# Patient Record
Sex: Male | Born: 1955 | Race: White | Hispanic: No | Smoking: Current every day smoker
Health system: Southern US, Community
[De-identification: ages and names within clinical notes are randomized; demographics above are authoritative.]

## PROBLEM LIST (undated history)

## (undated) DIAGNOSIS — E785 Hyperlipidemia, unspecified: Secondary | ICD-10-CM

## (undated) DIAGNOSIS — Z8719 Personal history of other diseases of the digestive system: Secondary | ICD-10-CM

## (undated) DIAGNOSIS — I10 Essential (primary) hypertension: Secondary | ICD-10-CM

## (undated) DIAGNOSIS — Z720 Tobacco use: Secondary | ICD-10-CM

## (undated) DIAGNOSIS — Z87891 Personal history of nicotine dependence: Secondary | ICD-10-CM

## (undated) DIAGNOSIS — Z951 Presence of aortocoronary bypass graft: Secondary | ICD-10-CM

## (undated) DIAGNOSIS — I2510 Atherosclerotic heart disease of native coronary artery without angina pectoris: Secondary | ICD-10-CM

## (undated) DIAGNOSIS — T82855A Stenosis of coronary artery stent, initial encounter: Secondary | ICD-10-CM

## (undated) DIAGNOSIS — N529 Male erectile dysfunction, unspecified: Secondary | ICD-10-CM

## (undated) DIAGNOSIS — Z87898 Personal history of other specified conditions: Secondary | ICD-10-CM

## (undated) DIAGNOSIS — I2102 ST elevation (STEMI) myocardial infarction involving left anterior descending coronary artery: Secondary | ICD-10-CM

## (undated) HISTORY — DX: Stenosis of coronary artery stent, initial encounter: T82.855A

## (undated) HISTORY — DX: Personal history of other diseases of the digestive system: Z87.19

## (undated) HISTORY — PX: NM MYOVIEW LTD: HXRAD82

## (undated) HISTORY — DX: Personal history of nicotine dependence: Z87.891

## (undated) HISTORY — DX: Presence of aortocoronary bypass graft: Z95.1

## (undated) HISTORY — DX: Atherosclerotic heart disease of native coronary artery without angina pectoris: I25.10

## (undated) HISTORY — DX: Personal history of other specified conditions: Z87.898

## (undated) HISTORY — DX: ST elevation (STEMI) myocardial infarction involving left anterior descending coronary artery: I21.02

## (undated) HISTORY — DX: Male erectile dysfunction, unspecified: N52.9

## (undated) HISTORY — DX: Hyperlipidemia, unspecified: E78.5

## (undated) HISTORY — DX: Tobacco use: Z72.0

---

## 2003-01-03 HISTORY — PX: COLONOSCOPY: SHX174

## 2003-03-24 ENCOUNTER — Encounter: Admission: RE | Admit: 2003-03-24 | Discharge: 2003-03-24 | Payer: Self-pay | Admitting: Family Medicine

## 2003-10-03 DIAGNOSIS — I2510 Atherosclerotic heart disease of native coronary artery without angina pectoris: Secondary | ICD-10-CM

## 2003-10-03 DIAGNOSIS — I2102 ST elevation (STEMI) myocardial infarction involving left anterior descending coronary artery: Secondary | ICD-10-CM

## 2003-10-03 HISTORY — DX: ST elevation (STEMI) myocardial infarction involving left anterior descending coronary artery: I21.02

## 2003-10-03 HISTORY — DX: Atherosclerotic heart disease of native coronary artery without angina pectoris: I25.10

## 2003-10-26 ENCOUNTER — Inpatient Hospital Stay (HOSPITAL_COMMUNITY): Admission: EM | Admit: 2003-10-26 | Discharge: 2003-10-29 | Payer: Self-pay | Admitting: Emergency Medicine

## 2003-10-26 HISTORY — PX: CORONARY ANGIOPLASTY WITH STENT PLACEMENT: SHX49

## 2003-11-16 ENCOUNTER — Encounter (HOSPITAL_COMMUNITY): Admission: RE | Admit: 2003-11-16 | Discharge: 2004-02-14 | Payer: Self-pay | Admitting: *Deleted

## 2004-02-15 ENCOUNTER — Encounter (HOSPITAL_COMMUNITY): Admission: RE | Admit: 2004-02-15 | Discharge: 2004-05-15 | Payer: Self-pay | Admitting: *Deleted

## 2004-04-18 ENCOUNTER — Ambulatory Visit: Payer: Self-pay | Admitting: *Deleted

## 2004-07-04 ENCOUNTER — Ambulatory Visit: Payer: Self-pay | Admitting: Cardiology

## 2004-10-13 ENCOUNTER — Ambulatory Visit: Payer: Self-pay | Admitting: Cardiology

## 2005-01-12 ENCOUNTER — Ambulatory Visit: Payer: Self-pay | Admitting: Cardiology

## 2007-03-13 ENCOUNTER — Emergency Department (HOSPITAL_COMMUNITY): Admission: EM | Admit: 2007-03-13 | Discharge: 2007-03-13 | Payer: Self-pay | Admitting: Emergency Medicine

## 2009-04-02 DIAGNOSIS — I2510 Atherosclerotic heart disease of native coronary artery without angina pectoris: Secondary | ICD-10-CM

## 2009-04-02 DIAGNOSIS — Z951 Presence of aortocoronary bypass graft: Secondary | ICD-10-CM | POA: Insufficient documentation

## 2009-04-02 HISTORY — PX: DOPPLER ECHOCARDIOGRAPHY: SHX263

## 2009-04-02 HISTORY — PX: CORONARY ARTERY BYPASS GRAFT: SHX141

## 2009-04-02 HISTORY — DX: Atherosclerotic heart disease of native coronary artery without angina pectoris: I25.10

## 2009-04-02 HISTORY — DX: Presence of aortocoronary bypass graft: Z95.1

## 2009-04-22 ENCOUNTER — Encounter: Admission: RE | Admit: 2009-04-22 | Discharge: 2009-04-22 | Payer: Self-pay | Admitting: Cardiology

## 2009-04-28 ENCOUNTER — Inpatient Hospital Stay (HOSPITAL_COMMUNITY): Admission: RE | Admit: 2009-04-28 | Discharge: 2009-04-29 | Payer: Self-pay | Admitting: Cardiology

## 2009-04-28 HISTORY — PX: CARDIAC CATHETERIZATION: SHX172

## 2009-04-29 ENCOUNTER — Ambulatory Visit: Payer: Self-pay | Admitting: Thoracic Surgery (Cardiothoracic Vascular Surgery)

## 2009-04-29 ENCOUNTER — Encounter: Payer: Self-pay | Admitting: Thoracic Surgery (Cardiothoracic Vascular Surgery)

## 2009-05-04 ENCOUNTER — Inpatient Hospital Stay (HOSPITAL_COMMUNITY)
Admission: RE | Admit: 2009-05-04 | Discharge: 2009-05-08 | Payer: Self-pay | Admitting: Thoracic Surgery (Cardiothoracic Vascular Surgery)

## 2009-05-26 ENCOUNTER — Encounter
Admission: RE | Admit: 2009-05-26 | Discharge: 2009-05-26 | Payer: Self-pay | Admitting: Thoracic Surgery (Cardiothoracic Vascular Surgery)

## 2009-05-26 ENCOUNTER — Ambulatory Visit: Payer: Self-pay | Admitting: Thoracic Surgery (Cardiothoracic Vascular Surgery)

## 2009-05-27 ENCOUNTER — Encounter (HOSPITAL_COMMUNITY): Admission: RE | Admit: 2009-05-27 | Discharge: 2009-08-25 | Payer: Self-pay | Admitting: Cardiology

## 2010-03-22 LAB — BASIC METABOLIC PANEL
BUN: 12 mg/dL (ref 6–23)
CO2: 26 mEq/L (ref 19–32)
CO2: 24 mEq/L (ref 19–32)
Chloride: 106 mEq/L (ref 96–112)
Chloride: 112 mEq/L (ref 96–112)
Chloride: 110 mEq/L (ref 96–112)
Creatinine, Ser: 1.06 mg/dL (ref 0.4–1.5)
GFR calc Af Amer: >60 mL/min (ref >60)
GFR calc Af Amer: >60 mL/min (ref >60)
GFR calc non Af Amer: >60 mL/min (ref >60)
Glucose, Bld: 103 mg/dL — ABNORMAL HIGH (ref 70–99)
Potassium: 4 mEq/L (ref 3.5–5.1)
Sodium: 141 mEq/L (ref 135–145)
Sodium: 139 mEq/L (ref 135–145)

## 2010-03-22 LAB — SURGICAL PCR SCREEN
MRSA, PCR: NEGATIVE
Staphylococcus aureus: NEGATIVE

## 2010-03-22 LAB — CBC
HCT: 36.2 % — ABNORMAL LOW (ref 39.0–52.0)
HCT: 37.4 % — ABNORMAL LOW (ref 39.0–52.0)
HCT: 37.5 % — ABNORMAL LOW (ref 39.0–52.0)
HCT: 44.8 % (ref 39.0–52.0)
Hemoglobin: 12.3 g/dL — ABNORMAL LOW (ref 13.0–17.0)
Hemoglobin: 13 g/dL (ref 13.0–17.0)
Hemoglobin: 13.3 g/dL (ref 13.0–17.0)
Hemoglobin: 15.2 g/dL (ref 13.0–17.0)
MCHC: 34.1 g/dL (ref 30.0–36.0)
MCHC: 34.8 g/dL (ref 30.0–36.0)
MCHC: 34 g/dL (ref 30.0–36.0)
MCV: 84.8 fL (ref 78.0–100.0)
MCV: 85.2 fL (ref 78.0–100.0)
MCV: 86.2 fL (ref 78.0–100.0)
MCV: 86.3 fL (ref 78.0–100.0)
MCV: 86.7 fL (ref 78.0–100.0)
MCV: 86.2 fL (ref 78.0–100.0)
Platelets: 141 10*3/uL — ABNORMAL LOW (ref 150–400)
Platelets: 146 10*3/uL — ABNORMAL LOW (ref 150–400)
Platelets: 171 10*3/uL (ref 150–400)
RBC: 4.17 MIL/uL — ABNORMAL LOW (ref 4.22–5.81)
RBC: 4.19 MIL/uL — ABNORMAL LOW (ref 4.22–5.81)
RBC: 4.19 MIL/uL — ABNORMAL LOW (ref 4.22–5.81)
RBC: 4.39 MIL/uL (ref 4.22–5.81)
RDW: 14 % (ref 11.5–15.5)
RDW: 14.1 % (ref 11.5–15.5)
WBC: 10 10*3/uL (ref 4.0–10.5)
WBC: 4.2 10*3/uL (ref 4.0–10.5)
WBC: 7.4 10*3/uL (ref 4.0–10.5)
WBC: 9.1 10*3/uL (ref 4.0–10.5)

## 2010-03-22 LAB — POCT I-STAT 3, ART BLOOD GAS (G3+)
Bicarbonate: 22.4 mEq/L (ref 20.0–24.0)
Bicarbonate: 23.9 mEq/L (ref 20.0–24.0)
O2 Saturation: 96 %
O2 Saturation: 97 %
O2 Saturation: 98 %
Patient temperature: 33.6
Patient temperature: 37.7
TCO2: 24 mmol/L (ref 0–100)
TCO2: 25 mmol/L (ref 0–100)
TCO2: 26 mmol/L (ref 0–100)
pCO2 arterial: 46.7 mmHg — ABNORMAL HIGH (ref 35.0–45.0)
pCO2 arterial: 46.8 mmHg — ABNORMAL HIGH (ref 35.0–45.0)
pH, Arterial: 7.336 — ABNORMAL LOW (ref 7.350–7.450)
pO2, Arterial: 114 mmHg — ABNORMAL HIGH (ref 80.0–100.0)

## 2010-03-22 LAB — CREATININE, SERUM
GFR calc Af Amer: >60 mL/min (ref >60)
GFR calc non Af Amer: >60 mL/min (ref >60)
GFR calc non Af Amer: >60 mL/min (ref >60)

## 2010-03-22 LAB — GLUCOSE, CAPILLARY
Glucose-Capillary: 109 mg/dL — ABNORMAL HIGH (ref 70–99)
Glucose-Capillary: 137 mg/dL — ABNORMAL HIGH (ref 70–99)

## 2010-03-22 LAB — HEPATIC FUNCTION PANEL
ALT: 33 U/L (ref 0–53)
AST: 23 U/L (ref 0–37)
Albumin: 3.5 g/dL (ref 3.5–5.2)
Bilirubin, Direct: 0.2 mg/dL (ref 0.0–0.3)
Total Protein: 6 g/dL (ref 6.0–8.3)

## 2010-03-22 LAB — POCT I-STAT, CHEM 8
BUN: 11 mg/dL (ref 6–23)
Chloride: 108 mEq/L (ref 96–112)
Hemoglobin: 13.3 g/dL (ref 13.0–17.0)
Potassium: 3.8 mEq/L (ref 3.5–5.1)
TCO2: 24 mmol/L (ref 0–100)

## 2010-03-22 LAB — POCT I-STAT 4, (NA,K, GLUC, HGB,HCT)
Glucose, Bld: 111 mg/dL — ABNORMAL HIGH (ref 70–99)
Glucose, Bld: 117 mg/dL — ABNORMAL HIGH (ref 70–99)
Glucose, Bld: 122 mg/dL — ABNORMAL HIGH (ref 70–99)
Glucose, Bld: 125 mg/dL — ABNORMAL HIGH (ref 70–99)
HCT: 40 % (ref 39.0–52.0)
HCT: 41 % (ref 39.0–52.0)
HCT: 44 % (ref 39.0–52.0)
Hemoglobin: 13.6 g/dL (ref 13.0–17.0)
Hemoglobin: 13.9 g/dL (ref 13.0–17.0)
Hemoglobin: 15 g/dL (ref 13.0–17.0)
Potassium: 4 mEq/L (ref 3.5–5.1)
Potassium: 4 mEq/L (ref 3.5–5.1)
Potassium: 4.2 mEq/L (ref 3.5–5.1)
Potassium: 4.4 mEq/L (ref 3.5–5.1)
Sodium: 139 mEq/L (ref 135–145)
Sodium: 139 mEq/L (ref 135–145)
Sodium: 139 mEq/L (ref 135–145)
Sodium: 140 mEq/L (ref 135–145)

## 2010-03-22 LAB — BLOOD GAS, ARTERIAL
Acid-base deficit: 2.5 mmol/L — ABNORMAL HIGH (ref 0.0–2.0)
Bicarbonate: 21.4 mEq/L (ref 20.0–24.0)
Drawn by: 206361
FIO2: 0.21 %
O2 Saturation: 98 %
Patient temperature: 98.6
TCO2: 22.4 mmol/L (ref 0–100)
pCO2 arterial: 34.2 mmHg — ABNORMAL LOW (ref 35.0–45.0)
pH, Arterial: 7.413 (ref 7.350–7.450)
pO2, Arterial: 98.3 mmHg (ref 80.0–100.0)

## 2010-03-22 LAB — MAGNESIUM
Magnesium: 2.5 mg/dL (ref 1.5–2.5)
Magnesium: 2.8 mg/dL — ABNORMAL HIGH (ref 1.5–2.5)

## 2010-03-22 LAB — HEMOGLOBIN A1C
Hgb A1c MFr Bld: 5.5 % (ref <5.7)
Mean Plasma Glucose: 111 mg/dL (ref <117)

## 2010-03-22 LAB — URINALYSIS, ROUTINE W REFLEX MICROSCOPIC
Bilirubin Urine: NEGATIVE
Glucose, UA: NEGATIVE mg/dL
Hgb urine dipstick: NEGATIVE
Ketones, ur: NEGATIVE mg/dL
Nitrite: NEGATIVE
Protein, ur: NEGATIVE mg/dL
Specific Gravity, Urine: 1.018 (ref 1.005–1.030)
Urobilinogen, UA: 0.2 mg/dL (ref 0.0–1.0)
pH: 7 (ref 5.0–8.0)

## 2010-03-22 LAB — TYPE AND SCREEN
ABO/RH(D): O POS
Antibody Screen: NEGATIVE

## 2010-03-22 LAB — APTT
aPTT: 26 seconds (ref 24–37)
aPTT: 33 seconds (ref 24–37)

## 2010-03-22 LAB — PROTIME-INR
INR: 1.05 (ref 0.00–1.49)
INR: 1.24 (ref 0.00–1.49)
Prothrombin Time: 13.6 seconds (ref 11.6–15.2)

## 2010-03-22 LAB — ABO/RH: ABO/RH(D): O POS

## 2010-03-22 LAB — MRSA PCR SCREENING: MRSA by PCR: NEGATIVE

## 2010-05-17 NOTE — Assessment & Plan Note (Signed)
OFFICE VISIT   ALDWIN, MICALIZZI  DOB:  14-Mar-1955                                        May 26, 2009  CHART #:  [id number]   HISTORY:  The patient is a 55 year old gentleman who underwent coronary  bypass grafting x2 on May 04, 2009.  He has a history of coronary disease  dating back to 2005.  He had stents placed at that time.  He presented  with a positive Cardiolite study.  He was found to have tight in-stent  restenosis in his LAD and moderate disease in his right coronary.  He  underwent coronary bypass grafting, off-pump on May 04, 2009.  Postoperatively, he did well without any complications.  He now returns  for a scheduled followup visit.  The patient says that he has some  discomfort primarily in the upper portion of his sternal incision and in  the right shoulder.  This is worse when he gets up in the morning and  tends to get better through the day.  He has not had any anginal-type  discomfort.  He is anxious to return to work.  He drives a dump truck.   CURRENT MEDICATIONS:  1. Aspirin 325 mg daily.  2. Metoprolol XL 25 mg daily.  3. Benazepril 20 mg daily.  4. Niaspan 1000 mg nightly.  5. Protonix 40 mg daily.  6. Simvastatin 40 mg nightly.  7. Oxycodone 5 mg tablets 1-2 p.r.n. for pain.   ALLERGIES:  He has no known drug allergies.   REVIEW OF SYSTEMS:  See HPI, otherwise negative.   PHYSICAL EXAMINATION:  General:  The patient is a 55 year old gentleman,  in no acute distress.  Vital Signs:  His blood pressure is 118/75, pulse  72, respirations are 18, and his oxygen saturation is 97% on room air.  Neurological:  He is alert and oriented x3 with no deficits.  Chest:  His sternal wound is clean, dry, and intact.  His sternum is stable.  Leg incisions are healing well with no peripheral edema.  Cardiac:  Regular rate and rhythm.  Normal S1 and S2.  No rubs or murmurs.  Lungs:  Clear with equal breath sounds bilaterally.  Extremities:   His right  shoulder has good range of motion, no deformity.   DIAGNOSTIC TESTS:  Chest x-ray shows good aeration of the lungs  bilaterally.  There are postoperative changes, but no effusions or  infiltrates.   IMPRESSION:  The patient is a 55 year old gentleman status post coronary  artery bypass grafting.  He is about 3 weeks out from surgery now.  I  cautioned him to avoid lifting any objects greater than 10 pounds for  another 3 weeks.  I think it is okay for him to start driving his  personal vehicle, but advised him not to try and drive his dump truck  for at least another 3 weeks.  His personal driving should be cautious,  as reaction time may not be 100% appropriate.  Precautions were  discussed.  I did give him a prescription for additional oxycodone 5 mg  tablets 1-2 three times daily as needed for pain.  I cautioned him not  to drive while using the oxycodone.  I have encouraged him to continue  with his exercise.  He is doing about 30 minutes a day presently.  He is  going to start cardiac rehab.  He is otherwise doing extremely well.  He  does need a followup appointment with Dr. Garen Lah.  We will help him to  set that up.  I will be happy to see the patient back anytime if I can  be of any further assistance with his care.   Salvatore Decent Dorris Fetch, M.D.  Electronically Signed   SCH/MEDQ  D:  05/26/2009  T:  05/27/2009  Job:  [id number]   cc:   Sheliah Mends, MD
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
## 2010-08-17 ENCOUNTER — Emergency Department (HOSPITAL_COMMUNITY): Payer: 59

## 2010-08-17 ENCOUNTER — Encounter (HOSPITAL_COMMUNITY): Payer: Self-pay

## 2010-08-17 ENCOUNTER — Emergency Department (HOSPITAL_COMMUNITY)
Admission: EM | Admit: 2010-08-17 | Discharge: 2010-08-17 | Disposition: A | Discharge status: Home / Self Care | Payer: 59 | Attending: Emergency Medicine | Admitting: Emergency Medicine

## 2010-08-17 DIAGNOSIS — E7800 Pure hypercholesterolemia, unspecified: Secondary | ICD-10-CM | POA: Insufficient documentation

## 2010-08-17 DIAGNOSIS — R61 Generalized hyperhidrosis: Secondary | ICD-10-CM | POA: Insufficient documentation

## 2010-08-17 DIAGNOSIS — Z79899 Other long term (current) drug therapy: Secondary | ICD-10-CM | POA: Insufficient documentation

## 2010-08-17 DIAGNOSIS — I252 Old myocardial infarction: Secondary | ICD-10-CM | POA: Insufficient documentation

## 2010-08-17 DIAGNOSIS — H81399 Other peripheral vertigo, unspecified ear: Secondary | ICD-10-CM | POA: Insufficient documentation

## 2010-08-17 DIAGNOSIS — R110 Nausea: Secondary | ICD-10-CM | POA: Insufficient documentation

## 2010-08-17 DIAGNOSIS — I10 Essential (primary) hypertension: Secondary | ICD-10-CM | POA: Insufficient documentation

## 2010-08-17 HISTORY — DX: Essential (primary) hypertension: I10

## 2010-08-17 LAB — CBC
MCHC: 35.2 g/dL (ref 30.0–36.0)
MCV: 83.1 fL (ref 78.0–100.0)
Platelets: 188 10*3/uL (ref 150–400)
RDW: 13.1 % (ref 11.5–15.5)
WBC: 5.3 10*3/uL (ref 4.0–10.5)

## 2010-08-17 LAB — COMPREHENSIVE METABOLIC PANEL
ALT: 40 U/L (ref 0–53)
CO2: 22 mEq/L (ref 19–32)
Calcium: 9.4 mg/dL (ref 8.4–10.5)
Creatinine, Ser: 0.74 mg/dL (ref 0.50–1.35)
GFR calc Af Amer: >60 mL/min (ref >60)
GFR calc non Af Amer: >60 mL/min (ref >60)
Glucose, Bld: 99 mg/dL (ref 70–99)
Sodium: 136 mEq/L (ref 135–145)
Total Protein: 7.1 g/dL (ref 6.0–8.3)

## 2010-08-17 LAB — POCT I-STAT TROPONIN I: Troponin i, poc: 0 ng/mL (ref 0.00–0.08)

## 2010-08-17 LAB — DIFFERENTIAL
Basophils Absolute: 0 10*3/uL (ref 0.0–0.1)
Eosinophils Absolute: 0.1 10*3/uL (ref 0.0–0.7)
Eosinophils Relative: 1 % (ref 0–5)
Monocytes Absolute: 0.8 10*3/uL (ref 0.1–1.0)

## 2010-08-17 LAB — URINALYSIS, ROUTINE W REFLEX MICROSCOPIC
Bilirubin Urine: NEGATIVE
Leukocytes, UA: NEGATIVE
Nitrite: NEGATIVE
Specific Gravity, Urine: 1.01 (ref 1.005–1.030)
Urobilinogen, UA: 0.2 mg/dL (ref 0.0–1.0)
pH: 6 (ref 5.0–8.0)

## 2010-08-26 IMAGING — CR DG CHEST 1V
1 series · 1 of 1 positions shown · non-contrast
Comparison: 05/06/2009.

CLINICAL DATA: Post left thoracentesis.

CHEST - 1 VIEW

[w chest pa]
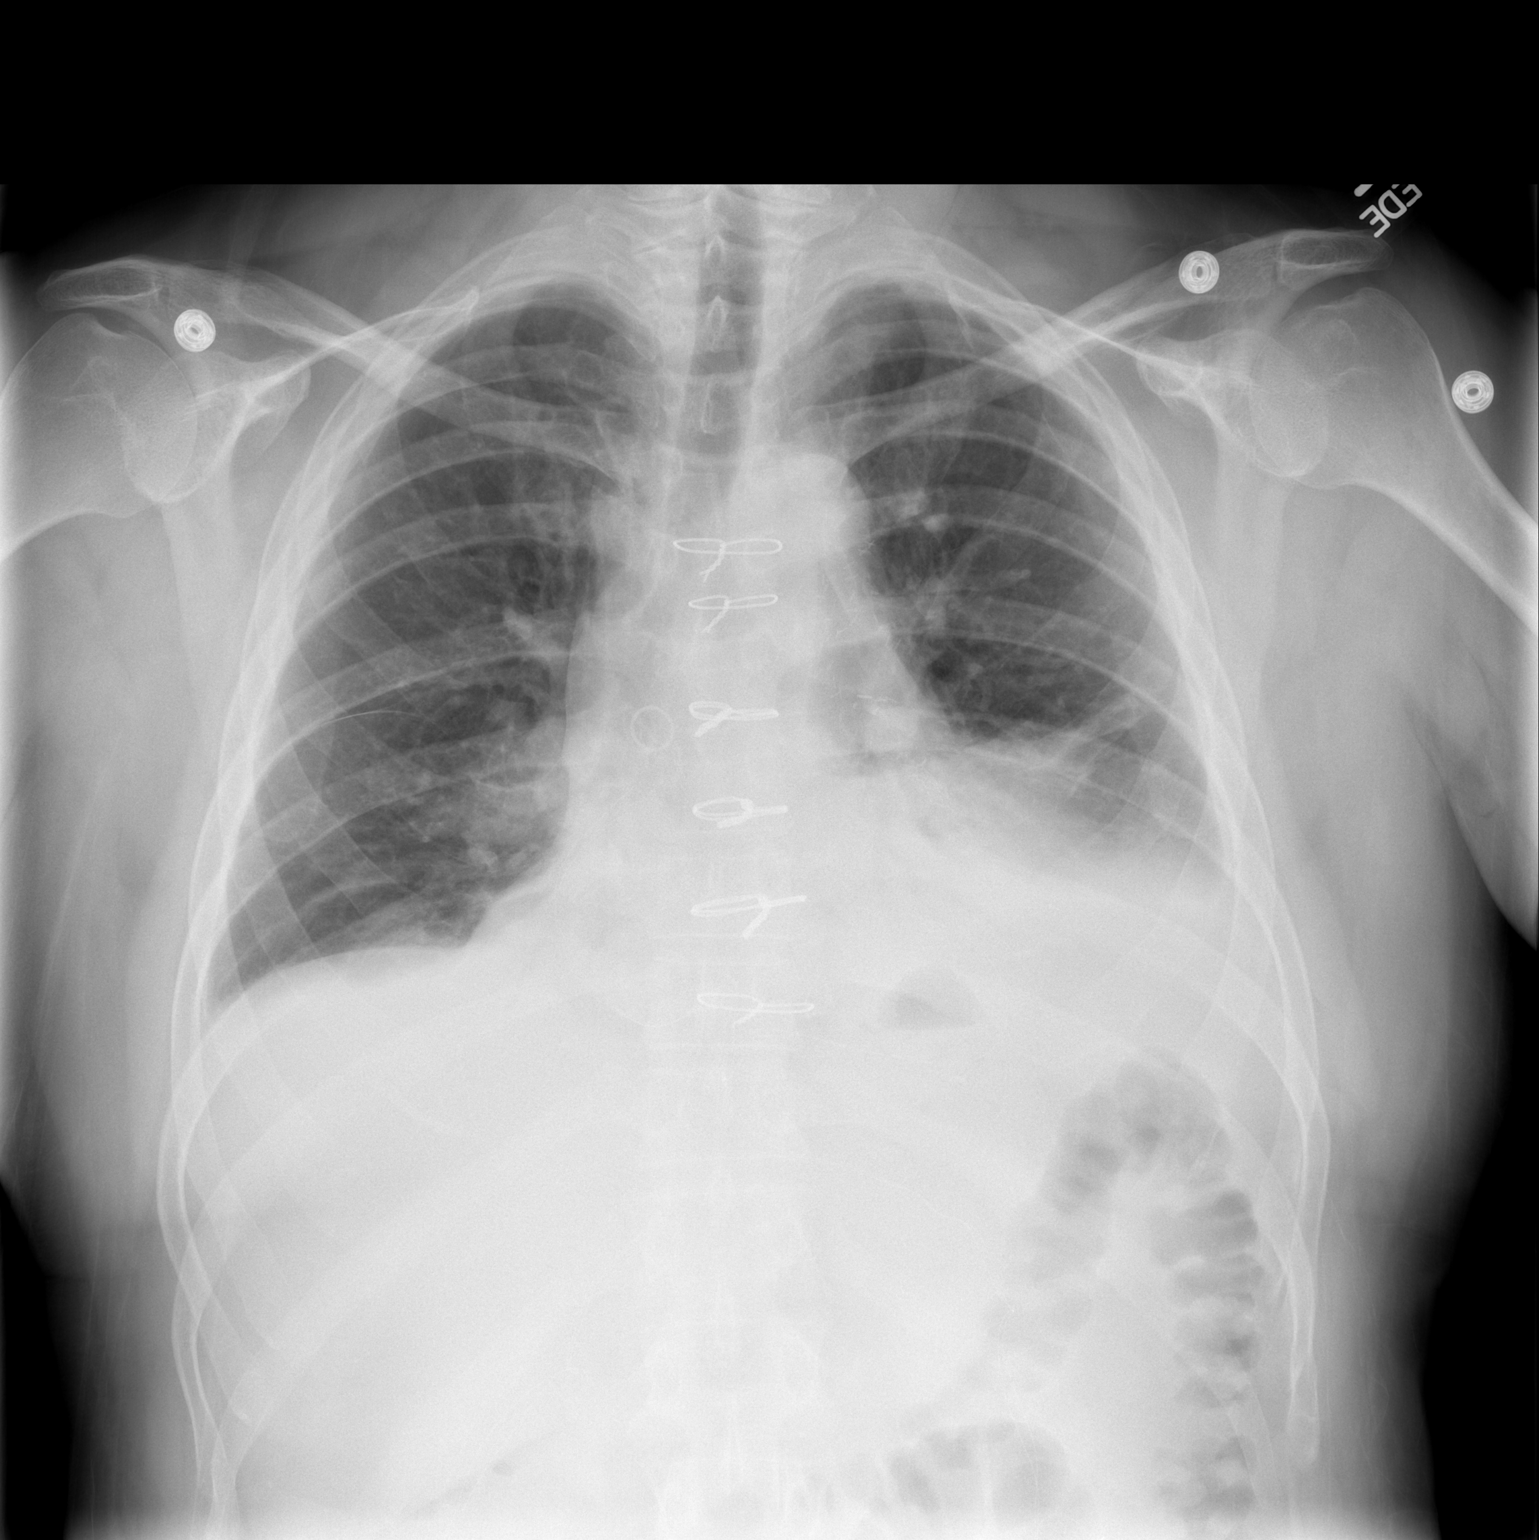

[1 of 1 positions shown; findings below may reference images not displayed]

FINDINGS: Decrease in left effusion.  There remains left pleural
effusion and left lower lobe atelectasis.  Negative for
pneumothorax.

Mild right lower lobe atelectasis is unchanged.  No significant
effusion on the right.
IMPRESSION: Decrease in left effusion post thoracentesis.  Negative for
pneumothorax.

## 2010-08-26 IMAGING — CR DG CHEST 2V
2 series · 2 of 2 positions shown · non-contrast
Comparison: Chest 05/05/2009.

CLINICAL DATA: Status post CABG.

CHEST - 2 VIEW

[w chest pa]
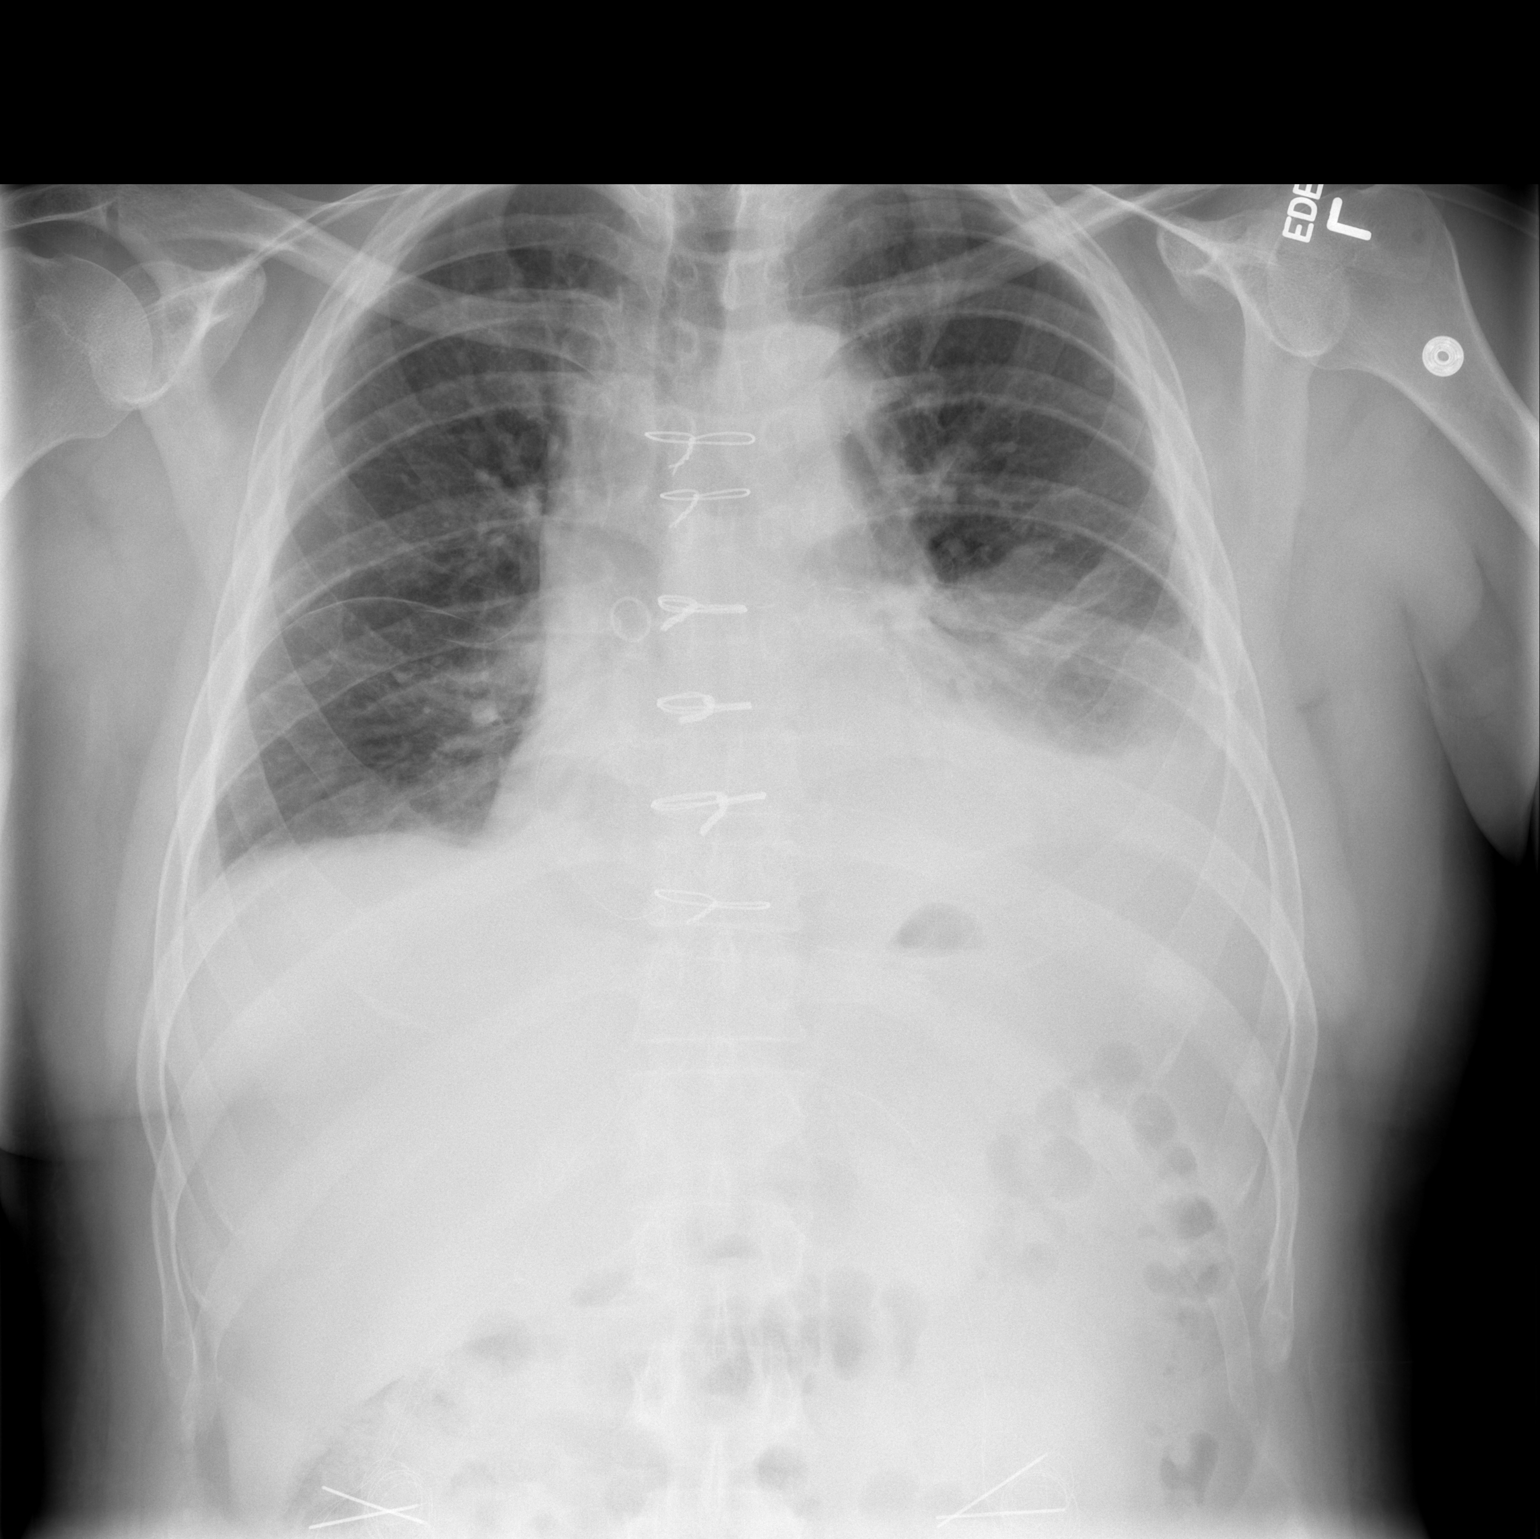

[w chest lat]
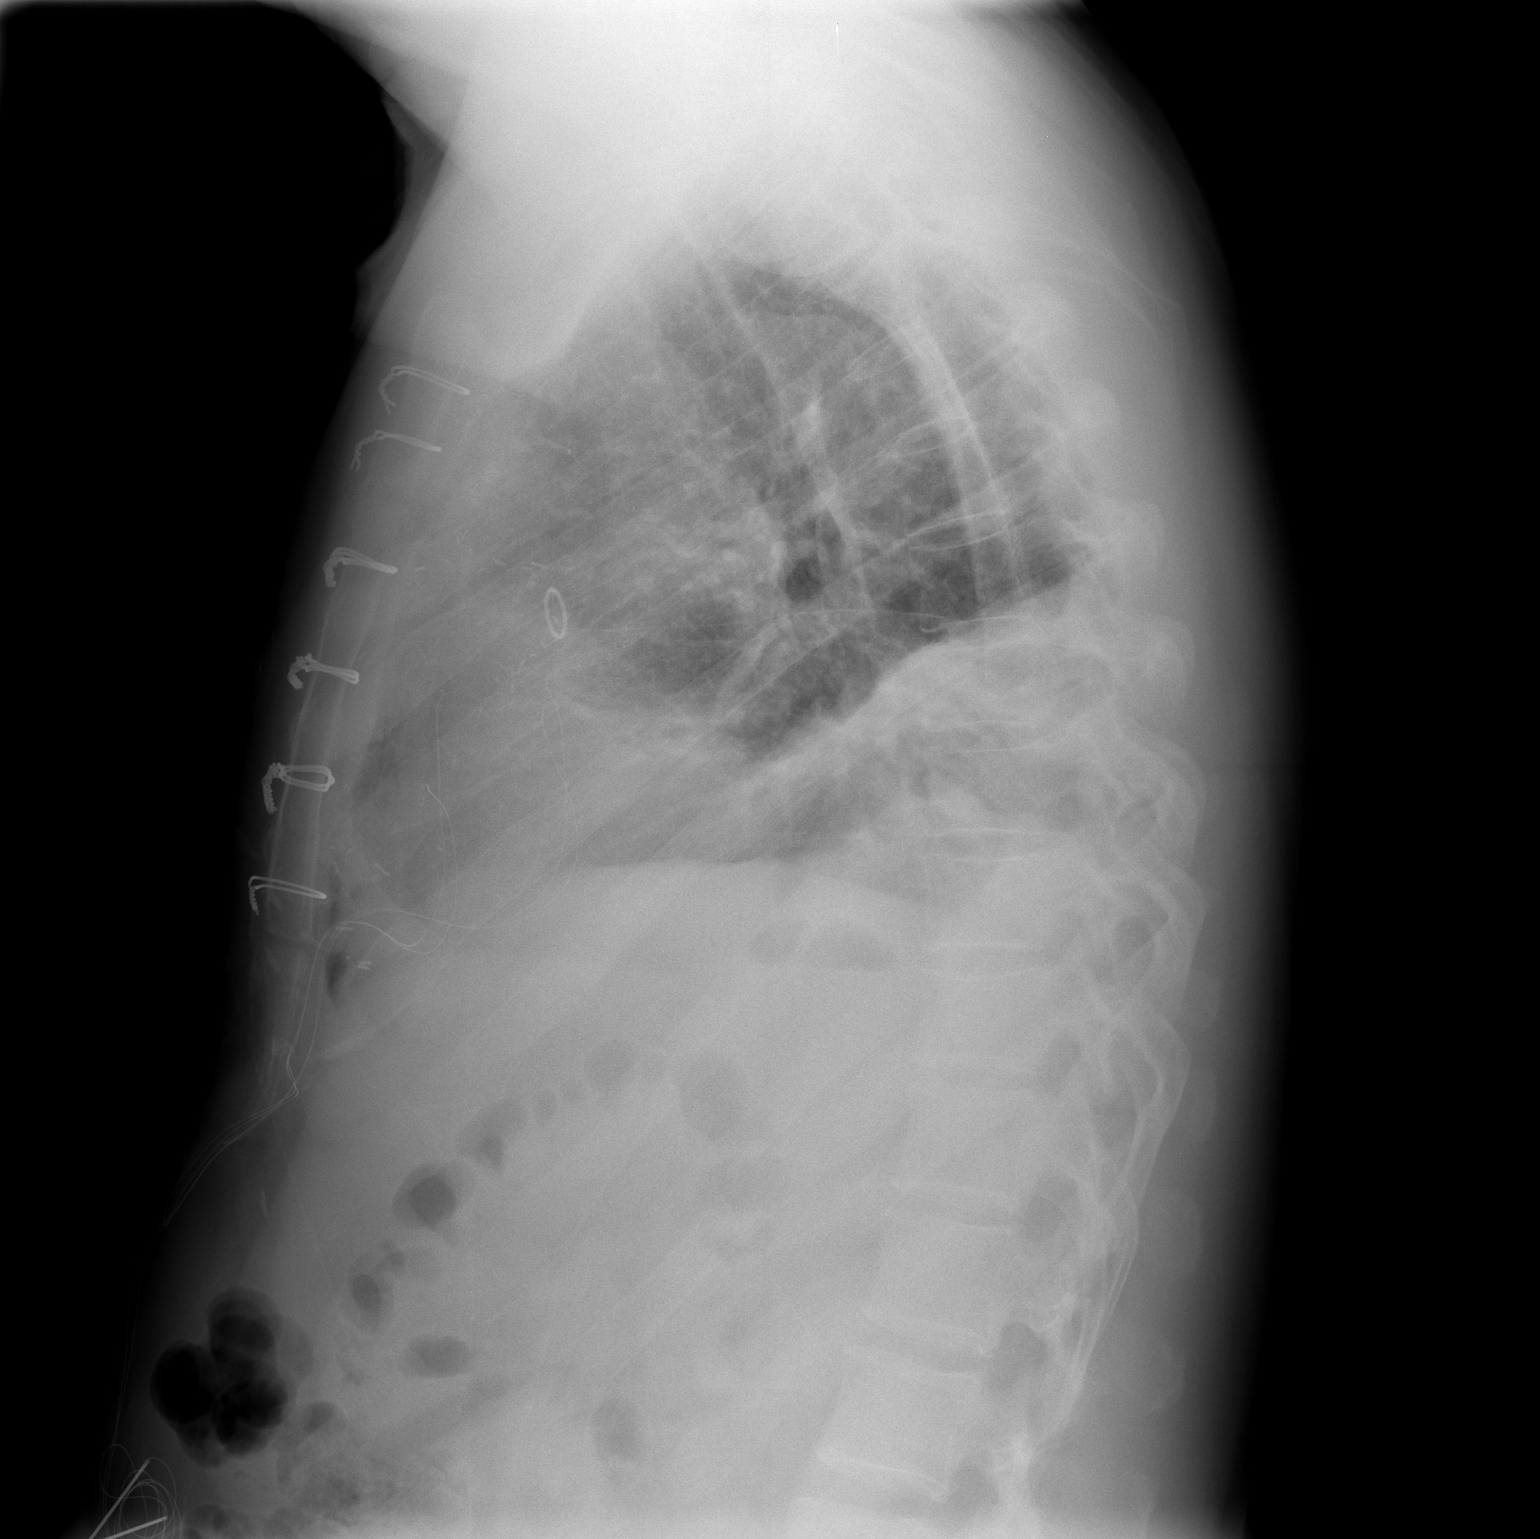

[2 of 2 positions shown; findings below may reference images not displayed]

FINDINGS: Swan-Ganz catheter, mediastinal drain and left chest tube
have all been removed.  There is no pneumothorax.  The patient has
small bilateral pleural effusions and basilar atelectasis, greater
on the left.  No pulmonary edema.  Cardiomegaly.
IMPRESSION: 1.  Support apparatus has been removed.  No pneumothorax.
2.  Small bilateral pleural effusions and basilar atelectasis,
greater on the left.

## 2010-09-15 IMAGING — CR DG CHEST 2V
2 series · 2 of 2 positions shown · non-contrast
Comparison: 05/06/2009

CLINICAL DATA: Thoracentesis.

CHEST - 2 VIEW

[w chest pa]
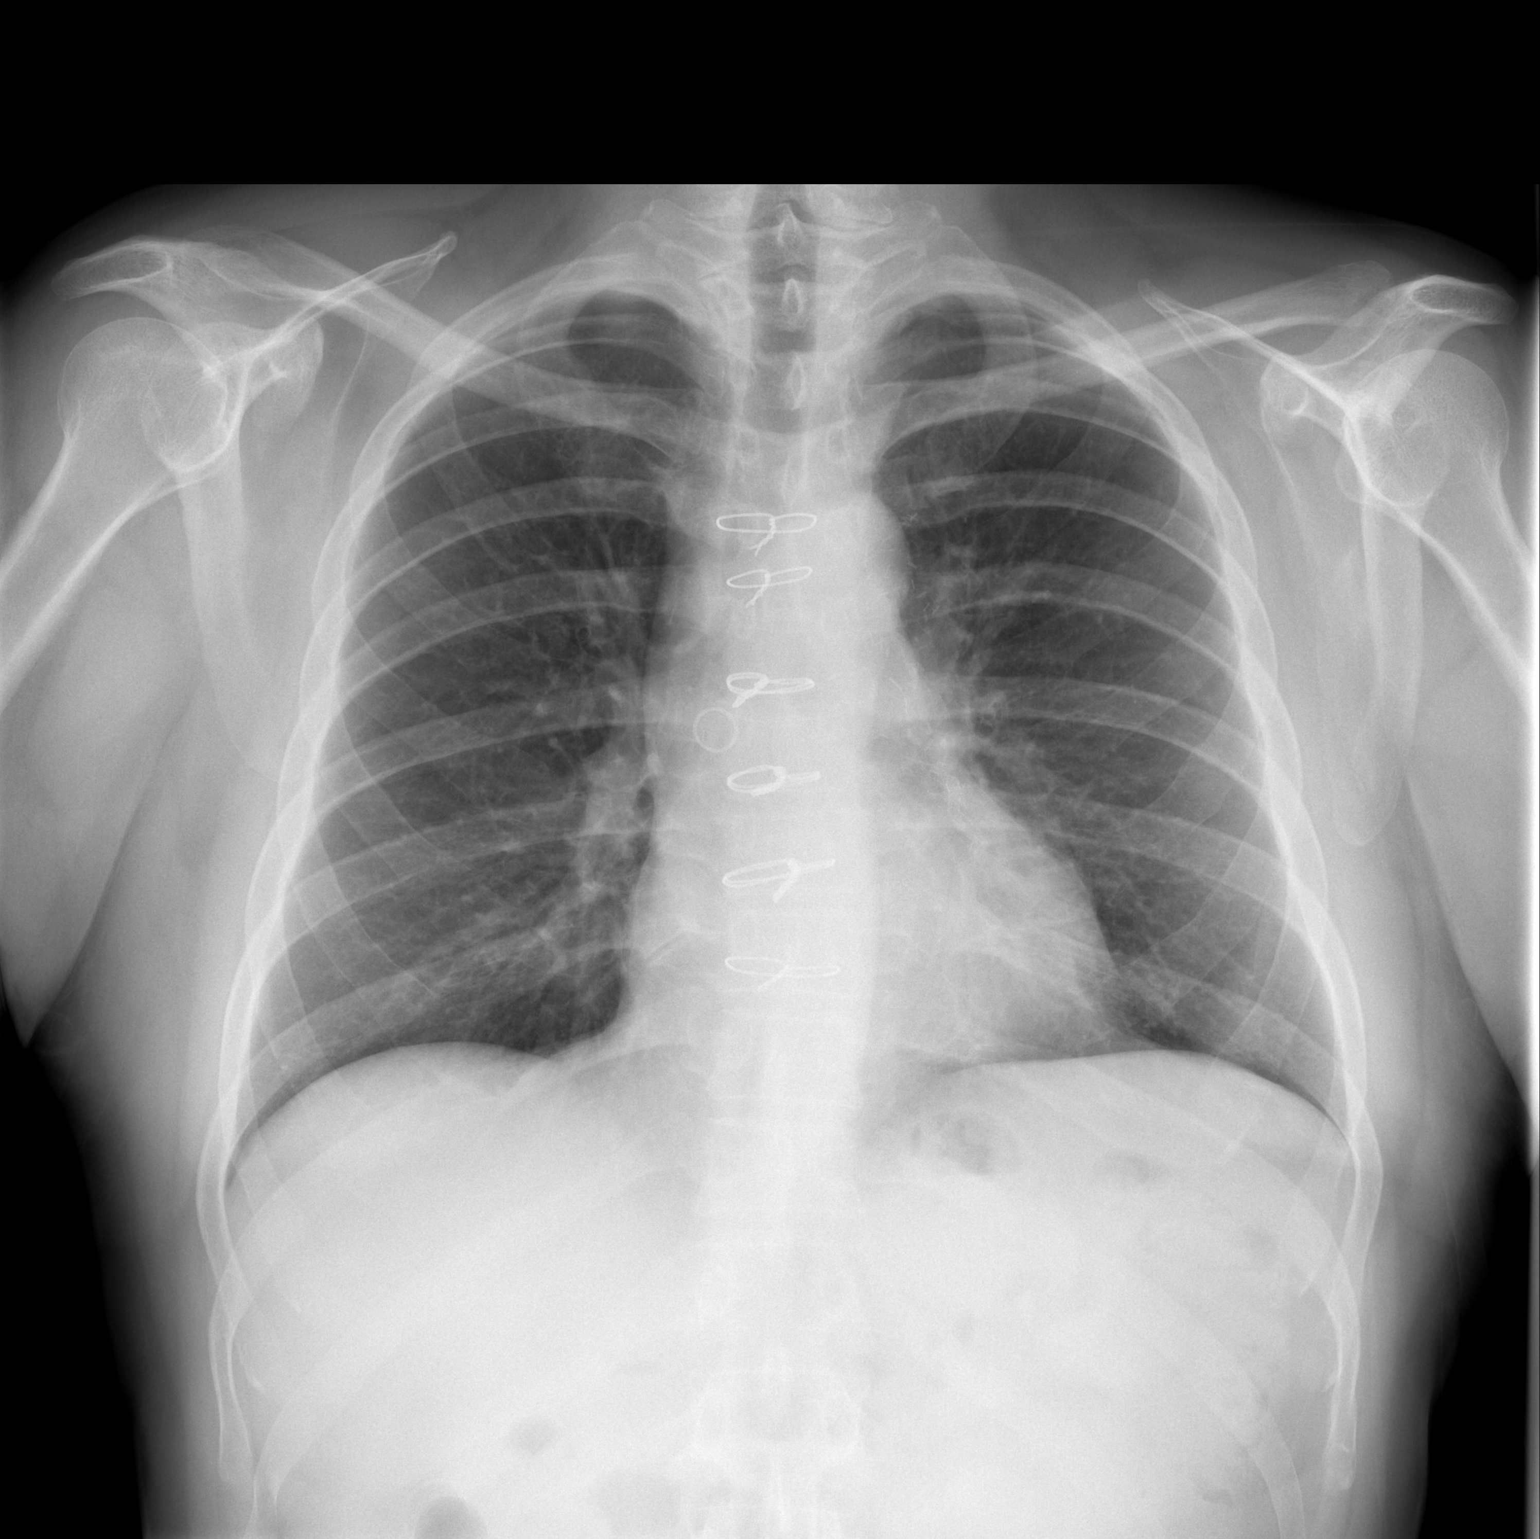

[w chest lat]
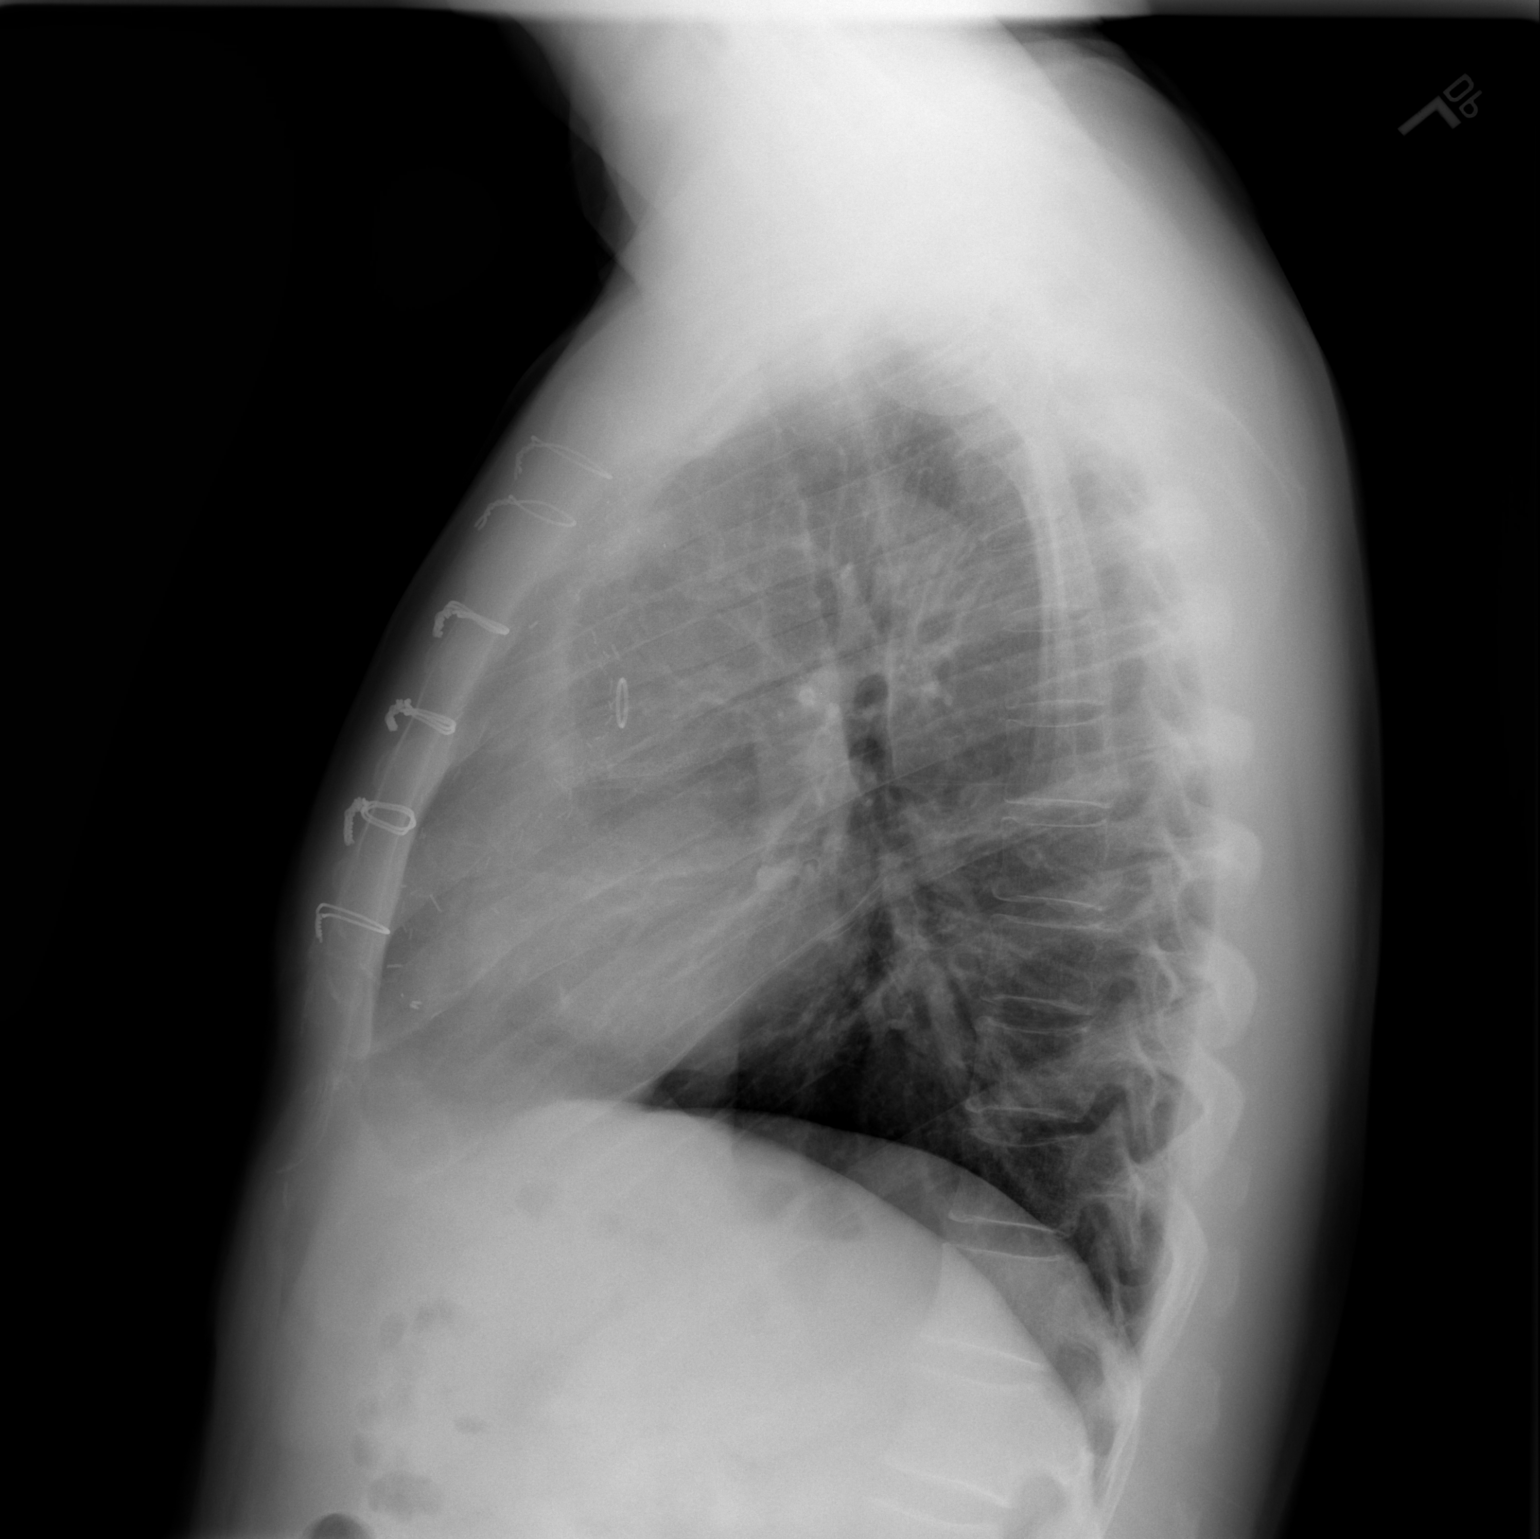

[2 of 2 positions shown; findings below may reference images not displayed]

FINDINGS: Trachea is midline.  Heart size normal.  Sternotomy wires
are stable position.  Lungs are clear.  No pleural fluid.
IMPRESSION: No acute findings.

## 2010-09-26 LAB — CBC
Platelets: 235
RDW: 13.6
WBC: 8.9

## 2010-09-26 LAB — PROTIME-INR: INR: 1

## 2010-09-26 LAB — COMPREHENSIVE METABOLIC PANEL
AST: 24
Albumin: 3.9
BUN: 14
Calcium: 9.3
Chloride: 108
Creatinine, Ser: 0.86
GFR calc Af Amer: >60
Total Bilirubin: 1.2
Total Protein: 6.7

## 2010-09-26 LAB — POCT CARDIAC MARKERS
CKMB, poc: 1.3
Myoglobin, poc: 87
Operator id: 234501
Operator id: 285841
Troponin i, poc: <0.05
Troponin i, poc: <0.05

## 2010-09-26 LAB — CK TOTAL AND CKMB (NOT AT ARMC)
CK, MB: 1.6
Relative Index: 0.8
Total CK: 193

## 2010-09-26 LAB — APTT: aPTT: 25

## 2010-09-26 LAB — DIFFERENTIAL
Basophils Absolute: 0
Lymphocytes Relative: 14
Lymphs Abs: 1.3
Monocytes Absolute: 0.6
Neutro Abs: 7

## 2010-09-26 LAB — TROPONIN I: Troponin I: <0.01

## 2010-09-26 LAB — MAGNESIUM: Magnesium: 2.1

## 2012-05-30 ENCOUNTER — Other Ambulatory Visit: Payer: Self-pay | Admitting: Cardiovascular Disease

## 2012-05-30 ENCOUNTER — Other Ambulatory Visit: Payer: Self-pay | Admitting: Cardiology

## 2012-05-30 MED ORDER — RABEPRAZOLE SODIUM 20 MG PO TBEC: 20.0000 mg | DELAYED_RELEASE_TABLET | Freq: Every day | ORAL | Status: DC

## 2012-05-30 MED ORDER — NIACIN ER (ANTIHYPERLIPIDEMIC) 1000 MG PO TBCR: 1000.0000 mg | EXTENDED_RELEASE_TABLET | Freq: Every day | ORAL | Status: DC

## 2012-08-02 ENCOUNTER — Ambulatory Visit (INDEPENDENT_AMBULATORY_CARE_PROVIDER_SITE_OTHER): Payer: BC Managed Care – PPO | Admitting: Cardiology

## 2012-08-02 ENCOUNTER — Encounter: Payer: Self-pay | Admitting: Cardiology

## 2012-08-02 VITALS — BP 116/84 | HR 72 | Ht 66.0 in | Wt 184.8 lb

## 2012-08-02 DIAGNOSIS — I2510 Atherosclerotic heart disease of native coronary artery without angina pectoris: Secondary | ICD-10-CM

## 2012-08-02 DIAGNOSIS — E782 Mixed hyperlipidemia: Secondary | ICD-10-CM

## 2012-08-02 DIAGNOSIS — I10 Essential (primary) hypertension: Secondary | ICD-10-CM

## 2012-08-02 DIAGNOSIS — N529 Male erectile dysfunction, unspecified: Secondary | ICD-10-CM

## 2012-08-02 DIAGNOSIS — Z79899 Other long term (current) drug therapy: Secondary | ICD-10-CM

## 2012-08-02 DIAGNOSIS — E785 Hyperlipidemia, unspecified: Secondary | ICD-10-CM

## 2012-08-02 DIAGNOSIS — Z951 Presence of aortocoronary bypass graft: Secondary | ICD-10-CM

## 2012-08-02 MED ORDER — METOPROLOL SUCCINATE ER 50 MG PO TB24: 50.0000 mg | ORAL_TABLET | Freq: Every day | ORAL | Status: DC

## 2012-08-02 MED ORDER — RABEPRAZOLE SODIUM 20 MG PO TBEC: DELAYED_RELEASE_TABLET | ORAL | Status: DC

## 2012-08-02 MED ORDER — BENAZEPRIL HCL 20 MG PO TABS: 20.0000 mg | ORAL_TABLET | Freq: Every day | ORAL | Status: DC

## 2012-08-02 MED ORDER — SIMVASTATIN 40 MG PO TABS: 40.0000 mg | ORAL_TABLET | Freq: Every evening | ORAL | Status: DC

## 2012-08-02 MED ORDER — SILDENAFIL CITRATE 100 MG PO TABS: 100.0000 mg | ORAL_TABLET | Freq: Every day | ORAL | Status: DC | PRN

## 2012-08-02 MED ORDER — NIACIN ER (ANTIHYPERLIPIDEMIC) 1000 MG PO TBCR: EXTENDED_RELEASE_TABLET | ORAL | Status: DC

## 2012-08-02 NOTE — Patient Instructions (Addendum)
Continue with current medications    Labs cmp,lipid  Your physician wants you to follow-up in May 2015 Dr Herbie Baltimore.  You will receive a reminder letter in the mail two months in advance. If you don't receive a letter, please call our office to schedule the follow-up appointment.

## 2012-08-09 LAB — COMPREHENSIVE METABOLIC PANEL
ALT: 31 U/L (ref 0–53)
AST: 26 U/L (ref 0–37)
CO2: 23 mEq/L (ref 19–32)
Calcium: 9.5 mg/dL (ref 8.4–10.5)
Chloride: 106 mEq/L (ref 96–112)
Creat: 1.08 mg/dL (ref 0.50–1.35)
Sodium: 138 mEq/L (ref 135–145)
Total Protein: 7.1 g/dL (ref 6.0–8.3)

## 2012-08-09 LAB — LIPID PANEL
Cholesterol: 119 mg/dL (ref 0–200)
Total CHOL/HDL Ratio: 3.1 Ratio
VLDL: 22 mg/dL (ref 0–40)

## 2012-08-16 ENCOUNTER — Encounter: Payer: Self-pay | Admitting: *Deleted

## 2012-08-22 ENCOUNTER — Encounter: Payer: Self-pay | Admitting: Cardiology

## 2012-08-22 ENCOUNTER — Telehealth: Payer: Self-pay | Admitting: Cardiology

## 2012-08-22 DIAGNOSIS — I2510 Atherosclerotic heart disease of native coronary artery without angina pectoris: Secondary | ICD-10-CM | POA: Insufficient documentation

## 2012-08-22 DIAGNOSIS — I10 Essential (primary) hypertension: Secondary | ICD-10-CM | POA: Insufficient documentation

## 2012-08-22 DIAGNOSIS — E785 Hyperlipidemia, unspecified: Secondary | ICD-10-CM | POA: Insufficient documentation

## 2012-08-22 DIAGNOSIS — N529 Male erectile dysfunction, unspecified: Secondary | ICD-10-CM | POA: Insufficient documentation

## 2012-08-22 NOTE — Telephone Encounter (Signed)
Returned call.  Pt informed Dr. Herbie Baltimore was out of the office last week r/t the delay and that results were normal.  Also informed he should be getting a letter w/ results any day.  Pt verbalized understanding and agreed w/ plan.

## 2012-08-22 NOTE — Telephone Encounter (Signed)
Had lab work about 2wks ago in [place] not gotten the results.

## 2012-08-22 NOTE — Progress Notes (Signed)
Patient ID: [name] Benitez, male   DOB: 20-Apr-1955, 57 y.o.   MRN: [id number] PCP: No primary provider on file.  Clinic Note: Chief Complaint  Patient presents with  . 15 month visit    no chest pain , no sob ,no edema   HPI: [name] Benitez is a 57 y.o. male with a PMH below who presents today for annual cardiac followup. I last saw him in May of 2013, prior to his DOT physical. He had a treadmill stress test done following this visit that was normal will with no evidence of ischemia. The initial plan was for to be a Myoview, however that was denied and request. The intention was to do a dual preop evaluation as well as DOT evaluation.  Interval History: Today presents for no major complaints. Doing well since his last visit, denying any chest tightness or chest pressure with rest or exertion. As was the case before, he still is lacking a little motivation to get any exercise. 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 He denies any PND, orthopnea or edema to suggest heart failure. No lightheadedness, dizziness, weakness or syncope/near syncope. He denies any notable palpitations or arrhythmias. No melena, hematochezia or hematuria. No TIA or amaurosis fugax symptoms. No claudication symptoms.  Past Medical History  Diagnosis Date  . CAD in native artery   . ST elevation (STEMI) myocardial infarction involving left anterior descending coronary artery October 2005    LAD PCI with 2 overlapping Cypher DES 3.0 mm 23 mm  . Coronary stent restenosis due to progression of disease April 2011    Subtotal occlusion of the LAD with ISR, unable to cross with wire; aneurysmal dilation of the LAD prior to stent  . 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 Dyslipidemia, goal LDL below 70   . Hypertension   . History of GI bleed     No recurrence  . History of vertigo   . History of  tobacco abuse     Quit prior to CABG  . Erectile dysfunction    Prior Cardiac Evaluation and Past Surgical History: Past Surgical History  Procedure Laterality Date  . Coronary angioplasty with stent placement  October 24th 2005    Anterior ischemia with significant reversibility (mid anterior and anteroseptal, apical anterior and apical anteroseptal  . Nm myoview ltd  April 2011    For DOT physical -- Anterior ischemia with significant reversibility (mid anterior and anteroseptal, apical anterior and apical anteroseptal  . Cardiac catheterization  April 2011    Subtotal occlusion of mid LAD with ISR/thrombosis, unable to pass --> urgent CABG  . Coronary artery bypass graft   April 2011    LIMA-LAD, SVG-RPDA  . Nm myoview ltd  June 2011    Post CABG: No ischemia, attenuation artifact and septal wall. No ischemia  . Doppler echocardiography  April 2011    EF 50-50%, borderline septal hypokinesis  . Exercise tolerance test  05/26/2011    Exercise 8 minutes, 10.1 METs, recent 99% max. Heart rate -- 163 bpm; negative Bruce treadmill stress test, no electrographic evidence of ischemia. Prolonged heart rate and blood pressure recovery and PVCs noted.   No Known Allergies  Current Outpatient Prescriptions  Medication Sig Dispense Refill  . aspirin 325 MG tablet Take 325 mg by mouth daily.      Marland Kitchen  benazepril (LOTENSIN) 20 MG tablet Take 1 tablet (20 mg total) by mouth daily.  30 tablet  11  . fish oil-omega-3 fatty acids 1000 MG capsule Take 2 g by mouth daily.      . metoprolol succinate (TOPROL-XL) 50 MG 24 hr tablet Take 1 tablet (50 mg total) by mouth daily. Take with or immediately following a meal.  30 tablet  11  . niacin (NIASPAN) 1000 MG CR tablet TAKE 1 TABLET BY MOUTH AT BEDTIME  30 tablet  11  . RABEprazole (ACIPHEX) 20 MG tablet TAKE 1 TABLET DAILY.  30 tablet  11  . simvastatin (ZOCOR) 40 MG tablet Take 1 tablet (40 mg total) by mouth every evening.  30 tablet  11  . sildenafil  (VIAGRA) 100 MG tablet Take 1 tablet (100 mg total) by mouth daily as needed for erectile dysfunction.  6 tablet  11   No current facility-administered medications for this visit.   History   Social History Narrative   Divorced father of one, grandmother 2. No longer smokes -- quit in 2005. Works for [employer] in [place]. Does not get routine activity/exercise -- Basically, he is not motivated to do any exercise. He, by himself, is admittedly lazy. Does not drink alcohol.   ROS: A comprehensive Review of Systems - Negative except Any pertinent positives above, other noncardiac below Genito-Urinary ROS: positive for - erectile dysfunction  PHYSICAL EXAM BP 116/84  Pulse 72  Ht 5\' 6"  (1.676 m)  Wt 184 lb 12.8 oz (83.825 kg)  BMI 29.84 kg/m2 General: he is a very pleasant, healthy-appearing Overweight gentleman, with 14 pound weight loss from his previous 198 pounds in May.A&O 3, pleasant mood affect. HEENT: [name] Benitez, PERRLA, EOMI, anicteric sclera Neck supple without lymphadenopathy, thyromegaly, JVD. No carotid bruit.  Heart: RRR. Normal S1, S2. No MRG. Pulses 2+ and equal throughout. Nondisplaced PMI.  Lungs CTAB, nonlabored,normal respiratory effort, good air movement.  Abdomen obese, but otherwise soft, NT/ND, NABS. No HSM  Extremities: no C/C/E.   RUE:[id number] today: Yes Rate: 72 , Rhythm: Normal sinus rhythm, normal ECG;    Recent Labs: None at the time of this visit  ASSESSMENT / PLAN:  CAD  - S/P urgent CABG x 2 for subtotal occlusion of the LAD in a stented segment that was unable to be recanalized percutaneously He sees doing quite well from a cardiac standpoint. He really had no symptoms at the time of his DOT physical the last time and a nuclear stress test then doubly post CABG. But he certainly has no anginal symptoms or heart failure symptoms.  he is on aspirin, ACE inhibitor and beta blocker as well as statin and niacin. -- These all seem to be working  well.  Plan: Monitor see him back in a year he will need another followup stress test. At that time you'll be 4 years post CABG, and would likely qualify for a followup nuclear stress test as opposed to a simple treadmill stress test. Otherwise no medical changes necessary.  Dyslipidemia, at goal LDL below 70 Previously well controlled on statin and Niaspan.  He is due for a followup check.  Plan: Lipid panel with chemistries.  Hypertension Excellent control on beta blocker and ACE inhibitor.  Plan: No change  Erectile dysfunction He reports to me that he is getting the same benefit from the 50 mg tablets of Viagra that he had been on the past. The last time he took 2 tablets, and did have  a notably improved effect  Plan: Increase Viagra prescription to 100 mg daily   Orders Placed This Encounter  Procedures  . Comp Met (CMET)    Order Specific Question:  Has the patient fasted?    Answer:  Yes  . Lipid panel    Order Specific Question:  Has the patient fasted?    Answer:  Yes  . EKG 12-Lead   Followup: One year  DAVID W. Herbie Baltimore, M.D., M.S. [facility] [address] [place], [place]  [address]  [phone] Pager # [phone]  Addendum: Labs from 08/09/2012  Sodium 138, potassium 4.7, chloride 106, bicarbonate 23, BUN 12, creatinine 1.08, calcium 9.5, glucose 103. LFTs notable for total bilirubin of 1.3 that is just above the upper limit of normal.  Cholesterol: TC 119, TG 111, HDL 39, LDL 58 -- excellent

## 2012-08-22 NOTE — Assessment & Plan Note (Signed)
Excellent control on beta blocker and ACE inhibitor.  Plan: No change 

## 2012-08-22 NOTE — Assessment & Plan Note (Signed)
He reports to me that he is getting the same benefit from the 50 mg tablets of Viagra that he had been on the past. The last time he took 2 tablets, and did have a notably improved effect  Plan: Increase Viagra prescription to 100 mg daily

## 2012-08-22 NOTE — Assessment & Plan Note (Signed)
He sees doing quite well from a cardiac standpoint. He really had no symptoms at the time of his DOT physical the last time and a nuclear stress test then doubly post CABG. But he certainly has no anginal symptoms or heart failure symptoms.  he is on aspirin, ACE inhibitor and beta blocker as well as statin and niacin. -- These all seem to be working well.  Plan: Monitor see him back in a year he will need another followup stress test. At that time you'll be 4 years post CABG, and would likely qualify for a followup nuclear stress test as opposed to a simple treadmill stress test. Otherwise no medical changes necessary.

## 2012-08-22 NOTE — Assessment & Plan Note (Addendum)
Previously well controlled on statin and Niaspan.  He is due for a followup check.  Plan: Lipid panel with chemistries.

## 2012-10-22 ENCOUNTER — Other Ambulatory Visit: Payer: Self-pay | Admitting: Cardiovascular Disease

## 2012-10-23 NOTE — Telephone Encounter (Signed)
Rx was sent to pharmacy electronically. 

## 2012-12-09 ENCOUNTER — Other Ambulatory Visit: Payer: Self-pay | Admitting: Cardiovascular Disease

## 2013-01-09 ENCOUNTER — Other Ambulatory Visit: Payer: Self-pay | Admitting: Cardiology

## 2013-01-09 ENCOUNTER — Other Ambulatory Visit: Payer: Self-pay | Admitting: *Deleted

## 2013-01-09 MED ORDER — NIACIN ER (ANTIHYPERLIPIDEMIC) 1000 MG PO TBCR: EXTENDED_RELEASE_TABLET | ORAL | Status: DC

## 2013-01-09 NOTE — Telephone Encounter (Signed)
Rx was sent to pharmacy electronically. 

## 2013-05-06 ENCOUNTER — Encounter: Payer: Self-pay | Admitting: *Deleted

## 2013-05-07 ENCOUNTER — Encounter: Payer: Self-pay | Admitting: Cardiology

## 2013-05-08 ENCOUNTER — Ambulatory Visit (INDEPENDENT_AMBULATORY_CARE_PROVIDER_SITE_OTHER): Payer: BC Managed Care – PPO | Admitting: Cardiology

## 2013-05-08 VITALS — BP 122/86 | HR 87 | Ht 66.0 in | Wt 196.2 lb

## 2013-05-08 DIAGNOSIS — E782 Mixed hyperlipidemia: Secondary | ICD-10-CM

## 2013-05-08 DIAGNOSIS — I10 Essential (primary) hypertension: Secondary | ICD-10-CM

## 2013-05-08 DIAGNOSIS — Z024 Encounter for examination for driving license: Secondary | ICD-10-CM

## 2013-05-08 DIAGNOSIS — I2510 Atherosclerotic heart disease of native coronary artery without angina pectoris: Secondary | ICD-10-CM

## 2013-05-08 DIAGNOSIS — Z0289 Encounter for other administrative examinations: Secondary | ICD-10-CM

## 2013-05-08 DIAGNOSIS — R351 Nocturia: Secondary | ICD-10-CM | POA: Insufficient documentation

## 2013-05-08 DIAGNOSIS — Z951 Presence of aortocoronary bypass graft: Secondary | ICD-10-CM

## 2013-05-08 DIAGNOSIS — Z79899 Other long term (current) drug therapy: Secondary | ICD-10-CM

## 2013-05-08 DIAGNOSIS — E785 Hyperlipidemia, unspecified: Secondary | ICD-10-CM

## 2013-05-08 NOTE — Assessment & Plan Note (Signed)
A: Pt doing well from a cardiac standpoint, completely asymptomatic  P:  - Cont ACE-I, beta blocker, ASA, statin and niacin - Set up for stress test for DOT physical

## 2013-05-08 NOTE — Progress Notes (Signed)
Patient ID: [name] Benitez, male   DOB: 1955-01-23, 58 y.o.   MRN: [id number] PCP: Stephens Shire, MD  Clinic Note: Chief Complaint  Patient presents with  . ROV 6 months    C/o occas twists ankle and has some swelling. Otherwise, no complaints.   HPI: [name] Benitez is a 58 y.o. male with a PMH of STEMI in 2005, s/p CABG in 2011 who presents today for annual cardiac followup. I last saw him in August of 2014, and no changes were made because he was stable at that time.   Interval History: Today his only complaint is urinary frequency at night. Doing well since his last visit, denying any chest tightness or chest pressure with rest or exertion. He is not exercising. 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 He denies any PND, orthopnea or edema to suggest heart failure. No lightheadedness, dizziness, weakness or syncope/near syncope. He denies any notable palpitations or arrhythmias. No melena, hematochezia or hematuria. No TIA or amaurosis fugax symptoms. No claudication symptoms.  Past Medical History  Diagnosis Date  . CAD in native artery   . ST elevation (STEMI) myocardial infarction involving left anterior descending coronary artery October 2005    LAD PCI with 2 overlapping Cypher DES 3.0 mm 23 mm  . Coronary stent restenosis due to progression of disease April 2011    Subtotal occlusion of the LAD with ISR, unable to cross with wire; aneurysmal dilation of the LAD prior to stent  . 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 Dyslipidemia, goal LDL below 70   . Hypertension   . History of GI bleed     No recurrence  . History of vertigo   . History of tobacco abuse     Quit prior to CABG  . Erectile dysfunction    Prior Cardiac Evaluation and Past Surgical History: Past Surgical History  Procedure Laterality Date  . Coronary angioplasty with stent  placement  10/26/2003    anterior ischemia with significant reversibility (mid anterior and anteroseptal, apical anterior and apical anteroseptal)  - Cypher 3x58mm stent to mid LAD and Cypher 3x72mm Cypher DES to prox LAD(Dr. Gerrie Nordmann)  . Nm myoview ltd  04/2009    For DOT physical -- Anterior ischemia with significant reversibility (mid anterior and anteroseptal, apical anterior and apical anteroseptal  . Cardiac catheterization  04/28/2009    Subtotal occlusion of mid LAD with ISR/thrombosis, unable to pass --> urgent CABG (Dr. Pecola Lawless)  . Coronary artery bypass graft  04/2009    LIMA-LAD, reverse SVG-distal RCA (Dr. Chauncey Cruel. Hendrickson)  . Nm myoview ltd  06/2009    Post CABG: No ischemia, attenuation artifact and septal wall. No ischemia  . Doppler echocardiography  04/2009    EF 50-50%, borderline septal hypokinesis  . Exercise tolerance test  05/26/2011    Exercise 8 minutes, 10.1 METs, recent 99% max. Heart rate -- 163 bpm; negative Bruce treadmill stress test, no electrographic evidence of ischemia. Prolonged heart rate and blood pressure recovery and PVCs noted.   No Known Allergies  Current Outpatient Prescriptions  Medication Sig Dispense Refill  . aspirin 325 MG tablet Take 325 mg by mouth daily.      . benazepril (LOTENSIN) 20 MG tablet Take 1 tablet (20 mg total) by mouth daily.  30 tablet  11  .  fish oil-omega-3 fatty acids 1000 MG capsule Take 2 g by mouth daily.      . metoprolol succinate (TOPROL-XL) 50 MG 24 hr tablet Take 1 tablet (50 mg total) by mouth daily. Take with or immediately following a meal.  30 tablet  11  . niacin (NIASPAN) 1000 MG CR tablet TAKE 1 TABLET BY MOUTH AT BEDTIME  30 tablet  3  . RABEprazole (ACIPHEX) 20 MG tablet TAKE 1 TABLET BY MOUTH DAILY  30 tablet  6  . sildenafil (VIAGRA) 100 MG tablet Take 1 tablet (100 mg total) by mouth daily as needed for erectile dysfunction.  6 tablet  11  . simvastatin (ZOCOR) 40 MG tablet Take 1 tablet (40 mg total) by  mouth every evening.  30 tablet  11   No current facility-administered medications for this visit.   History   Social History Narrative   Divorced father of one, grandmother 2. No longer smokes -- quit in 2005. Works for [employer] in [place]. Does not get routine activity/exercise -- Basically, he is not motivated to do any exercise. He, by himself, is admittedly lazy. Does not drink alcohol.   ROS: A comprehensive Review of Systems - Negative except Any pertinent positives above, other noncardiac below Genito-Urinary ROS: positive for - erectile dysfunction, positive for polyuria   PHYSICAL EXAM BP 122/86  Pulse 87  Ht 5\' 6"  (1.676 m)  Wt 196 lb 3.2 oz (88.996 kg)  BMI 31.68 kg/m2 General: he is a very pleasant, healthy-appearing Overweight gentleman, with 14 pound weight loss from his previous 198 pounds in May.A&O 3, pleasant mood affect. HEENT: [place]/AT, PERRLA, EOMI, anicteric sclera Neck supple without lymphadenopathy, thyromegaly, JVD. No carotid bruit.  Heart: RRR. Normal S1, S2. No MRG. Pulses 2+ and equal throughout. Nondisplaced PMI.  Lungs CTAB, nonlabored,normal respiratory effort, good air movement.  Abdomen obese, but otherwise soft, NT/ND, NABS. No HSM  Extremities: no C/C/E.   PJA:[id number] today: Yes Rate: 87 , Rhythm: Normal sinus rhythm, few PVC's, normal ECG;    Recent Labs: None at the time of this visit -- last check August 2014, excellent control per  ASSESSMENT / PLAN:  CAD  - S/P urgent CABG x 2 for subtotal occlusion of the LAD in a stented segment that was unable to be recanalized percutaneously A: Pt doing well from a cardiac standpoint, completely asymptomatic  P:  - Cont ACE-I, beta blocker, ASA, statin and niacin - Set up for stress test for DOT physical  Dyslipidemia, at goal LDL below 70 Recheck lipids today, continue statin and niacin at this time   Nocturia A: unclear etiology, but may be related to prostate, but could also  be related to blood sugar P: check A1C, have pt establish with PCP for prostate eval  CAD in native artery See above  Hypertension Excellent control on beta blocker and ACE inhibitor.  Plan: No change   Orders Placed This Encounter  Procedures  . Comprehensive metabolic panel  . Lipid panel  . Hemoglobin A1c  . EKG 12-Lead  . Exercise tolerance test    Dx DOT EXAM, cad S/P cabg    Standing Status: Future     Number of Occurrences:      Standing Expiration Date: 05/09/2014    Order Specific Question:  Where should this test be performed    Answer:  [facility]   Followup: One year   Marena Chancy. Maricela Bo, M.D. Family Medicine Resident  I seen and evaluated  the patient this afternoon along with Dr. Maricela Bo. Agree with his findings, examination, impression and recommendations. He continues to be stable cardiac standpoint. He is about 4 years out from his last Myoview, however he would prefer to avoid their financial implications of Myoview. He is due for his annual stress test regardless for his DOT physical. This will be checked along with lipid panel and chemistries. His partners risk factor assessment, we'll check a Hgb A1c as well.  Unless he has any untoward symptoms, would be fine for followup in one year.   DAVID W. Ellyn Hack, M.D., M.S. [facility] [address] [place], [place]  [address]  [phone] Pager # [phone]

## 2013-05-08 NOTE — Assessment & Plan Note (Signed)
Recheck lipids today, continue statin and niacin at this time

## 2013-05-08 NOTE — Assessment & Plan Note (Signed)
A: unclear etiology, but may be related to prostate, but could also be related to blood sugar P: check A1C, have pt establish with PCP for prostate eval

## 2013-05-08 NOTE — Patient Instructions (Addendum)
Your physician has requested that you have an exercise tolerance test. For further information please visit [email or web address]. Please also follow instruction sheet, as given.    Marland KitchenLABS CMP,LIPIDS - do not eat or drink the morning of lab work  Your physician wants you to follow-up in  6  Month Dr Ellyn Hack.  You will receive a reminder letter in the mail two months in advance. If you don't receive a letter, please call our office to schedule the follow-up appointment.

## 2013-05-09 ENCOUNTER — Encounter: Payer: Self-pay | Admitting: Cardiology

## 2013-05-09 NOTE — Assessment & Plan Note (Signed)
See above

## 2013-05-09 NOTE — Assessment & Plan Note (Signed)
Excellent control on beta blocker and ACE inhibitor.  Plan: No change

## 2013-05-09 NOTE — Progress Notes (Signed)
I personally saw the patient and examined him along with Dr. Maricela Bo.  Please see my addendum to his note.  Relatively stable cardiac standpoint. Followup in 1 year. Will have routine treadmill stress test for DOT physical. Checking lipid panel and chemistries along with hemoglobin A1c.    Leonie Man, MD

## 2013-05-14 LAB — COMPREHENSIVE METABOLIC PANEL
ALT: 31 U/L (ref 0–53)
AST: 25 U/L (ref 0–37)
Albumin: 4.4 g/dL (ref 3.5–5.2)
Alkaline Phosphatase: 60 U/L (ref 39–117)
BILIRUBIN TOTAL: 1.7 mg/dL — AB (ref 0.2–1.2)
BUN: 12 mg/dL (ref 6–23)
CO2: 22 meq/L (ref 19–32)
CREATININE: 1.03 mg/dL (ref 0.50–1.35)
Calcium: 9.8 mg/dL (ref 8.4–10.5)
Chloride: 105 mEq/L (ref 96–112)
GLUCOSE: 97 mg/dL (ref 70–99)
Potassium: 4.7 mEq/L (ref 3.5–5.3)
Sodium: 138 mEq/L (ref 135–145)
Total Protein: 7 g/dL (ref 6.0–8.3)

## 2013-05-14 LAB — LIPID PANEL
CHOL/HDL RATIO: 3.1 ratio
CHOLESTEROL: 116 mg/dL (ref 0–200)
HDL: 38 mg/dL — AB (ref >39)
LDL Cholesterol: 48 mg/dL (ref 0–99)
TRIGLYCERIDES: 152 mg/dL — AB (ref <150)
VLDL: 30 mg/dL (ref 0–40)

## 2013-05-14 LAB — HEMOGLOBIN A1C
Hgb A1c MFr Bld: 5.9 % — ABNORMAL HIGH (ref <5.7)
Mean Plasma Glucose: 123 mg/dL — ABNORMAL HIGH (ref <117)

## 2013-05-15 NOTE — Progress Notes (Signed)
Quick Note:  Lipids are pretty stable with the exception of TG up a bit.  I am a bit concerned with his Bilirubin level -- it has been climbing. Other levels are stable. Since lipids are ok - hold statin x 1 month & recheck LFTs. Forward to PCP  Leonie Man, MD  ______

## 2013-05-16 ENCOUNTER — Telehealth: Payer: Self-pay | Admitting: *Deleted

## 2013-05-16 ENCOUNTER — Encounter: Payer: Self-pay | Admitting: *Deleted

## 2013-05-16 DIAGNOSIS — R17 Unspecified jaundice: Secondary | ICD-10-CM

## 2013-05-16 DIAGNOSIS — Z79899 Other long term (current) drug therapy: Secondary | ICD-10-CM

## 2013-05-16 NOTE — Telephone Encounter (Signed)
Message copied by Raiford Simmonds on Fri May 16, 2013 11:12 AM ------      Message from: Leonie Man      Created: Thu May 15, 2013  1:45 AM       Lipids are pretty stable with the exception of TG up a bit.            I am a bit concerned with his Bilirubin level -- it has been climbing.  Other levels are stable.      Since lipids are ok - hold statin x 1 month & recheck LFTs.  Forward to PCP            Leonie Man, MD       ------

## 2013-05-16 NOTE — Telephone Encounter (Signed)
RN spoke patient.  Labs Result given . Verbalized understanding Patient is aware to hold Simvastatin for one month (until June 15,2015). Recheck LFTS per Dr Ellyn Hack.  Patient states he does not have a regular PCP at [facility].  RN mailed lab slip to patient

## 2013-05-20 ENCOUNTER — Telehealth (HOSPITAL_COMMUNITY): Payer: Self-pay | Admitting: *Deleted

## 2013-05-20 NOTE — Telephone Encounter (Signed)
RN spoke to patient.  He wanted RN to know he did not hold simvastatin until 05/18/13. RN informed patient to do lab work after 06/18/13. Patient verbalized understanding. RN informed patient letter and lab slip mailed last week.

## 2013-05-20 NOTE — Telephone Encounter (Signed)
Pt would like a call back regarding his cholesterol medication.

## 2013-05-30 ENCOUNTER — Encounter (HOSPITAL_COMMUNITY): Payer: BC Managed Care – PPO

## 2013-05-30 ENCOUNTER — Encounter (HOSPITAL_COMMUNITY): Payer: Self-pay

## 2013-06-02 ENCOUNTER — Telehealth (HOSPITAL_COMMUNITY): Payer: Self-pay

## 2013-06-04 ENCOUNTER — Ambulatory Visit (HOSPITAL_COMMUNITY)
Admission: RE | Admit: 2013-06-04 | Discharge: 2013-06-04 | Disposition: A | Discharge status: Home / Self Care | Payer: BC Managed Care – PPO | Source: Ambulatory Visit | Attending: Cardiovascular Disease | Admitting: Cardiovascular Disease

## 2013-06-04 DIAGNOSIS — I2510 Atherosclerotic heart disease of native coronary artery without angina pectoris: Secondary | ICD-10-CM

## 2013-06-04 DIAGNOSIS — Z79899 Other long term (current) drug therapy: Secondary | ICD-10-CM

## 2013-06-04 DIAGNOSIS — Z024 Encounter for examination for driving license: Secondary | ICD-10-CM

## 2013-06-04 DIAGNOSIS — Z951 Presence of aortocoronary bypass graft: Secondary | ICD-10-CM

## 2013-06-05 NOTE — Progress Notes (Signed)
Quick Note:  Normal TreadMill Exercise Tolerance Test - good exercise tolerance with no sign of ischemia.  Leonie Man, MD  Pls forward to PCP  ______

## 2013-06-06 ENCOUNTER — Telehealth: Payer: Self-pay | Admitting: *Deleted

## 2013-06-06 NOTE — Telephone Encounter (Signed)
Spoke to patient. Result given . Verbalized understanding Patient aware copy sent to Dr Marlyn Corporal

## 2013-06-06 NOTE — Telephone Encounter (Signed)
PATIENT CALLED REQUEST COPY OF GXT  RESULTS BE SENT TO EMPLOYER HE STATED HE DID NOT NEED A COPY BUT SEND TO ATTN. [place] [phone]  PT FAXED STUDY.

## 2013-06-06 NOTE — Telephone Encounter (Signed)
Message copied by Raiford Simmonds on Fri Jun 06, 2013  9:07 AM ------      Message from: Leonie Man      Created: Thu Jun 05, 2013  6:49 PM       Normal TreadMill Exercise Tolerance Test - good exercise tolerance with no sign of ischemia.            Leonie Man, MD            Pls forward to PCP       ------

## 2013-06-21 HISTORY — PX: OTHER SURGICAL HISTORY: SHX169

## 2013-06-21 LAB — HEPATIC FUNCTION PANEL
ALBUMIN: 4.4 g/dL (ref 3.5–5.2)
ALK PHOS: 69 U/L (ref 39–117)
ALT: 28 U/L (ref 0–53)
AST: 25 U/L (ref 0–37)
Bilirubin, Direct: 0.2 mg/dL (ref 0.0–0.3)
Indirect Bilirubin: 0.9 mg/dL (ref 0.2–1.2)
TOTAL PROTEIN: 7.3 g/dL (ref 6.0–8.3)
Total Bilirubin: 1.1 mg/dL (ref 0.2–1.2)

## 2013-06-24 ENCOUNTER — Telehealth: Payer: Self-pay | Admitting: *Deleted

## 2013-06-24 NOTE — Telephone Encounter (Signed)
Left mesage to patient. Result given on voice mail -looks good- normal limits, more detail call office.

## 2013-06-24 NOTE — Telephone Encounter (Signed)
Message copied by Raiford Simmonds on Tue Jun 24, 2013  3:43 PM ------      Message from: Leonie Man      Created: Mon Jun 23, 2013  9:14 PM       Looks good.  Bilirubin is better. ------

## 2013-06-25 ENCOUNTER — Telehealth: Payer: Self-pay | Admitting: Cardiology

## 2013-06-25 DIAGNOSIS — E785 Hyperlipidemia, unspecified: Secondary | ICD-10-CM

## 2013-06-25 DIAGNOSIS — Z79899 Other long term (current) drug therapy: Secondary | ICD-10-CM

## 2013-06-25 NOTE — Telephone Encounter (Signed)
Patient is returning a call from Trixie Dredge, RN from yesterday.  Has some questions.

## 2013-06-25 NOTE — Telephone Encounter (Signed)
Yes restart -- will need LFTs rechecked in ~3 months.

## 2013-06-25 NOTE — Telephone Encounter (Signed)
Patient notified to resume simvastatin and recheck labs in 3 months. He requested copy of labs be mailed to him as well. RN verified address.

## 2013-06-25 NOTE — Telephone Encounter (Signed)
RN spoke with patient and provided lab results. He has been off simvastatin and is wondering if he should resume. Will defer to Dr. Sharlyn Bologna, RN to advise.

## 2013-07-03 NOTE — Telephone Encounter (Signed)
Encounter complete. 

## 2013-07-30 ENCOUNTER — Other Ambulatory Visit: Payer: Self-pay | Admitting: *Deleted

## 2013-07-30 MED ORDER — METOPROLOL SUCCINATE ER 50 MG PO TB24: 50.0000 mg | ORAL_TABLET | Freq: Every day | ORAL | Status: DC

## 2013-08-04 ENCOUNTER — Other Ambulatory Visit: Payer: Self-pay | Admitting: *Deleted

## 2013-08-04 MED ORDER — SIMVASTATIN 40 MG PO TABS: 40.0000 mg | ORAL_TABLET | Freq: Every evening | ORAL | Status: DC

## 2013-08-27 ENCOUNTER — Other Ambulatory Visit: Payer: Self-pay

## 2013-08-27 MED ORDER — BENAZEPRIL HCL 20 MG PO TABS: 20.0000 mg | ORAL_TABLET | Freq: Every day | ORAL | Status: DC

## 2013-09-04 ENCOUNTER — Other Ambulatory Visit: Payer: Self-pay | Admitting: *Deleted

## 2013-09-04 MED ORDER — SILDENAFIL CITRATE 100 MG PO TABS: 100.0000 mg | ORAL_TABLET | Freq: Every day | ORAL | Status: DC | PRN

## 2013-10-30 ENCOUNTER — Other Ambulatory Visit: Payer: Self-pay | Admitting: Cardiology

## 2013-10-30 NOTE — Telephone Encounter (Signed)
Rx was sent to pharmacy electronically. 

## 2013-11-28 ENCOUNTER — Other Ambulatory Visit: Payer: Self-pay | Admitting: Cardiology

## 2014-01-12 ENCOUNTER — Other Ambulatory Visit: Payer: Self-pay | Admitting: Cardiology

## 2014-02-11 ENCOUNTER — Encounter: Payer: Self-pay | Admitting: Cardiology

## 2014-02-11 ENCOUNTER — Ambulatory Visit (INDEPENDENT_AMBULATORY_CARE_PROVIDER_SITE_OTHER): Payer: 59 | Admitting: Cardiology

## 2014-02-11 VITALS — BP 112/70 | HR 79 | Ht 66.0 in | Wt 201.8 lb

## 2014-02-11 DIAGNOSIS — N528 Other male erectile dysfunction: Secondary | ICD-10-CM

## 2014-02-11 DIAGNOSIS — Z951 Presence of aortocoronary bypass graft: Secondary | ICD-10-CM

## 2014-02-11 DIAGNOSIS — E785 Hyperlipidemia, unspecified: Secondary | ICD-10-CM

## 2014-02-11 DIAGNOSIS — I2510 Atherosclerotic heart disease of native coronary artery without angina pectoris: Secondary | ICD-10-CM

## 2014-02-11 DIAGNOSIS — I10 Essential (primary) hypertension: Secondary | ICD-10-CM

## 2014-02-11 MED ORDER — SILDENAFIL CITRATE 50 MG PO TABS: 50.0000 mg | ORAL_TABLET | Freq: Every day | ORAL | Status: DC | PRN

## 2014-02-11 NOTE — Patient Instructions (Signed)
CHANGE IN MEDICATION  REDUCE ASPIRIN TO 25 MG DAILY  Your physician wants you to follow-up in NOV/DEC 2016.  You will receive a reminder letter in the mail two months in advance. If you don't receive a letter, please call our office to schedule the follow-up appointment.

## 2014-02-12 ENCOUNTER — Encounter: Payer: Self-pay | Admitting: Cardiology

## 2014-02-12 NOTE — Progress Notes (Signed)
PCP: Stephens Shire, MD  Clinic Note: Chief Complaint  Patient presents with  . 9 MONTH VISIT    GXT IN 6 /2015-, LAB RESULTS---NO CHEST PAIN.,NO SOB, NO EDEMA  . Coronary Artery Disease    - s/p CABG x 2  04/2009   HPI: [name] Benitez is a 59 y.o. male with a PMH below who presents today for nine-month followup of his CAD. 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 He has been having every 2 year treadmill stress test for his DOT physical, because his insurance coverage would not pay for Myoview Tests.  He  Has done well on his GXTs in the past, and has not had significant problems.  Past Medical History  Diagnosis Date  . CAD in native artery   . ST elevation (STEMI) myocardial infarction involving left anterior descending coronary artery October 2005    LAD PCI with 2 overlapping Cypher DES 3.0 mm 23 mm  . Coronary stent restenosis due to progression of disease April 2011    Subtotal occlusion of the LAD with ISR, unable to cross with wire; aneurysmal dilation of the LAD prior to stent  . 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 Dyslipidemia, goal LDL below 70   . Hypertension   . History of GI bleed     No recurrence  . History of vertigo   . History of tobacco abuse     Quit prior to CABG  . Erectile dysfunction     Prior Cardiac Evaluation and Past Surgical History: Past Surgical History  Procedure Laterality Date  . Coronary angioplasty with stent placement  10/26/2003    anterior ischemia with significant reversibility (mid anterior and anteroseptal, apical anterior and apical anteroseptal)  - Cypher 3x88mm stent to mid LAD and Cypher 3x69mm Cypher DES to prox LAD(Dr. Gerrie Nordmann)  . Nm myoview ltd  04/2009    For DOT physical -- Anterior ischemia with significant reversibility (mid anterior and anteroseptal, apical anterior and  apical anteroseptal  . Cardiac catheterization  04/28/2009    Subtotal occlusion of mid LAD with ISR/thrombosis, unable to pass --> urgent CABG (Dr. Pecola Lawless)  . Coronary artery bypass graft  04/2009    LIMA-LAD, reverse SVG-distal RCA (Dr. Chauncey Cruel. Hendrickson)  . Nm myoview ltd  06/2009    Post CABG: No ischemia, attenuation artifact and septal wall. No ischemia  . Doppler echocardiography  04/2009    EF 50-50%, borderline septal hypokinesis  . Exercise tolerance test  June 20/15    Exercise 8:05 Min, 10.1 METs, reached 99% max. Peak HR -- 160 bpm; NEGATIVE Bruce TM GXT = no EKG evidence of ischemia. Prolonged heart rate and blood pressure recovery and PVCs noted that resolve with Exerction.    Interval History: He continues to do well from a cardiac standpoint.  He is not as active as he used to be, not in any routine exercise. He does try to walk some but not as much as he used to. He doesn't quite understand they do weight gain as noted on our charts mostly because he is wearing more clothes today. He says at home he weighed 195 pounds today as compared to 201 pounds on our scales. From a cardiac standpoint he denies chest pain or shortness of breath with rest or exertion.  No PND, orthopnea or edema. No palpitations, lightheadedness, dizziness, weakness or syncope/near syncope. No TIA/amaurosis fugax symptoms. No melena, hematochezia, hematuria, or epstaxis. No claudication. He does comment that he notes erectile dysfunction. He is concerned of any of the medications could be playing a role.  ROS: A comprehensive was performed. Review of Systems  Constitutional: Negative for weight loss (Wgt Gain!!) and malaise/fatigue.  HENT: Negative for nosebleeds.   Respiratory: Negative for cough.   Cardiovascular: Negative.  Negative for claudication.       Per HPI  Gastrointestinal: Negative for blood in stool and melena.  Genitourinary:       Erectile dysfunction - cannot maintain erection.    Musculoskeletal: Positive for joint pain. Negative for myalgias.  Endo/Heme/Allergies: Bruises/bleeds easily.  Psychiatric/Behavioral: Negative.   All other systems reviewed and are negative.   Current Outpatient Prescriptions on File Prior to Visit  Medication Sig Dispense Refill  . aspirin 325 MG tablet Take 325 mg by mouth daily.    . benazepril (LOTENSIN) 20 MG tablet Take 1 tablet (20 mg total) by mouth daily. 30 tablet 11  . fish oil-omega-3 fatty acids 1000 MG capsule Take 2 g by mouth daily.    . metoprolol succinate (TOPROL-XL) 50 MG 24 hr tablet TAKE 1 TABLET BY MOUTH EVERY DAY WITH OR IMMEDIATELY FOLLOWING A MEAL 30 tablet 5  . niacin (NIASPAN) 1000 MG CR tablet TAKE 1 TABLET BY MOUTH AT BEDTIME 30 tablet 3  . RABEprazole (ACIPHEX) 20 MG tablet TAKE 1 TABLET BY MOUTH DAILY 30 tablet 7  . sildenafil (VIAGRA) 100 MG tablet Take 1 tablet (100 mg total) by mouth daily as needed for erectile dysfunction. 6 tablet 7  . simvastatin (ZOCOR) 40 MG tablet Take 1 tablet (40 mg total) by mouth every evening. 30 tablet 6   No current facility-administered medications on file prior to visit.   No Known Allergies  SOCIAL AND FAMILY HISTORY REVIEWED IN EPIC -- No change  Wt Readings from Last 3 Encounters:  02/11/14 201 lb 12.8 oz (91.536 kg)  05/08/13 196 lb 3.2 oz (88.996 kg)  08/02/12 184 lb 12.8 oz (83.825 kg)  *  PHYSICAL EXAM BP 112/70 mmHg  Pulse 79  Ht 5\' 6"  (6.659 m)  Wt 201 lb 12.8 oz (91.536 kg)  BMI 32.59 kg/m2 General appearance: alert, cooperative, appears stated age, no distress and mildly obese Neck: no adenopathy, no carotid bruit and no JVD Lungs: clear to auscultation bilaterally, normal percussion bilaterally and non-labored Heart: regular rate and rhythm, S1&S2 normal, no murmur, click, rub or gallop; nondisplaced PMI Abdomen: soft, non-tender; bowel sounds normal; no masses,  no organomegaly; no HSM Extremities: extremities normal, atraumatic, no cyanosis,  or edema Pulses: 2+ and symmetric;  Skin: normal Neurologic: Mental status: Alert, oriented, thought content appropriate Cranial nerves: normal (II-XII grossly intact)   Adult ECG Report  Rate: 79 ;  Rhythm: normal sinus rhythm  Narrative Interpretation: Normal EKG  ASSESSMENT / PLAN: CAD  - S/P urgent CABG x 2 for subtotal occlusion of the LAD in a stented segment that was unable to be recanalized percutaneously A: continues to do well from a cardiac standpoint. No anginal symptoms. Negative GXT last summer.  P: continue ACE-I.,BB, ASA, statin, niacin  For his next stress test in 2017, he will be 6 years post CABG --> We'll order TM Myoview  Reduce ASA to 81 mg   Erectile dysfunction A: clearly has difficulty with erections. Has done well with Viagra in  the past.   Previously was on 100 mg Viagra.  P: Samples provided of Viagra along with discount card.  Samples were 50 mg tablets. If this is not sufficient, will increase to 100 mg.   Essential hypertension A: well-controlled on current medications -- BB & ACE-I P: no change   Dyslipidemia, at goal LDL below 70 Lab Results  Component Value Date   CHOL 116 05/14/2013   HDL 38* 05/14/2013   LDLCALC 48 05/14/2013   TRIG 152* 05/14/2013   CHOLHDL 3.1 05/14/2013   A :Has been well controlled on simvastatin 40 mg. P: Recheck lipid panel and CMP in May.     Orders Placed This Encounter  Procedures  . EKG 12-Lead   Meds ordered this encounter  Medications  . sildenafil (VIAGRA) 50 MG tablet    Sig: Take 1 tablet (50 mg total) by mouth daily as needed for erectile dysfunction.    Dispense:  4 tablet    Refill:  0   Followup: 9 Months -- (Nov -Dec 2016), Plan would be to order TM Myoview in 2017 (Would be 6 yrs post CABG)   Avanthika Dehnert, Leonie Green, M.D., M.S. Interventional Cardiologist   Pager # [phone]

## 2014-02-12 NOTE — Assessment & Plan Note (Addendum)
A: continues to do well from a cardiac standpoint. No anginal symptoms. Negative GXT last summer.  P: continue ACE-I.,BB, ASA, statin, niacin  For his next stress test in 2017, he will be 6 years post CABG --> We'll order TM Myoview  Reduce ASA to 81 mg

## 2014-02-12 NOTE — Assessment & Plan Note (Signed)
Lab Results  Component Value Date   CHOL 116 05/14/2013   HDL 38* 05/14/2013   LDLCALC 48 05/14/2013   TRIG 152* 05/14/2013   CHOLHDL 3.1 05/14/2013   A :Has been well controlled on simvastatin 40 mg. P: Recheck lipid panel and CMP in May.

## 2014-02-12 NOTE — Assessment & Plan Note (Signed)
A: clearly has difficulty with erections. Has done well with Viagra in the past.   Previously was on 100 mg Viagra.  P: Samples provided of Viagra along with discount card.  Samples were 50 mg tablets. If this is not sufficient, will increase to 100 mg.

## 2014-02-12 NOTE — Assessment & Plan Note (Signed)
A: well-controlled on current medications -- BB & ACE-I P: no change

## 2014-02-28 ENCOUNTER — Other Ambulatory Visit: Payer: Self-pay | Admitting: Cardiology

## 2014-03-02 NOTE — Telephone Encounter (Signed)
Rx(s) sent to pharmacy electronically.  

## 2014-03-21 ENCOUNTER — Other Ambulatory Visit: Payer: Self-pay | Admitting: Cardiology

## 2014-03-23 NOTE — Telephone Encounter (Signed)
Metoprolol & niacin refilled Simvastatin refilled 03/02/14 #30 with 11 refills

## 2014-06-13 ENCOUNTER — Other Ambulatory Visit: Payer: Self-pay | Admitting: Cardiology

## 2014-07-22 ENCOUNTER — Other Ambulatory Visit: Payer: Self-pay | Admitting: Cardiology

## 2014-07-22 NOTE — Telephone Encounter (Signed)
Rx(s) sent to pharmacy electronically.  

## 2014-09-24 ENCOUNTER — Telehealth: Payer: Self-pay | Admitting: Cardiology

## 2014-09-24 MED ORDER — METOPROLOL SUCCINATE ER 50 MG PO TB24: ORAL_TABLET | ORAL | Status: DC

## 2014-09-24 NOTE — Telephone Encounter (Signed)
°  1. Which medications need to be refilled?Metoprolol 50mg   2. Which pharmacy is medication to be sent to?[facility] in [place]   3. Do they need a 30 day or 90 day supply?30  4. Would they like a call back once the medication has been sent to the pharmacy? Yes

## 2014-09-24 NOTE — Telephone Encounter (Signed)
Refill submitted to patient's preferred pharmacy. Informed patient. Pt voiced understanding, no other stated concerns at this time.  

## 2014-11-23 ENCOUNTER — Encounter: Payer: Self-pay | Admitting: Cardiology

## 2014-11-23 ENCOUNTER — Ambulatory Visit (INDEPENDENT_AMBULATORY_CARE_PROVIDER_SITE_OTHER): Payer: 59 | Admitting: Cardiology

## 2014-11-23 VITALS — BP 114/74 | HR 84 | Ht 66.0 in | Wt 203.4 lb

## 2014-11-23 DIAGNOSIS — Z951 Presence of aortocoronary bypass graft: Secondary | ICD-10-CM

## 2014-11-23 DIAGNOSIS — I10 Essential (primary) hypertension: Secondary | ICD-10-CM

## 2014-11-23 DIAGNOSIS — N5201 Erectile dysfunction due to arterial insufficiency: Secondary | ICD-10-CM

## 2014-11-23 DIAGNOSIS — I2510 Atherosclerotic heart disease of native coronary artery without angina pectoris: Secondary | ICD-10-CM

## 2014-11-23 DIAGNOSIS — Z79899 Other long term (current) drug therapy: Secondary | ICD-10-CM

## 2014-11-23 DIAGNOSIS — E785 Hyperlipidemia, unspecified: Secondary | ICD-10-CM

## 2014-11-23 MED ORDER — BENAZEPRIL HCL 20 MG PO TABS: 20.0000 mg | ORAL_TABLET | Freq: Every day | ORAL | Status: DC

## 2014-11-23 MED ORDER — SIMVASTATIN 40 MG PO TABS: 40.0000 mg | ORAL_TABLET | Freq: Every day | ORAL | Status: DC

## 2014-11-23 MED ORDER — RABEPRAZOLE SODIUM 20 MG PO TBEC: 20.0000 mg | DELAYED_RELEASE_TABLET | Freq: Every day | ORAL | Status: DC

## 2014-11-23 MED ORDER — SILDENAFIL CITRATE 100 MG PO TABS: 100.0000 mg | ORAL_TABLET | Freq: Every day | ORAL | Status: DC | PRN

## 2014-11-23 MED ORDER — NIACIN ER (ANTIHYPERLIPIDEMIC) 1000 MG PO TBCR: 1000.0000 mg | EXTENDED_RELEASE_TABLET | Freq: Every day | ORAL | Status: DC

## 2014-11-23 MED ORDER — METOPROLOL SUCCINATE ER 50 MG PO TB24: ORAL_TABLET | ORAL | Status: DC

## 2014-11-23 NOTE — Patient Instructions (Signed)
Your physician wants you to follow-up in [place].  You will receive a reminder letter in the mail two months in advance. If you don't receive a letter, please call our office to schedule the follow-up appointment.  DUE IN MAY 2016---Your physician has requested that you have en exercise stress myoview. For further information please visit [email or web address]. Please follow instruction sheet, as given.   LABS - LIPID, CMP    If you need a refill on your cardiac medications before your next appointment, please call your pharmacy.

## 2014-11-23 NOTE — Progress Notes (Signed)
PCP: No PCP Per Patient  Clinic Note: Chief Complaint  Patient presents with  . Follow-up    pt states no chest pain no SOB no light headedness or dizziness no edema  . Coronary Artery Disease    HPI: [name] Benitez is a 59 y.o. male with a PMH below who presents today for ~ 9 month f/u for CAD-CABG.  [name] Benitez was last seen in Feb 2016. Stable -no changes.  Recent Hospitalizations: None  Studies Reviewed: none  Interval History: Doing pretty well except for recent fall - 2 months ago & injured L hand/fingers.  Lost his footing & fell backwards.  Still sore.  Hurt his back shoveling rocks.  Taking Ibuprofen x 1 week.  Now better.    No chest pain or shortness of breath with rest or exertion. No PND, orthopnea or edema. No palpitations, lightheadedness, dizziness, weakness or syncope/near syncope. No TIA/amaurosis fugax symptoms. No melena, hematochezia, hematuria, or epstaxis. No claudication.  ROS: A comprehensive was performed. Review of Systems  Constitutional: Negative for weight loss (Still gaining weight) and malaise/fatigue.  Cardiovascular: Negative for claudication.  Gastrointestinal: Negative for blood in stool and melena.  Genitourinary: Negative for hematuria.       Erectile dysfunction  Musculoskeletal: Positive for joint pain.       Hand pain from his fall  Neurological: Negative for dizziness and headaches.  Endo/Heme/Allergies: Does not bruise/bleed easily.  Psychiatric/Behavioral: Negative.   All other systems reviewed and are negative.   Past Medical History  Diagnosis Date  . CAD in native artery   . ST elevation (STEMI) myocardial infarction involving left anterior descending coronary artery [facility]) October 2005    LAD PCI with 2 overlapping Cypher DES 3.0 mm 23 mm  . Coronary stent restenosis due to progression of disease April 2011    Subtotal occlusion of the LAD with ISR, unable to cross with wire; aneurysmal dilation of the LAD  prior to stent  . 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 Dyslipidemia, goal LDL below 70   . Hypertension   . History of GI bleed     No recurrence  . History of vertigo   . History of tobacco abuse     Quit prior to CABG  . Erectile dysfunction     Past Surgical History  Procedure Laterality Date  . Coronary angioplasty with stent placement  10/26/2003    anterior ischemia with significant reversibility (mid anterior and anteroseptal, apical anterior and apical anteroseptal)  - Cypher 3x19mm stent to mid LAD and Cypher 3x60mm Cypher DES to prox LAD(Dr. Gerrie Nordmann)  . Nm myoview ltd  04/2009    For DOT physical -- Anterior ischemia with significant reversibility (mid anterior and anteroseptal, apical anterior and apical anteroseptal  . Cardiac catheterization  04/28/2009    Subtotal occlusion of mid LAD with ISR/thrombosis, unable to pass --> urgent CABG (Dr. Pecola Lawless)  . Coronary artery bypass graft  04/2009    LIMA-LAD, reverse SVG-distal RCA (Dr. Chauncey Cruel. Hendrickson)  . Nm myoview ltd  06/2009    Post CABG: No ischemia, attenuation artifact and septal wall. No ischemia  . Doppler echocardiography  04/2009    EF 50-50%, borderline septal hypokinesis  . Exercise tolerance test  June 20/15    Exercise 8:05 Min, 10.1 METs, reached 99% max. Peak HR -- 160 bpm; NEGATIVE Bruce  TM GXT = no EKG evidence of ischemia. Prolonged heart rate and blood pressure recovery and PVCs noted that resolve with Exerction.    Prior to Admission medications   Medication Sig Start Date End Date Taking? Authorizing Provider  aspirin 325 MG tablet Take 325 mg by mouth daily.    Historical Provider, MD  benazepril (LOTENSIN) 20 MG tablet TAKE 1 TABLET BY MOUTH EVERY DAY 07/22/14   Leonie Man, MD  fish oil-omega-3 fatty acids 1000 MG capsule Take 2 g by mouth daily.    Historical Provider, MD  metoprolol succinate (TOPROL-XL) 50  MG 24 hr tablet TAKE 1 TABLET BY MOUTH EVERY DAY WITH OR IMMEDIATELY FOLLOWING A MEAL 09/24/14   Leonie Man, MD  niacin (NIASPAN) 1000 MG CR tablet TAKE 1 TABLET BY MOUTH AT BEDTIME 03/23/14   Leonie Man, MD  RABEprazole (ACIPHEX) 20 MG tablet TAKE 1 TABLET BY MOUTH DAILY 06/15/14   Leonie Man, MD  sildenafil (VIAGRA) 100 MG tablet Take 1 tablet (100 mg total) by mouth daily as needed for erectile dysfunction. 09/04/13   Leonie Man, MD  sildenafil (VIAGRA) 50 MG tablet Take 1 tablet (50 mg total) by mouth daily as needed for erectile dysfunction. 02/11/14   Leonie Man, MD  simvastatin (ZOCOR) 40 MG tablet Take 1 tablet (40 mg total) by mouth daily at 6 PM. 03/02/14   Leonie Man, MD   No Known Allergies   Social History   Social History  . Marital Status: Divorced    Spouse Name: N/A  . Number of Children: 1  . Years of Education: 11   Occupational History  .     Social History Main Topics  . Smoking status: Former Smoker    Quit date: 10/26/2003  . Smokeless tobacco: None  . Alcohol Use: No  . Drug Use: No  . Sexual Activity: Not Asked   Other Topics Concern  . None   Social History Narrative   Divorced father of one, grandfather 2. No longer smokes -- quit in 2005. Works for [employer] in [place]. Does not get routine activity/exercise -- Basically, he is not motivated to do any exercise. He, by himself, is admittedly lazy. Does not drink alcohol.    Family History  Problem Relation Age of Onset  . Hyperlipidemia Father   . Cancer Maternal Grandmother   . Cancer Paternal Grandfather     Wt Readings from Last 3 Encounters:  11/23/14 203 lb 6.4 oz (92.262 kg)  02/11/14 201 lb 12.8 oz (91.536 kg)  05/08/13 196 lb 3.2 oz (88.996 kg)    PHYSICAL EXAM BP 114/74 mmHg  Pulse 84  Ht 5\' 6"  (1.676 m)  Wt 203 lb 6.4 oz (92.262 kg)  BMI 32.85 kg/m2 General appearance: alert, cooperative, appears stated age, no distress and mildly  obese Neck: no adenopathy, no carotid bruit and no JVD Lungs: clear to auscultation bilaterally, normal percussion bilaterally and non-labored Heart: regular rate and rhythm, S1&S2 normal, no murmur, click, rub or gallop; nondisplaced PMI Abdomen: soft, non-tender; bowel sounds normal; no masses, no organomegaly; no HSM Extremities: extremities normal, atraumatic, no cyanosis, or edema Pulses: 2+ and symmetric;  Skin: normal Neurologic: Mental status: Alert, oriented, thought content appropriate Cranial nerves: normal (II-XII grossly intact)   Adult ECG Report  Rate: 84  Rhythm: normal sinus rhythm; normal axis, intervals and durations  Narrative Interpretation: Normal EKG   Other studies Reviewed: Additional studies/ records that were  reviewed today include:  Recent Labs:   Lab Results  Component Value Date   CHOL 116 05/14/2013   HDL 38* 05/14/2013   LDLCALC 48 05/14/2013   TRIG 152* 05/14/2013   CHOLHDL 3.1 05/14/2013    ASSESSMENT / PLAN: Problem List Items Addressed This Visit    Essential hypertension - Primary (Chronic)    Well-controlled on current medications: Beta blocker and ace tender. No change      Relevant Medications   sildenafil (VIAGRA) 100 MG tablet   niacin (NIASPAN) 1000 MG CR tablet   metoprolol succinate (TOPROL-XL) 50 MG 24 hr tablet   benazepril (LOTENSIN) 20 MG tablet   simvastatin (ZOCOR) 40 MG tablet   Other Relevant Orders   EKG 12-Lead   Myocardial Perfusion Imaging   Lipid panel   Comprehensive metabolic panel   Erectile dysfunction (Chronic)    Not as effective benefit from Viagra      Relevant Orders   EKG 12-Lead   Myocardial Perfusion Imaging   Lipid panel   Comprehensive metabolic panel   Dyslipidemia, at goal LDL below 70 (Chronic)    Most recent labs showed well-controlled lipids. He is on simvastatin as well as omega-3 fatty acids and niacin.  Due for lipid panel to be checked now. Was actually due over the summer.       Relevant Medications   sildenafil (VIAGRA) 100 MG tablet   niacin (NIASPAN) 1000 MG CR tablet   metoprolol succinate (TOPROL-XL) 50 MG 24 hr tablet   benazepril (LOTENSIN) 20 MG tablet   simvastatin (ZOCOR) 40 MG tablet   Other Relevant Orders   EKG 12-Lead   Myocardial Perfusion Imaging   Lipid panel   Comprehensive metabolic panel   CAD  - S/P urgent CABG x 2 for subtotal occlusion of the LAD in a stented segment that was unable to be recanalized percutaneously (Chronic)    My next summer, low before years since his last Myoview stress test. 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 He did finalize treadmill stress test last year. I think for his annual follow-up next year, he would be due for a Myoview stress test. We will get this ordered prior to his DOT physical deadline, in order to confirm results.      Relevant Orders   EKG 12-Lead   Myocardial Perfusion Imaging   Lipid panel   Comprehensive metabolic panel   Atherosclerosis of native coronary artery without angina pectoris (Chronic)    Relatively stable with no active anginal symptoms.  on stable regimen of aspirin, beta blocker and ACE inhibitor along with statin. He needs to continue to stay active and exercise so you can lose back the weight is gained.       Relevant Medications   sildenafil (VIAGRA) 100 MG tablet   niacin (NIASPAN) 1000 MG CR tablet   metoprolol succinate (TOPROL-XL) 50 MG 24 hr tablet   benazepril (LOTENSIN) 20 MG tablet   simvastatin (ZOCOR) 40 MG tablet   Other Relevant Orders   EKG 12-Lead   Myocardial Perfusion Imaging   Lipid panel   Comprehensive metabolic panel    Other Visit Diagnoses    Drug therapy        Relevant Orders    Lipid panel    Comprehensive metabolic panel       Current medicines are reviewed at length with the patient today. (+/- concerns) none The following changes have been made: None  Studies  Ordered:   Orders Placed This  Encounter  Procedures  . Lipid panel  . Comprehensive metabolic panel  . Myocardial Perfusion Imaging  . EKG 12-Lead    Myoview - May Lipids - May ROV 12 months.    Leonie Man, M.D., M.S. Interventional Cardiologist   Pager # [phone]

## 2014-11-25 ENCOUNTER — Encounter: Payer: Self-pay | Admitting: Cardiology

## 2014-11-25 NOTE — Assessment & Plan Note (Signed)
Most recent labs showed well-controlled lipids. He is on simvastatin as well as omega-3 fatty acids and niacin.  Due for lipid panel to be checked now. Was actually due over the summer.

## 2014-11-25 NOTE — Assessment & Plan Note (Signed)
Relatively stable with no active anginal symptoms.  on stable regimen of aspirin, beta blocker and ACE inhibitor along with statin. He needs to continue to stay active and exercise so you can lose back the weight is gained.

## 2014-11-25 NOTE — Assessment & Plan Note (Signed)
Well-controlled on current medications: Beta blocker and ace tender. No change

## 2014-11-25 NOTE — Assessment & Plan Note (Signed)
My next summer, low before years since his last Myoview stress test. 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 He did finalize treadmill stress test last year. I think for his annual follow-up next year, he would be due for a Myoview stress test. We will get this ordered prior to his DOT physical deadline, in order to confirm results.

## 2014-11-25 NOTE — Assessment & Plan Note (Signed)
Not as effective benefit from Viagra

## 2014-12-08 ENCOUNTER — Telehealth: Payer: Self-pay | Admitting: Cardiology

## 2014-12-08 ENCOUNTER — Other Ambulatory Visit: Payer: Self-pay | Admitting: Cardiology

## 2014-12-08 NOTE — Telephone Encounter (Signed)
Routed to Dr. Ellyn Hack for [facility]

## 2014-12-08 NOTE — Telephone Encounter (Signed)
Pt called in stating that he had some blood drawn today and he wanted to know if Dr. Ellyn Hack could send over an order for him to have his Hep C done. He says that the nurse drew an extra valve of blood just in case the doctor oked this. Please f/u with the pt  Thanks

## 2014-12-08 NOTE — Telephone Encounter (Signed)
That is fine - we can check  HARDING, [name] Green, MD

## 2014-12-09 ENCOUNTER — Other Ambulatory Visit: Payer: Self-pay | Admitting: Cardiology

## 2014-12-09 DIAGNOSIS — Z1159 Encounter for screening for other viral diseases: Secondary | ICD-10-CM

## 2014-12-09 LAB — COMPREHENSIVE METABOLIC PANEL
ALT: 30 U/L (ref 9–46)
AST: 23 U/L (ref 10–35)
Albumin: 4 g/dL (ref 3.6–5.1)
Alkaline Phosphatase: 61 U/L (ref 40–115)
BUN: 13 mg/dL (ref 7–25)
CO2: 23 mmol/L (ref 20–31)
Calcium: 9.1 mg/dL (ref 8.6–10.3)
Chloride: 107 mmol/L (ref 98–110)
Creat: 0.95 mg/dL (ref 0.70–1.33)
Glucose, Bld: 105 mg/dL — ABNORMAL HIGH (ref 65–99)
Potassium: 4.2 mmol/L (ref 3.5–5.3)
SODIUM: 140 mmol/L (ref 135–146)
Total Bilirubin: 1.2 mg/dL (ref 0.2–1.2)
Total Protein: 6.7 g/dL (ref 6.1–8.1)

## 2014-12-09 LAB — LIPID PANEL
CHOL/HDL RATIO: 3.1 ratio (ref <=5.0)
Cholesterol: 112 mg/dL — ABNORMAL LOW (ref 125–200)
HDL: 36 mg/dL — ABNORMAL LOW (ref >=40)
LDL Cholesterol: 55 mg/dL (ref <130)
Triglycerides: 106 mg/dL (ref <150)
VLDL: 21 mg/dL (ref <30)

## 2014-12-09 NOTE — Telephone Encounter (Signed)
Sent order for Hep C screening to [facility].  Called and had it added to the prior labs.

## 2014-12-10 LAB — HEPATITIS C ANTIBODY: HCV Ab: NEGATIVE

## 2014-12-25 ENCOUNTER — Telehealth: Payer: Self-pay | Admitting: *Deleted

## 2014-12-25 NOTE — Telephone Encounter (Signed)
LEFT MESSAGE TO CALL BACK- LABS

## 2014-12-25 NOTE — Telephone Encounter (Signed)
-----   Message from Leonie Man, MD sent at 12/24/2014  9:15 PM EST ----- Labs look great. Lipids are stable.  Would be nice for HDL to be higher, but close to goal. Chem panel, renal Fxn & liver fxn all look good.  As usual, glucose is boderline.  Leonie Man, MD

## 2014-12-25 NOTE — Telephone Encounter (Signed)
-----   Message from Leonie Man, MD sent at 12/24/2014  9:22 PM EST ----- Negative HepC Ab - good news.  Leonie Man, MD

## 2014-12-29 ENCOUNTER — Encounter: Payer: Self-pay | Admitting: *Deleted

## 2014-12-29 NOTE — Telephone Encounter (Signed)
Spoke to patient.  LAB Result given . Verbalized understanding PATIENT REQUEST A COPY . LETTER MAIED

## 2015-04-03 HISTORY — PX: NM MYOVIEW LTD: HXRAD82

## 2015-04-14 ENCOUNTER — Telehealth (HOSPITAL_COMMUNITY): Payer: Self-pay | Admitting: *Deleted

## 2015-04-14 NOTE — Telephone Encounter (Signed)
Left message on voicemail per DPR in reference to upcoming appointment scheduled on 04/19/15 with detailed instructions given per Myocardial Perfusion Study Information Sheet for the test. LM to arrive 15 minutes early, and that it is imperative to arrive on time for appointment to keep from having the test rescheduled. If you need to cancel or reschedule your appointment, please call the office within 24 hours of your appointment. Failure to do so may result in a cancellation of your appointment, and a $50 no show fee. Phone number given for call back for any questions. Hubbard Robinson, RN

## 2015-04-19 ENCOUNTER — Encounter (HOSPITAL_COMMUNITY): Payer: BLUE CROSS/BLUE SHIELD

## 2015-04-20 ENCOUNTER — Ambulatory Visit (HOSPITAL_COMMUNITY): Payer: BLUE CROSS/BLUE SHIELD | Attending: Internal Medicine

## 2015-04-20 ENCOUNTER — Encounter (HOSPITAL_COMMUNITY): Payer: Self-pay

## 2015-04-20 DIAGNOSIS — Z951 Presence of aortocoronary bypass graft: Secondary | ICD-10-CM | POA: Diagnosis not present

## 2015-04-20 DIAGNOSIS — R9439 Abnormal result of other cardiovascular function study: Secondary | ICD-10-CM | POA: Insufficient documentation

## 2015-04-20 DIAGNOSIS — I2510 Atherosclerotic heart disease of native coronary artery without angina pectoris: Secondary | ICD-10-CM | POA: Diagnosis not present

## 2015-04-20 DIAGNOSIS — N5201 Erectile dysfunction due to arterial insufficiency: Secondary | ICD-10-CM | POA: Insufficient documentation

## 2015-04-20 DIAGNOSIS — E785 Hyperlipidemia, unspecified: Secondary | ICD-10-CM | POA: Insufficient documentation

## 2015-04-20 DIAGNOSIS — I10 Essential (primary) hypertension: Secondary | ICD-10-CM | POA: Insufficient documentation

## 2015-04-20 LAB — MYOCARDIAL PERFUSION IMAGING
CHL CUP NUCLEAR SSS: 8
CHL CUP RESTING HR STRESS: 53 {beats}/min
CSEPED: 7 min
CSEPPHR: 157 {beats}/min
Estimated workload: 8.5 METS
Exercise duration (sec): 0 s
LV dias vol: 95 mL (ref 62–150)
LVSYSVOL: 42 mL
MPHR: 160 {beats}/min
NUC STRESS TID: 0.99
Percent HR: 98 %
RATE: 0.4
RPE: 18
SDS: 3
SRS: 7

## 2015-04-20 MED ORDER — TECHNETIUM TC 99M SESTAMIBI GENERIC - CARDIOLITE
10.4000 | Freq: Once | INTRAVENOUS | Status: AC | PRN
  Administered 2015-04-20: 10 via INTRAVENOUS

## 2015-04-20 MED ORDER — TECHNETIUM TC 99M SESTAMIBI GENERIC - CARDIOLITE
32.7000 | Freq: Once | INTRAVENOUS | Status: AC | PRN
  Administered 2015-04-20: 32.7 via INTRAVENOUS

## 2015-04-21 ENCOUNTER — Ambulatory Visit (HOSPITAL_COMMUNITY): Payer: BLUE CROSS/BLUE SHIELD

## 2015-11-17 ENCOUNTER — Other Ambulatory Visit: Payer: Self-pay | Admitting: Cardiology

## 2015-11-29 ENCOUNTER — Ambulatory Visit (INDEPENDENT_AMBULATORY_CARE_PROVIDER_SITE_OTHER): Payer: BLUE CROSS/BLUE SHIELD | Admitting: Cardiology

## 2015-11-29 ENCOUNTER — Encounter: Payer: Self-pay | Admitting: Cardiology

## 2015-11-29 VITALS — BP 102/58 | HR 75 | Ht 66.0 in | Wt 188.8 lb

## 2015-11-29 DIAGNOSIS — I10 Essential (primary) hypertension: Secondary | ICD-10-CM

## 2015-11-29 DIAGNOSIS — Z951 Presence of aortocoronary bypass graft: Secondary | ICD-10-CM

## 2015-11-29 DIAGNOSIS — I2510 Atherosclerotic heart disease of native coronary artery without angina pectoris: Secondary | ICD-10-CM | POA: Diagnosis not present

## 2015-11-29 DIAGNOSIS — E785 Hyperlipidemia, unspecified: Secondary | ICD-10-CM | POA: Diagnosis not present

## 2015-11-29 NOTE — Progress Notes (Signed)
PCP: No PCP Per Patient  Clinic Note: Chief Complaint  Patient presents with  . Follow-up    pt states no chest pain no SOB, also states that his father passed away on 12-03-2015   . Coronary Artery Disease    HPI: EVALD VALASEK is a 60 y.o. male with a PMH below who presents today for annual f/u for CAD.  AKIEL MARES was last seen on 11/23/2014. He was doing well at that time with no significant symptoms. 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 This was done in April.  Recent Hospitalizations: None  Studies Reviewed:   Myoview April 2017: EF 55%. Small sized mild severity defect in the apical anterior, septal and apical wall. Low risk. No ischemia. The defect suspicious is suspicious of occult thinning as there was no regional wall motion abnormality noted. LOW RISK  His father just passed away about a month ago. He is now spending his weak nights staying with his mother until she is more stable after his death. They're trying to find a more stable long-term option with having a live-in caregiver.  Interval History: Hussam returns today doing fairly well. He had an episode of dizziness the day when he was a little dehydrated and got quite dizzy. But otherwise he really has not had any other cardiac symptoms.   Cardiac review of symptoms: No chest pain or shortness of breath with rest or exertion.  No PND, orthopnea or edema. No palpitations, lightheadedness, dizziness, weakness or syncope/near syncope. No TIA/amaurosis fugax symptoms. No melena, hematochezia, hematuria, or epstaxis. No claudication.  ROS: A comprehensive was performed. -- His weight goes up and down. Review of Systems  Constitutional: Negative.  Negative for malaise/fatigue.  HENT: Negative for congestion.   Respiratory: Negative for cough, shortness of breath and wheezing.   Cardiovascular: Negative.   Gastrointestinal: Negative for blood in stool, constipation, heartburn  and melena.  Genitourinary: Negative for hematuria.  Musculoskeletal: Positive for joint pain (Routine arthritis pains back and hips).  Skin: Negative.   Neurological: Negative for dizziness (1 episode noted in history of present illness. Otherwise none).  Endo/Heme/Allergies: Negative for environmental allergies. Does not bruise/bleed easily.  Psychiatric/Behavioral: Negative for depression and memory loss. The patient is not nervous/anxious and does not have insomnia.     Past Medical History:  Diagnosis Date  . CAD in native artery   . Coronary stent restenosis due to progression of disease April 2011   Subtotal occlusion of the LAD with ISR, unable to cross with wire; aneurysmal dilation of the LAD prior to stent  . Dyslipidemia, goal LDL below 70   . Erectile dysfunction   . History of GI bleed    No recurrence  . History of tobacco abuse    Quit prior to CABG  . History of vertigo   . Hypertension   . 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 ST elevation (STEMI) myocardial infarction involving left anterior descending coronary artery [facility]) October 2005   LAD PCI with 2 overlapping Cypher DES 3.0 mm 23 mm    Past Surgical History:  Procedure Laterality Date  . CARDIAC CATHETERIZATION  04/28/2009   Subtotal occlusion of mid LAD with ISR/thrombosis, unable to pass --> urgent CABG (Dr. Pecola Lawless)  . CORONARY ANGIOPLASTY WITH STENT PLACEMENT  10/26/2003   anterior ischemia with  significant reversibility (mid anterior and anteroseptal, apical anterior and apical anteroseptal)  - Cypher 3x64mm stent to mid LAD and Cypher 3x27mm Cypher DES to prox LAD(Dr. Gerrie Nordmann)  . CORONARY ARTERY BYPASS GRAFT  04/2009   LIMA-LAD, reverse SVG-distal RCA (Dr. Chauncey Cruel. Hendrickson)  . DOPPLER ECHOCARDIOGRAPHY  04/2009   EF 50-50%, borderline septal hypokinesis  . Exercise Tolerance Test  June 20/15   Exercise 8:05 Min,  10.1 METs, reached 99% max. Peak HR -- 160 bpm; NEGATIVE Bruce TM GXT = no EKG evidence of ischemia. Prolonged heart rate and blood pressure recovery and PVCs noted that resolve with Exerction.  Marland Kitchen NM MYOVIEW LTD  04/2009   For DOT physical -- Anterior ischemia with significant reversibility (mid anterior and anteroseptal, apical anterior and apical anteroseptal  . NM MYOVIEW LTD  06/2009   Post CABG: No ischemia, attenuation artifact and septal wall. No ischemia    Current Meds  Medication Sig  . aspirin 325 MG tablet Take 325 mg by mouth daily.  . benazepril (LOTENSIN) 20 MG tablet Take 1 tablet (20 mg total) by mouth daily.  . fish oil-omega-3 fatty acids 1000 MG capsule Take 2 g by mouth daily.  . metoprolol succinate (TOPROL-XL) 50 MG 24 hr tablet TAKE 1 TABLET BY MOUTH EVERY DAY WITH OR IMMEDIATELY FOLLOWING A MEAL  . niacin (NIASPAN) 1000 MG CR tablet Take 1 tablet (1,000 mg total) by mouth at bedtime.  . RABEprazole (ACIPHEX) 20 MG tablet TAKE 1 TABLET BY MOUTH EVERY DAY  . sildenafil (VIAGRA) 100 MG tablet Take 1 tablet (100 mg total) by mouth daily as needed for erectile dysfunction.  . simvastatin (ZOCOR) 40 MG tablet Take 1 tablet (40 mg total) by mouth daily at 6 PM.   No Known Allergies   Social History   Social History  . Marital status: Divorced    Spouse name: N/A  . Number of children: 1  . Years of education: 61   Occupational History  .  [employer]   Social History Main Topics  . Smoking status: Former Smoker    Quit date: 10/26/2003  . Smokeless tobacco: Never Used  . Alcohol use No  . Drug use: No  . Sexual activity: Not Asked   Other Topics Concern  . None   Social History Narrative   Divorced father of one, grandfather 2. No longer smokes -- quit in 2005. Works for [employer] in [place]. Does not get routine activity/exercise -- Basically, he is not motivated to do any exercise. He, by himself, is admittedly lazy. Does not  drink alcohol.    Family History  Problem Relation Age of Onset  . Hyperlipidemia Father   . Cancer Maternal Grandmother   . Cancer Paternal Grandfather     Wt Readings from Last 3 Encounters:  11/29/15 85.6 kg (188 lb 12.8 oz)  04/20/15 80.7 kg (178 lb)  11/23/14 92.3 kg (203 lb 6.4 oz)    PHYSICAL EXAM BP (!) 102/58   Pulse 75   Ht 5\' 6"  (1.676 m)   Wt 85.6 kg (188 lb 12.8 oz)   BMI 30.47 kg/m  General appearance: alert, cooperative, appears stated age, no distress and mildly obese Neck: no adenopathy, no carotid bruit and no JVD Lungs: clear to auscultation bilaterally, normal percussion bilaterally and non-labored Heart: regular rate and rhythm, S1&S2 normal, no murmur, click, rub or gallop; nondisplaced PMI Abdomen: soft, non-tender; bowel sounds normal; no masses, no organomegaly; no HSM Extremities:  extremities normal, atraumatic, no cyanosis, or edema Pulses: 2+ and symmetric;  Skin: normal Neurologic: Mental status: Alert, oriented, thought content appropriate Cranial nerves: normal (II-XII grossly intact)   Adult ECG Report  Rate: 75 ;  Rhythm: normal sinus rhythm and IRBB to be otherwise normal axis, intervals and durations.;   Narrative Interpretation: Table EKG   Other studies Reviewed: Additional studies/ records that were reviewed today include:  Recent Labs:   Lab Results  Component Value Date   CHOL 112 (L) 12/08/2014   HDL 36 (L) 12/08/2014   LDLCALC 55 12/08/2014   TRIG 106 12/08/2014   CHOLHDL 3.1 12/08/2014    ASSESSMENT / PLAN: Problem List Items Addressed This Visit    Atherosclerosis of native coronary artery without angina pectoris (Chronic)    Stable without any active anginal symptoms. Myoview with no evidence ischemia or really infarction.  On stable regimen with aspirin, beta blocker, ACE inhibitor and statin.  Abigail Butts continues to stay active with continued weight loss. His exercise time has gone down simply because he is now  having to care for his mother after work.  We'll need annual DOT physical stress test in April 2019.      CAD  - S/P urgent CABG x 2 for subtotal occlusion of the LAD in a stented segment that was unable to be recanalized percutaneously (Chronic)    He just has Myoview done last year. Would not probably need another one for at least 4 years. --This DEPENDS upon the with or not he continues DOT physicals.  He will have a treadmill stress test in the spring of 2019.      Relevant Orders   EKG 12-Lead (Completed)   Comprehensive metabolic panel   Lipid panel   Essential hypertension (Chronic)    Well-controlled GERD medications. No change.      Relevant Orders   EKG 12-Lead (Completed)   Comprehensive metabolic panel   Lipid panel   Dyslipidemia, at goal LDL below 70 - Primary (Chronic)    On statin. Due for labs to be checked now. We will go and order them to be drawn in the next month. As been well controlled. He takes fish oil in addition to his statin.      Relevant Orders   EKG 12-Lead (Completed)   Comprehensive metabolic panel   Lipid panel      Current medicines are reviewed at length with the patient today. (+/- concerns) none The following changes have been made: none  Patient Instructions  LABS -- CMP ,LIPID   NO OTHER CHANGES   Your physician wants you to follow-up in: [place] will receive a reminder letter in the mail two months in advance. If you don't receive a letter, please call our office to schedule the follow-up appointment.  If you need a refill on your cardiac medications before your next appointment, please call your pharmacy.    Studies Ordered:   Orders Placed This Encounter  Procedures  . Comprehensive metabolic panel  . Lipid panel  . EKG 12-Lead      Glenetta Hew, M.D., M.S. Interventional Cardiologist   Pager # [phone] Phone # [phone] [address] [place], [place] [address]

## 2015-11-29 NOTE — Patient Instructions (Signed)
LABS -- CMP ,LIPID   NO OTHER CHANGES   Your physician wants you to follow-up in: [place] will receive a reminder letter in the mail two months in advance. If you don't receive a letter, please call our office to schedule the follow-up appointment.  If you need a refill on your cardiac medications before your next appointment, please call your pharmacy.

## 2015-12-01 ENCOUNTER — Encounter: Payer: Self-pay | Admitting: Cardiology

## 2015-12-01 NOTE — Assessment & Plan Note (Signed)
He just has Myoview done last year. Would not probably need another one for at least 4 years. --This DEPENDS upon the with or not he continues DOT physicals.  He will have a treadmill stress test in the spring of 2019.

## 2015-12-01 NOTE — Assessment & Plan Note (Signed)
Well-controlled GERD medications. No change.

## 2015-12-01 NOTE — Assessment & Plan Note (Signed)
On statin. Due for labs to be checked now. We will go and order them to be drawn in the next month. As been well controlled. He takes fish oil in addition to his statin.

## 2015-12-01 NOTE — Assessment & Plan Note (Signed)
Stable without any active anginal symptoms. Myoview with no evidence ischemia or really infarction.  On stable regimen with aspirin, beta blocker, ACE inhibitor and statin.  [name] Benitez continues to stay active with continued weight loss. His exercise time has gone down simply because he is now having to care for his mother after work.  We'll need annual DOT physical stress test in April 2019.

## 2015-12-08 ENCOUNTER — Emergency Department (HOSPITAL_COMMUNITY)
Admission: EM | Admit: 2015-12-08 | Discharge: 2015-12-08 | Disposition: A | Discharge status: Home / Self Care | Payer: BLUE CROSS/BLUE SHIELD | Attending: Emergency Medicine | Admitting: Emergency Medicine

## 2015-12-08 ENCOUNTER — Encounter (HOSPITAL_COMMUNITY): Payer: Self-pay | Admitting: *Deleted

## 2015-12-08 ENCOUNTER — Emergency Department (HOSPITAL_COMMUNITY): Payer: BLUE CROSS/BLUE SHIELD

## 2015-12-08 DIAGNOSIS — Z7982 Long term (current) use of aspirin: Secondary | ICD-10-CM | POA: Insufficient documentation

## 2015-12-08 DIAGNOSIS — Z87891 Personal history of nicotine dependence: Secondary | ICD-10-CM | POA: Diagnosis not present

## 2015-12-08 DIAGNOSIS — R109 Unspecified abdominal pain: Secondary | ICD-10-CM | POA: Diagnosis not present

## 2015-12-08 DIAGNOSIS — Z79899 Other long term (current) drug therapy: Secondary | ICD-10-CM | POA: Diagnosis not present

## 2015-12-08 DIAGNOSIS — Z951 Presence of aortocoronary bypass graft: Secondary | ICD-10-CM | POA: Diagnosis not present

## 2015-12-08 DIAGNOSIS — I10 Essential (primary) hypertension: Secondary | ICD-10-CM | POA: Diagnosis not present

## 2015-12-08 DIAGNOSIS — I2510 Atherosclerotic heart disease of native coronary artery without angina pectoris: Secondary | ICD-10-CM | POA: Diagnosis not present

## 2015-12-08 DIAGNOSIS — R1013 Epigastric pain: Secondary | ICD-10-CM | POA: Diagnosis not present

## 2015-12-08 DIAGNOSIS — I252 Old myocardial infarction: Secondary | ICD-10-CM | POA: Diagnosis not present

## 2015-12-08 DIAGNOSIS — Z955 Presence of coronary angioplasty implant and graft: Secondary | ICD-10-CM | POA: Insufficient documentation

## 2015-12-08 DIAGNOSIS — K8020 Calculus of gallbladder without cholecystitis without obstruction: Secondary | ICD-10-CM | POA: Diagnosis not present

## 2015-12-08 DIAGNOSIS — R1010 Upper abdominal pain, unspecified: Secondary | ICD-10-CM | POA: Diagnosis not present

## 2015-12-08 LAB — COMPREHENSIVE METABOLIC PANEL
ALT: 30 U/L (ref 17–63)
AST: 32 U/L (ref 15–41)
Albumin: 4.3 g/dL (ref 3.5–5.0)
Alkaline Phosphatase: 65 U/L (ref 38–126)
Anion gap: 12 (ref 5–15)
BILIRUBIN TOTAL: 2 mg/dL — AB (ref 0.3–1.2)
BUN: 14 mg/dL (ref 6–20)
CO2: 17 mmol/L — ABNORMAL LOW (ref 22–32)
Calcium: 10.1 mg/dL (ref 8.9–10.3)
Chloride: 109 mmol/L (ref 101–111)
Creatinine, Ser: 1 mg/dL (ref 0.61–1.24)
Glucose, Bld: 128 mg/dL — ABNORMAL HIGH (ref 65–99)
Potassium: 4 mmol/L (ref 3.5–5.1)
Sodium: 138 mmol/L (ref 135–145)
Total Protein: 7.5 g/dL (ref 6.5–8.1)

## 2015-12-08 LAB — CBC
HCT: 52.8 % — ABNORMAL HIGH (ref 39.0–52.0)
Hemoglobin: 18.9 g/dL — ABNORMAL HIGH (ref 13.0–17.0)
MCH: 30.3 pg (ref 26.0–34.0)
MCHC: 35.8 g/dL (ref 30.0–36.0)
MCV: 84.6 fL (ref 78.0–100.0)
PLATELETS: 182 10*3/uL (ref 150–400)
RBC: 6.24 MIL/uL — AB (ref 4.22–5.81)
RDW: 13.3 % (ref 11.5–15.5)
WBC: 9.9 10*3/uL (ref 4.0–10.5)

## 2015-12-08 LAB — LIPASE, BLOOD: LIPASE: 26 U/L (ref 11–51)

## 2015-12-08 LAB — URINALYSIS, ROUTINE W REFLEX MICROSCOPIC
BILIRUBIN URINE: NEGATIVE
Bacteria, UA: NONE SEEN
Glucose, UA: NEGATIVE mg/dL
HGB URINE DIPSTICK: NEGATIVE
Ketones, ur: 20 mg/dL — AB
LEUKOCYTES UA: NEGATIVE
Nitrite: NEGATIVE
PH: 8 (ref 5.0–8.0)
Protein, ur: 30 mg/dL — AB
Specific Gravity, Urine: 1.024 (ref 1.005–1.030)

## 2015-12-08 LAB — I-STAT TROPONIN, ED: TROPONIN I, POC: 0 ng/mL (ref 0.00–0.08)

## 2015-12-08 MED ORDER — HYDROMORPHONE HCL 2 MG/ML IJ SOLN
1.0000 mg | Freq: Once | INTRAMUSCULAR | Status: AC
  Administered 2015-12-08: 1 mg via INTRAVENOUS
  Filled 2015-12-08: qty 1

## 2015-12-08 MED ORDER — SODIUM CHLORIDE 0.9 % IV BOLUS (SEPSIS)
1000.0000 mL | Freq: Once | INTRAVENOUS | Status: AC
  Administered 2015-12-08: 1000 mL via INTRAVENOUS

## 2015-12-08 NOTE — Discharge Instructions (Signed)
Please schedule a follow-up appointment with a primary care physician for further management. Please take your home medicines for your reflux and gastritis. If symptoms return or worsen, please return to the nearest emergency room.

## 2015-12-08 NOTE — ED Provider Notes (Signed)
4:08 PM Care assumed from Dr. Regenia Skeeter.  At time of transfer of care, patient is awaiting complete ultrasound of the abdomen. Plan to rule out gallbladder, liver, or aortic pathology. If imaging studies are unremarkable, suspect patient has a gastritis and plan to discharge with Protonix and Pepcid.   Patient was pain-free at transfer of care according to report.  Ultrasound showed Colelithiasis but no cholecystitis.   Patient reports he Did not do well with protonix in past. He reports he is already on Rabeprazole. Patient will continue taking the medication.  Patient will follow up with a PCP and understood return precautions. Patient had no other questions or concerns and felt much better after work up. Patient discharged in good condition.  Clinical Impression: 1. Epigastric pain   2. Upper abdominal pain     Disposition: Discharge  Condition: Good  I have discussed the results, Dx and Tx plan with the pt(& family if present). He/she/they expressed understanding and agree(s) with the plan. Discharge instructions discussed at great length. Strict return precautions discussed and pt &/or family have verbalized understanding of the instructions. No further questions at time of discharge.    Discharge Medication List as of 12/08/2015  9:53 PM      Follow Up: [place] [address] [place] [phone] Schedule an appointment as soon as possible for a visit    [facility] [place] [phone] Schedule an appointment as soon as possible for a visit    [place] [id number] [place] [phone]  If symptoms worsen     Courtney Paris, MD 12/09/15 1714

## 2015-12-08 NOTE — ED Triage Notes (Addendum)
Pt reports onset this am of generalized abd pain with nausea. Denies vomiting, diarrhea or urinary symptoms. Pain is more severe upper abd and pt has cardiac hx.

## 2015-12-08 NOTE — ED Notes (Signed)
Patient transported to Ultrasound 

## 2015-12-08 NOTE — ED Notes (Signed)
Back from US.

## 2015-12-08 NOTE — ED Notes (Signed)
Delay for [place]; EDP aware

## 2015-12-08 NOTE — ED Provider Notes (Signed)
[facility] DEPT Provider Note   CSN: [id number] Arrival date & time: 12/08/15  1122     History   Chief Complaint Chief Complaint  Patient presents with  . Abdominal Pain    HPI [name] Benitez is a 60 y.o. male.  HPI  60 year old male with a history of CAD presents with upper abdominal pain since around 9:00 this morning. 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 Pain was sharp and severe, now is still painful but a little bit better and dull. Epigastric, does not radiate. No chest pain, shortness of breath, or back pain. No nausea or vomiting. Had 3 bowel movements this morning but was not diarrhea. Also was not hard. No fevers. No known history of gallbladder problems. No black stool or blood in stool.  Past Medical History:  Diagnosis Date  . CAD in native artery   . Coronary stent restenosis due to progression of disease April 2011   Subtotal occlusion of the LAD with ISR, unable to cross with wire; aneurysmal dilation of the LAD prior to stent  . Dyslipidemia, goal LDL below 70   . Erectile dysfunction   . History of GI bleed    No recurrence  . History of tobacco abuse    Quit prior to CABG  . History of vertigo   . Hypertension   . 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 ST elevation (STEMI) myocardial infarction involving left anterior descending coronary artery [facility]) October 2005   LAD PCI with 2 overlapping Cypher DES 3.0 mm 23 mm    Patient Active Problem List   Diagnosis Date Noted  . Nocturia 05/08/2013  . Atherosclerosis of native coronary artery without angina pectoris   . Essential hypertension   . Dyslipidemia, at goal LDL below 70   . Erectile dysfunction   . CAD  - S/P urgent CABG x 2 for subtotal occlusion of the LAD in a stented segment that was unable to be recanalized percutaneously 04/02/2009    Past Surgical History:  Procedure Laterality Date  .  CARDIAC CATHETERIZATION  04/28/2009   Subtotal occlusion of mid LAD with ISR/thrombosis, unable to pass --> urgent CABG (Dr. Pecola Lawless)  . CORONARY ANGIOPLASTY WITH STENT PLACEMENT  10/26/2003   anterior ischemia with significant reversibility (mid anterior and anteroseptal, apical anterior and apical anteroseptal)  - Cypher 3x31mm stent to mid LAD and Cypher 3x48mm Cypher DES to prox LAD(Dr. Gerrie Nordmann)  . CORONARY ARTERY BYPASS GRAFT  04/2009   LIMA-LAD, reverse SVG-distal RCA (Dr. Chauncey Cruel. Hendrickson)  . DOPPLER ECHOCARDIOGRAPHY  04/2009   EF 50-50%, borderline septal hypokinesis  . Exercise Tolerance Test  June 20/15   Exercise 8:05 Min, 10.1 METs, reached 99% max. Peak HR -- 160 bpm; NEGATIVE Bruce TM GXT = no EKG evidence of ischemia. Prolonged heart rate and blood pressure recovery and PVCs noted that resolve with Exerction.  Marland Kitchen NM MYOVIEW LTD  04/2009   For DOT physical -- Anterior ischemia with significant reversibility (mid anterior and anteroseptal, apical anterior and apical anteroseptal  . NM MYOVIEW LTD  06/2009   Post CABG: No ischemia, attenuation artifact and septal wall. No ischemia       Home Medications    Prior to Admission medications   Medication Sig Start Date End Date Taking? Authorizing Provider  aspirin EC 81 MG tablet Take 81 mg by mouth daily.  Yes Historical Provider, MD  benazepril (LOTENSIN) 20 MG tablet Take 1 tablet (20 mg total) by mouth daily. 11/23/14  Yes Leonie Man, MD  fish oil-omega-3 fatty acids 1000 MG capsule Take 1 g by mouth 2 (two) times daily.    Yes Historical Provider, MD  metoprolol succinate (TOPROL-XL) 50 MG 24 hr tablet TAKE 1 TABLET BY MOUTH EVERY DAY WITH OR IMMEDIATELY FOLLOWING A MEAL 11/23/14  Yes Leonie Man, MD  niacin (NIASPAN) 1000 MG CR tablet Take 1 tablet (1,000 mg total) by mouth at bedtime. 11/23/14  Yes Leonie Man, MD  RABEprazole (ACIPHEX) 20 MG tablet TAKE 1 TABLET BY MOUTH EVERY DAY 11/17/15  Yes Leonie Man,  MD  aspirin 325 MG tablet Take 325 mg by mouth daily.    Historical Provider, MD  sildenafil (VIAGRA) 100 MG tablet Take 1 tablet (100 mg total) by mouth daily as needed for erectile dysfunction. 11/23/14   Leonie Man, MD  simvastatin (ZOCOR) 40 MG tablet Take 1 tablet (40 mg total) by mouth daily at 6 PM. Patient not taking: Reported on 12/08/2015 11/23/14   Leonie Man, MD    Family History Family History  Problem Relation Age of Onset  . Hyperlipidemia Father   . Cancer Maternal Grandmother   . Cancer Paternal Grandfather     Social History Social History  Substance Use Topics  . Smoking status: Former Smoker    Quit date: 10/26/2003  . Smokeless tobacco: Never Used  . Alcohol use No     Allergies   Patient has no known allergies.   Review of Systems Review of Systems  Constitutional: Negative for fever.  Respiratory: Negative for shortness of breath.   Cardiovascular: Negative for chest pain.  Gastrointestinal: Positive for abdominal pain. Negative for blood in stool, constipation, diarrhea, nausea and vomiting.  Genitourinary: Negative for dysuria.  All other systems reviewed and are negative.    Physical Exam Updated Vital Signs BP 144/98 (BP Location: Right Arm)   Pulse 70   Temp 98.4 F (36.9 C) (Oral)   Resp 18   SpO2 99%   Physical Exam  Constitutional: He is oriented to person, place, and time. He appears well-developed and well-nourished.  HENT:  Head: Normocephalic and atraumatic.  Right Ear: External ear normal.  Left Ear: External ear normal.  Nose: Nose normal.  Eyes: Right eye exhibits no discharge. Left eye exhibits no discharge.  Neck: Neck supple.  Cardiovascular: Normal rate, regular rhythm and normal heart sounds.   Pulses:      Dorsalis pedis pulses are 2+ on the right side, and 2+ on the left side.  Pulmonary/Chest: Effort normal and breath sounds normal.  Abdominal: Soft. There is tenderness (mild) in the epigastric area.  There is negative Murphy's sign.  Musculoskeletal: He exhibits no edema.  Neurological: He is alert and oriented to person, place, and time.  Skin: Skin is warm and dry.  Nursing note and vitals reviewed.    ED Treatments / Results  Labs (all labs ordered are listed, but only abnormal results are displayed) Labs Reviewed  COMPREHENSIVE METABOLIC PANEL - Abnormal; Notable for the following:       Result Value   CO2 17 (*)    Glucose, Bld 128 (*)    Total Bilirubin 2.0 (*)    All other components within normal limits  CBC - Abnormal; Notable for the following:    RBC 6.24 (*)    Hemoglobin 18.9 (*)  HCT 52.8 (*)    All other components within normal limits  URINALYSIS, ROUTINE W REFLEX MICROSCOPIC - Abnormal; Notable for the following:    Ketones, ur 20 (*)    Protein, ur 30 (*)    Squamous Epithelial / LPF 0-5 (*)    All other components within normal limits  LIPASE, BLOOD  I-STAT TROPOININ, ED    EKG  EKG Interpretation  Date/Time:  Wednesday December 08 2015 11:58:53 EST Ventricular Rate:  65 PR Interval:  162 QRS Duration: 96 QT Interval:  416 QTC Calculation: 432 R Axis:   14 Text Interpretation:  Normal sinus rhythm Incomplete right bundle branch block Borderline ECG no significant change since 2012 Confirmed by Andee Chivers MD, [facility] [phone]) on 12/08/2015 2:08:35 PM       Radiology No results found.  Procedures Procedures (including critical care time)  Medications Ordered in ED Medications  sodium chloride 0.9 % bolus 1,000 mL (1,000 mLs Intravenous New Bag/Given 12/08/15 1445)  HYDROmorphone (DILAUDID) injection 1 mg (1 mg Intravenous Given 12/08/15 1445)     Initial Impression / Assessment and Plan / ED Course  I have reviewed the triage vital signs and the nursing notes.  Pertinent labs & imaging results that were available during my care of the patient were reviewed by me and considered in my medical decision making (see chart for  details).  Clinical Course as of Dec 07 1605  Wed Dec 08, 2015  1433 Will evaluate for cholecystitis with an ultrasound. His lab work is reassuring, probably some mild dehydration with his elevated hemoglobin and ketones in the urine. Fluids, Dilaudid for pain, and right upper quadrant ultrasound. I doubt this is atypical ACS.  [SG]  1606 Patient pain is a 0. Currently waiting on abdominal ultrasound. This patient for AAA as low but given his vascular history this will be incorporated in his ultrasound. Strong distal pulses. Rule out cholecystitis with ultrasound, if negative discharge home. Dr. Sherry Ruffing to follow up on ultrasound results  [SG]    Clinical Course User Index [SG] Sherwood Gambler, MD    Final Clinical Impressions(s) / ED Diagnoses   Final diagnoses:  Upper abdominal pain    New Prescriptions New Prescriptions   No medications on file     Sherwood Gambler, MD 12/08/15 1609

## 2015-12-14 ENCOUNTER — Encounter: Payer: Self-pay | Admitting: Physician Assistant

## 2015-12-14 ENCOUNTER — Ambulatory Visit (INDEPENDENT_AMBULATORY_CARE_PROVIDER_SITE_OTHER): Payer: BLUE CROSS/BLUE SHIELD | Admitting: Physician Assistant

## 2015-12-14 VITALS — BP 108/80 | HR 79 | Temp 98.1°F | Resp 14 | Ht 66.5 in | Wt 188.0 lb

## 2015-12-14 DIAGNOSIS — D582 Other hemoglobinopathies: Secondary | ICD-10-CM

## 2015-12-14 DIAGNOSIS — K2100 Gastro-esophageal reflux disease with esophagitis, without bleeding: Secondary | ICD-10-CM

## 2015-12-14 DIAGNOSIS — K8020 Calculus of gallbladder without cholecystitis without obstruction: Secondary | ICD-10-CM | POA: Diagnosis not present

## 2015-12-14 LAB — CBC
HCT: 50.2 % (ref 39.0–52.0)
Hemoglobin: 17 g/dL (ref 13.0–17.0)
MCHC: 34 g/dL (ref 30.0–36.0)
MCV: 85.7 fl (ref 78.0–100.0)
PLATELETS: 208 10*3/uL (ref 150.0–400.0)
RBC: 5.86 Mil/uL — AB (ref 4.22–5.81)
RDW: 13.8 % (ref 11.5–15.5)
WBC: 4.7 10*3/uL (ref 4.0–10.5)

## 2015-12-14 NOTE — Progress Notes (Signed)
Pre visit review using our clinic review tool, if applicable. No additional management support is needed unless otherwise documented below in the visit note. 

## 2015-12-14 NOTE — Progress Notes (Signed)
Patient presents to clinic today to establish care and for ER follow-up.   Patient with history of hypertension, hyperlipidemia and Coronary artery disease s/p MI in 2005 and CABG x 2 in 2011. Is currently followed by Cardiology. Patient is currently on a regimen of 81 mg ASA, Toprol XL, Benazepril, Zocor. Endorses taking medications as directed. Patient denies chest pain, palpitations, lightheadedness, dizziness, vision changes or frequent headaches.  Patient also presents for ER follow-up. Patient presented to ER on 12/08/15 with complaints of upper abdominal pain. ER workup included labs (unremrakable overall but with some increase in Hgb/Hct) and EKG revealing NSR with Incomplete RBBB. US abdomen obtained revealing cholelithiasis without obstruction or cholecystitis. Patient with significant history of GERD, currently on Aciphex. Is taking medication as directed. Denies any residual abdominal pain since ER assessment. Does note intermittent acid reflux but only when he forgets medication. Denies change to bowel/bladder habits. Denies nausea or vomiting.   Past Medical History:  Diagnosis Date  . CAD in native artery   . Coronary stent restenosis due to progression of disease April 2011   Subtotal occlusion of the LAD with ISR, unable to cross with wire; aneurysmal dilation of the LAD prior to stent  . Dyslipidemia, goal LDL below 70   . Erectile dysfunction   . History of GI bleed    No recurrence  . History of tobacco abuse    Quit prior to CABG  . History of vertigo   . Hypertension   . 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 ST elevation (STEMI) myocardial infarction involving left anterior descending coronary artery [facility]) October 2005   LAD PCI with 2 overlapping Cypher DES 3.0 mm 23 mm    Current Outpatient Prescriptions on File Prior to Visit  Medication Sig Dispense Refill  . aspirin EC 81 MG  tablet Take 81 mg by mouth daily.    . benazepril (LOTENSIN) 20 MG tablet Take 1 tablet (20 mg total) by mouth daily. 90 tablet 3  . fish oil-omega-3 fatty acids 1000 MG capsule Take 1 g by mouth 2 (two) times daily.     . metoprolol succinate (TOPROL-XL) 50 MG 24 hr tablet TAKE 1 TABLET BY MOUTH EVERY DAY WITH OR IMMEDIATELY FOLLOWING A MEAL 90 tablet 3  . niacin (NIASPAN) 1000 MG CR tablet Take 1 tablet (1,000 mg total) by mouth at bedtime. 90 tablet 3  . RABEprazole (ACIPHEX) 20 MG tablet TAKE 1 TABLET BY MOUTH EVERY DAY 90 tablet 3  . sildenafil (VIAGRA) 100 MG tablet Take 1 tablet (100 mg total) by mouth daily as needed for erectile dysfunction. 6 tablet 11  . simvastatin (ZOCOR) 40 MG tablet Take 1 tablet (40 mg total) by mouth daily at 6 PM. 90 tablet 3   No current facility-administered medications on file prior to visit.     No Known Allergies  Family History  Problem Relation Age of Onset  . Hyperlipidemia Father   . Cancer Maternal Grandmother   . Cancer Paternal Grandfather     Social History   Social History  . Marital status: Divorced    Spouse name: N/A  . Number of children: 1  . Years of education: 52   Occupational History  .  [employer]   Social History Main Topics  . Smoking status: Former Smoker    Quit date: 10/26/2003  . Smokeless  tobacco: Never Used  . Alcohol use Yes     Comment: occas  . Drug use: No  . Sexual activity: Yes    Birth control/ protection: Condom   Other Topics Concern  . None   Social History Narrative   Divorced father of one, grandfather 2. No longer smokes -- quit in 2005. Works for [employer] in [place]. Does not get routine activity/exercise -- Basically, he is not motivated to do any exercise. He, by himself, is admittedly lazy. Does not drink alcohol.   Review of Systems - See HPI.  All other ROS are negative.  BP 108/80   Pulse 79   Temp 98.1 F (36.7 C) (Oral)   Resp 14   Ht 5' 6.5"  (1.689 m)   Wt 188 lb (85.3 kg)   SpO2 98%   BMI 29.89 kg/m   Physical Exam  Constitutional: He is oriented to person, place, and time and well-developed, well-nourished, and in no distress.  HENT:  Head: Normocephalic and atraumatic.  Eyes: Conjunctivae are normal.  Neck: Neck supple.  Cardiovascular: Normal rate, regular rhythm, normal heart sounds and intact distal pulses.   Pulmonary/Chest: Effort normal and breath sounds normal. No respiratory distress. He has no wheezes. He has no rales. He exhibits no tenderness.  Neurological: He is alert and oriented to person, place, and time.  Skin: Skin is warm and dry. No rash noted.  Psychiatric: Affect normal.  Vitals reviewed.   Recent Results (from the past 2160 hour(s))  Lipase, blood     Status: None   Collection Time: 12/08/15 11:55 AM  Result Value Ref Range   Lipase 26 11 - 51 U/L  Comprehensive metabolic panel     Status: Abnormal   Collection Time: 12/08/15 11:55 AM  Result Value Ref Range   Sodium 138 135 - 145 mmol/L   Potassium 4.0 3.5 - 5.1 mmol/L   Chloride 109 101 - 111 mmol/L   CO2 17 (L) 22 - 32 mmol/L   Glucose, Bld 128 (H) 65 - 99 mg/dL   BUN 14 6 - 20 mg/dL   Creatinine, Ser 1.00 0.61 - 1.24 mg/dL   Calcium 10.1 8.9 - 10.3 mg/dL   Total Protein 7.5 6.5 - 8.1 g/dL   Albumin 4.3 3.5 - 5.0 g/dL   AST 32 15 - 41 U/L   ALT 30 17 - 63 U/L   Alkaline Phosphatase 65 38 - 126 U/L   Total Bilirubin 2.0 (H) 0.3 - 1.2 mg/dL   GFR calc non Af Amer >60 >60 mL/min   GFR calc Af Amer >60 >60 mL/min    Comment: (NOTE) The eGFR has been calculated using the CKD EPI equation. This calculation has not been validated in all clinical situations. eGFR's persistently <60 mL/min signify possible Chronic Kidney Disease.    Anion gap 12 5 - 15  CBC     Status: Abnormal   Collection Time: 12/08/15 11:55 AM  Result Value Ref Range   WBC 9.9 4.0 - 10.5 K/uL   RBC 6.24 (H) 4.22 - 5.81 MIL/uL   Hemoglobin 18.9 (H) 13.0 -  17.0 g/dL   HCT 52.8 (H) 39.0 - 52.0 %   MCV 84.6 78.0 - 100.0 fL   MCH 30.3 26.0 - 34.0 pg   MCHC 35.8 30.0 - 36.0 g/dL   RDW 13.3 11.5 - 15.5 %   Platelets 182 150 - 400 K/uL  I-Stat Troponin, ED (not at [facility])  Status: None   Collection Time: 12/08/15 12:17 PM  Result Value Ref Range   Troponin i, poc 0.00 0.00 - 0.08 ng/mL   Comment 3            Comment: Due to the release kinetics of cTnI, a negative result within the first hours of the onset of symptoms does not rule out myocardial infarction with certainty. If myocardial infarction is still suspected, repeat the test at appropriate intervals.   Urinalysis, Routine w reflex microscopic     Status: Abnormal   Collection Time: 12/08/15 12:55 PM  Result Value Ref Range   Color, Urine YELLOW YELLOW   APPearance CLEAR CLEAR   Specific Gravity, Urine 1.024 1.005 - 1.030   pH 8.0 5.0 - 8.0   Glucose, UA NEGATIVE NEGATIVE mg/dL   Hgb urine dipstick NEGATIVE NEGATIVE   Bilirubin Urine NEGATIVE NEGATIVE   Ketones, ur 20 (A) NEGATIVE mg/dL   Protein, ur 30 (A) NEGATIVE mg/dL   Nitrite NEGATIVE NEGATIVE   Leukocytes, UA NEGATIVE NEGATIVE   RBC / HPF 0-5 0 - 5 RBC/hpf   WBC, UA 0-5 0 - 5 WBC/hpf   Bacteria, UA NONE SEEN NONE SEEN   Squamous Epithelial / LPF 0-5 (A) NONE SEEN   Mucous PRESENT    Assessment/Plan: 1. Elevated hemoglobin ([place]) Noted on labs in ER. Will repeat today.  - CBC  2. Calculus of gallbladder without cholecystitis without obstruction Referral to General Surgery placed. No current symptoms but patient has been symptomatic recently. Diet discussed. ER precautions reviewed.   3. Gastroesophageal reflux disease with esophagitis Continue Aciphex. Discussed dietary recommendations. FU discussed.    Leeanne Rio, PA-C

## 2015-12-14 NOTE — Patient Instructions (Signed)
Please go to the lab for blood work. I will call you with your results.  Please continue your Aciphex as directed. Follow the dietary recommendations below. Also be sure to limit fatty foods.  You will be contacted by a specialist for further assessment of gallstones.   If you have any recurrence of severe symptoms, please go to the ER.   Food Choices for Gastroesophageal Reflux Disease, Adult When you have gastroesophageal reflux disease (GERD), the foods you eat and your eating habits are very important. Choosing the right foods can help ease your discomfort. What guidelines do I need to follow?  Choose fruits, vegetables, whole grains, and low-fat dairy products.  Choose low-fat meat, fish, and poultry.  Limit fats such as oils, salad dressings, butter, nuts, and avocado.  Keep a food diary. This helps you identify foods that cause symptoms.  Avoid foods that cause symptoms. These may be different for everyone.  Eat small meals often instead of 3 large meals a day.  Eat your meals slowly, in a place where you are relaxed.  Limit fried foods.  Cook foods using methods other than frying.  Avoid drinking alcohol.  Avoid drinking large amounts of liquids with your meals.  Avoid bending over or lying down until 2-3 hours after eating. What foods are not recommended? These are some foods and drinks that may make your symptoms worse: Vegetables  Tomatoes. Tomato juice. Tomato and spaghetti sauce. Chili peppers. Onion and garlic. Horseradish. Fruits  Oranges, grapefruit, and lemon (fruit and juice). Meats  High-fat meats, fish, and poultry. This includes hot dogs, ribs, ham, sausage, salami, and bacon. Dairy  Whole milk and chocolate milk. Sour cream. Cream. Butter. Ice cream. Cream cheese. Drinks  Coffee and tea. Bubbly (carbonated) drinks or energy drinks. Condiments  Hot sauce. Barbecue sauce. Sweets/Desserts  Chocolate and cocoa. Donuts. Peppermint and  spearmint. Fats and Oils  High-fat foods. This includes [place] fries and potato chips. Other  Vinegar. Strong spices. This includes black pepper, white pepper, red pepper, cayenne, curry powder, cloves, ginger, and chili powder. The items listed above may not be a complete list of foods and drinks to avoid. Contact your dietitian for more information.  This information is not intended to replace advice given to you by your health care provider. Make sure you discuss any questions you have with your health care provider. Document Released: 06/20/2011 Document Revised: 05/27/2015 Document Reviewed: 10/23/2012 Elsevier Interactive Patient Education  2017 [employer].

## 2015-12-15 LAB — LIPID PANEL
Cholesterol: 102 mg/dL (ref <200)
HDL: 39 mg/dL — ABNORMAL LOW (ref >40)
LDL CALC: 44 mg/dL (ref <100)
Total CHOL/HDL Ratio: 2.6 Ratio (ref <5.0)
Triglycerides: 97 mg/dL (ref <150)
VLDL: 19 mg/dL (ref <30)

## 2015-12-15 LAB — COMPREHENSIVE METABOLIC PANEL
ALT: 23 U/L (ref 9–46)
AST: 21 U/L (ref 10–35)
Albumin: 4.1 g/dL (ref 3.6–5.1)
Alkaline Phosphatase: 57 U/L (ref 40–115)
BILIRUBIN TOTAL: 1.2 mg/dL (ref 0.2–1.2)
BUN: 11 mg/dL (ref 7–25)
CO2: 23 mmol/L (ref 20–31)
CREATININE: 1.02 mg/dL (ref 0.70–1.25)
Calcium: 9 mg/dL (ref 8.6–10.3)
Chloride: 110 mmol/L (ref 98–110)
Glucose, Bld: 105 mg/dL — ABNORMAL HIGH (ref 65–99)
Potassium: 4.2 mmol/L (ref 3.5–5.3)
SODIUM: 141 mmol/L (ref 135–146)
TOTAL PROTEIN: 6.4 g/dL (ref 6.1–8.1)

## 2015-12-16 ENCOUNTER — Encounter: Payer: Self-pay | Admitting: Emergency Medicine

## 2015-12-21 ENCOUNTER — Encounter: Payer: Self-pay | Admitting: *Deleted

## 2015-12-21 ENCOUNTER — Telehealth: Payer: Self-pay | Admitting: *Deleted

## 2015-12-21 NOTE — Telephone Encounter (Signed)
-----   Message from Leonie Man, MD sent at 12/15/2015 10:58 PM EST ----- Overall, chemistry levels and liver function tests look very good. Kidney function and liver function looked good. Sodium-potassium look good. Blood glucose is still borderline elevated, but better then a week ago. Cholesterolosis great total cholesterol being 102, HDL 39, LDL 44 and triglycerides 97. These are all pretty much at goal.  For now we'll just continue current regimen unless he has issues with statin.  Glenetta Hew, MD

## 2015-12-21 NOTE — Telephone Encounter (Signed)
CALLED UNABLE TO LEAVE MESSAGE , VOICE MAIL  FULL. MAILED LETTER SENT WITH RESULT

## 2016-01-06 DIAGNOSIS — K8020 Calculus of gallbladder without cholecystitis without obstruction: Secondary | ICD-10-CM | POA: Diagnosis not present

## 2016-01-26 ENCOUNTER — Other Ambulatory Visit: Payer: Self-pay | Admitting: Cardiology

## 2016-01-26 NOTE — Telephone Encounter (Signed)
REFILL 

## 2016-02-17 ENCOUNTER — Other Ambulatory Visit: Payer: Self-pay | Admitting: Cardiology

## 2016-02-17 NOTE — Telephone Encounter (Signed)
Rx(s) sent to pharmacy electronically.  

## 2016-03-17 ENCOUNTER — Other Ambulatory Visit: Payer: Self-pay | Admitting: Cardiology

## 2016-03-31 ENCOUNTER — Other Ambulatory Visit: Payer: Self-pay | Admitting: Cardiology

## 2016-03-31 NOTE — Telephone Encounter (Signed)
Rx request sent to pharmacy.  

## 2016-10-12 ENCOUNTER — Ambulatory Visit (INDEPENDENT_AMBULATORY_CARE_PROVIDER_SITE_OTHER): Payer: BLUE CROSS/BLUE SHIELD | Admitting: Physician Assistant

## 2016-10-12 ENCOUNTER — Encounter: Payer: Self-pay | Admitting: Physician Assistant

## 2016-10-12 VITALS — BP 104/70 | HR 80 | Temp 98.3°F | Resp 14 | Ht 67.0 in | Wt 199.0 lb

## 2016-10-12 DIAGNOSIS — L600 Ingrowing nail: Secondary | ICD-10-CM | POA: Diagnosis not present

## 2016-10-12 DIAGNOSIS — L03032 Cellulitis of left toe: Secondary | ICD-10-CM

## 2016-10-12 DIAGNOSIS — M25775 Osteophyte, left foot: Secondary | ICD-10-CM | POA: Diagnosis not present

## 2016-10-12 NOTE — Progress Notes (Signed)
Pre visit review using our clinic review tool, if applicable. No additional management support is needed unless otherwise documented below in the visit note. 

## 2016-10-12 NOTE — Progress Notes (Signed)
Patient presents to clinic today c/o 1 week of redness, warmth and tenderness of lateral L great toe. Denies trauma or injury. Symptoms are worsening. Patient has attempted to keep skin clean and dry. Denies fever, chills or decreased ROM.  Past Medical History:  Diagnosis Date  . CAD in native artery   . Coronary stent restenosis due to progression of disease April 2011   Subtotal occlusion of the LAD with ISR, unable to cross with wire; aneurysmal dilation of the LAD prior to stent  . Dyslipidemia, goal LDL below 70   . Erectile dysfunction   . History of GI bleed    No recurrence  . History of tobacco abuse    Quit prior to CABG  . History of vertigo   . Hypertension   . 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 ST elevation (STEMI) myocardial infarction involving left anterior descending coronary artery [facility]) October 2005   LAD PCI with 2 overlapping Cypher DES 3.0 mm 23 mm    Current Outpatient Prescriptions on File Prior to Visit  Medication Sig Dispense Refill  . aspirin EC 81 MG tablet Take 81 mg by mouth daily.    . benazepril (LOTENSIN) 20 MG tablet TAKE 1 TABLET BY MOUTH EVERY DAY 90 tablet 2  . fish oil-omega-3 fatty acids 1000 MG capsule Take 1 g by mouth 2 (two) times daily.     . metoprolol succinate (TOPROL-XL) 50 MG 24 hr tablet TAKE 1 TABLET BY MOUTH EVERY DAY WITH OR IMMEDIATELY FOLLOWING A MEAL 90 tablet 2  . niacin (NIASPAN) 1000 MG CR tablet TAKE 1 TABLET BY MOUTH AT BEDTIME 90 tablet 3  . RABEprazole (ACIPHEX) 20 MG tablet TAKE 1 TABLET BY MOUTH EVERY DAY 90 tablet 3  . sildenafil (VIAGRA) 100 MG tablet Take 1 tablet (100 mg total) by mouth daily as needed for erectile dysfunction. 6 tablet 11  . simvastatin (ZOCOR) 40 MG tablet TAKE 1 TABLET BY MOUTH EVERY DAY AT 6PM 90 tablet 2   No current facility-administered medications on file prior to visit.     No Known  Allergies  Family History  Problem Relation Age of Onset  . Hyperlipidemia Father   . Cancer Maternal Grandmother   . Cancer Paternal Grandfather     Social History   Social History  . Marital status: Divorced    Spouse name: N/A  . Number of children: 1  . Years of education: 29   Occupational History  .  [employer]   Social History Main Topics  . Smoking status: Former Smoker    Quit date: 10/26/2003  . Smokeless tobacco: Never Used  . Alcohol use Yes     Comment: occas  . Drug use: No  . Sexual activity: Yes    Birth control/ protection: Condom   Other Topics Concern  . None   Social History Narrative   Divorced father of one, grandfather 2. No longer smokes -- quit in 2005. Works for [employer] in [place]. Does not get routine activity/exercise -- Basically, he is not motivated to do any exercise. He, by himself, is admittedly lazy. Does not drink alcohol.   Review of Systems - See HPI.  All other ROS are negative.  BP 104/70   Pulse 80   Temp 98.3 F (36.8 C) (Oral)   Resp 14   Ht 5'  7" (1.702 m)   Wt 199 lb (90.3 kg)   SpO2 98%   BMI 31.17 kg/m   Physical Exam  Constitutional: He is oriented to person, place, and time and well-developed, well-nourished, and in no distress.  HENT:  Head: Normocephalic and atraumatic.  Eyes: Conjunctivae are normal.  Neck: Neck supple.  Cardiovascular: Normal rate, regular rhythm, normal heart sounds and intact distal pulses.   Pulmonary/Chest: Effort normal and breath sounds normal. No respiratory distress. He has no wheezes. He has no rales. He exhibits no tenderness.  Neurological: He is alert and oriented to person, place, and time.  Skin: Skin is warm and dry.     Psychiatric: Affect normal.  Vitals reviewed.    Assessment/Plan: 1. Paronychia of great toe of left foot Severe. Want him assessed by podiatry for partial nail removal. Can have him seen today so will hold off on oral  antibiotics and defer to specialist.  - Ambulatory referral to Podiatry  2. Ingrown nail Needs removal giving severe paronychia. Appt scheduled with specialist for today. - Ambulatory referral to Podiatry   Leeanne Rio, PA-C

## 2016-11-03 ENCOUNTER — Other Ambulatory Visit: Payer: Self-pay | Admitting: Cardiology

## 2016-11-30 ENCOUNTER — Other Ambulatory Visit: Payer: Self-pay | Admitting: Cardiology

## 2016-12-04 ENCOUNTER — Other Ambulatory Visit: Payer: Self-pay

## 2016-12-04 MED ORDER — SILDENAFIL CITRATE 100 MG PO TABS: 100.0000 mg | ORAL_TABLET | Freq: Every day | ORAL | 11 refills | Status: DC | PRN

## 2016-12-04 NOTE — Telephone Encounter (Signed)
Spoke with Dr Allison Quarry nurse Ivin Booty and she ok'd rx to be refilled; rx called in to pharmacy for refill.

## 2016-12-06 ENCOUNTER — Other Ambulatory Visit: Payer: Self-pay | Admitting: Cardiology

## 2016-12-06 NOTE — Telephone Encounter (Signed)
Rx(s) sent to pharmacy electronically.  

## 2017-01-03 ENCOUNTER — Other Ambulatory Visit: Payer: Self-pay | Admitting: Cardiology

## 2017-01-03 NOTE — Telephone Encounter (Signed)
Rx has been sent to the pharmacy electronically. ° °

## 2017-01-04 ENCOUNTER — Other Ambulatory Visit: Payer: Self-pay

## 2017-01-15 ENCOUNTER — Other Ambulatory Visit: Payer: Self-pay | Admitting: Cardiology

## 2017-01-26 ENCOUNTER — Encounter: Payer: Self-pay | Admitting: Physician Assistant

## 2017-01-26 ENCOUNTER — Other Ambulatory Visit: Payer: Self-pay

## 2017-01-26 ENCOUNTER — Ambulatory Visit: Payer: BLUE CROSS/BLUE SHIELD | Admitting: Physician Assistant

## 2017-01-26 VITALS — BP 110/70 | HR 64 | Temp 98.2°F | Resp 14 | Ht 67.0 in | Wt 202.0 lb

## 2017-01-26 DIAGNOSIS — K219 Gastro-esophageal reflux disease without esophagitis: Secondary | ICD-10-CM

## 2017-01-26 NOTE — Progress Notes (Signed)
Patient presents to clinic today c/o epigastric pain and bloating noted this morning. States it is mostly resolved now. Patient with history of GERD, currently on Aciphex taking daily as directed. Had been eating very well -- healthy meals -- until yesterday. Had 4 sausage links with waffles, fries for lunch and several beers that evening with dinner. Also has 2 servings of citrus fruits yesterday evening.  Past Medical History:  Diagnosis Date  . CAD in native artery   . Coronary stent restenosis due to progression of disease April 2011   Subtotal occlusion of the LAD with ISR, unable to cross with wire; aneurysmal dilation of the LAD prior to stent  . Dyslipidemia, goal LDL below 70   . Erectile dysfunction   . History of GI bleed    No recurrence  . History of tobacco abuse    Quit prior to CABG  . History of vertigo   . Hypertension   . 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 ST elevation (STEMI) myocardial infarction involving left anterior descending coronary artery [facility]) October 2005   LAD PCI with 2 overlapping Cypher DES 3.0 mm 23 mm    Current Outpatient Medications on File Prior to Visit  Medication Sig Dispense Refill  . aspirin EC 81 MG tablet Take 81 mg by mouth daily.    . benazepril (LOTENSIN) 20 MG tablet TAKE 1 TABLET BY MOUTH EVERY DAY 90 tablet 2  . fish oil-omega-3 fatty acids 1000 MG capsule Take 1 g by mouth 2 (two) times daily.     . metoprolol succinate (TOPROL-XL) 50 MG 24 hr tablet TAKE 1 TABLET BY MOUTH EVERY DAY WITH OR IMMEDIATELY FOLLOWING A MEAL 90 tablet 2  . niacin (NIASPAN) 1000 MG CR tablet TAKE 1 TABLET BY MOUTH AT BEDTIME 90 tablet 3  . RABEprazole (ACIPHEX) 20 MG tablet Take 1 tablet (20 mg total) by mouth daily. Please schedule a follow up appointment 90 tablet 3  . sildenafil (VIAGRA) 100 MG tablet Take 1 tablet (100 mg total) by mouth daily as needed for  erectile dysfunction. 6 tablet 11  . simvastatin (ZOCOR) 40 MG tablet Take 1 tablet (40 mg total) by mouth daily. 30 tablet 0   No current facility-administered medications on file prior to visit.     No Known Allergies  Family History  Problem Relation Age of Onset  . Hyperlipidemia Father   . Cancer Maternal Grandmother   . Cancer Paternal Grandfather     Social History   Socioeconomic History  . Marital status: Divorced    Spouse name: None  . Number of children: 1  . Years of education: 67  . Highest education level: None  Social Needs  . Financial resource strain: None  . Food insecurity - worry: None  . Food insecurity - inability: None  . Transportation needs - medical: None  . Transportation needs - non-medical: None  Occupational History    Employer: [employer]  Tobacco Use  . Smoking status: Former Smoker    Last attempt to quit: 10/26/2003    Years since quitting: 13.2  . Smokeless tobacco: Never Used  Substance and Sexual Activity  . Alcohol use: Yes    Comment: occas  . Drug use: No  . Sexual activity: Yes    Birth control/protection: Condom  Other Topics Concern  . None  Social History  Narrative   Divorced father of one, grandfather 2. No longer smokes -- quit in 2005. Works for [employer] in [place]. Does not get routine activity/exercise -- Basically, he is not motivated to do any exercise. He, by himself, is admittedly lazy. Does not drink alcohol.   Review of Systems - See HPI.  All other ROS are negative.  BP 110/70   Pulse 64   Temp 98.2 F (36.8 C) (Oral)   Resp 14   Ht 5\' 7"  (1.702 m)   Wt 202 lb (91.6 kg)   SpO2 97%   BMI 31.64 kg/m   Physical Exam  Constitutional: He is oriented to person, place, and time and well-developed, well-nourished, and in no distress.  HENT:  Head: Normocephalic and atraumatic.  Eyes: Conjunctivae are normal.  Cardiovascular: Normal rate, regular rhythm, normal heart sounds  and intact distal pulses.  Pulmonary/Chest: Effort normal and breath sounds normal. No respiratory distress. He has no wheezes. He has no rales. He exhibits no tenderness.  Abdominal: Soft. Bowel sounds are normal. He exhibits no distension and no mass. There is tenderness in the epigastric area. There is no rebound and no guarding.  Neurological: He is alert and oriented to person, place, and time.  Skin: Skin is warm and dry. No rash noted.  Psychiatric: Affect normal.  Vitals reviewed.  Assessment/Plan: 1. Gastroesophageal reflux disease without esophagitis Will check LFTS today due to history of gallbladder issue. This seems completely related to GERD flare 2/2 really poor dietary choices yesterday. Continue Aciphex. Add on Zantac 150 mg each evening for a few days. GERD diet. Follow-up if any recurrence. - Hepatic function panel   Leeanne Rio, PA-C

## 2017-01-26 NOTE — Patient Instructions (Signed)
Please stay well-hydrated and get plenty of rest.  Symptoms are consistent with an exacerbation of your reflux do to all the things you had to eat yesterday. I am glad symptoms are resolving now.  Continue Aciphex. Follow the diet below. Can add-on a Zantac 150 mg tablet over-the-counter in the evening if you have a heavier meal that day to prevent reflux.    Food Choices for Gastroesophageal Reflux Disease, Adult When you have gastroesophageal reflux disease (GERD), the foods you eat and your eating habits are very important. Choosing the right foods can help ease your discomfort. What guidelines do I need to follow?  Choose fruits, vegetables, whole grains, and low-fat dairy products.  Choose low-fat meat, fish, and poultry.  Limit fats such as oils, salad dressings, butter, nuts, and avocado.  Keep a food diary. This helps you identify foods that cause symptoms.  Avoid foods that cause symptoms. These may be different for everyone.  Eat small meals often instead of 3 large meals a day.  Eat your meals slowly, in a place where you are relaxed.  Limit fried foods.  Cook foods using methods other than frying.  Avoid drinking alcohol.  Avoid drinking large amounts of liquids with your meals.  Avoid bending over or lying down until 2-3 hours after eating. What foods are not recommended? These are some foods and drinks that may make your symptoms worse: Vegetables Tomatoes. Tomato juice. Tomato and spaghetti sauce. Chili peppers. Onion and garlic. Horseradish. Fruits Oranges, grapefruit, and lemon (fruit and juice). Meats High-fat meats, fish, and poultry. This includes hot dogs, ribs, ham, sausage, salami, and bacon. Dairy Whole milk and chocolate milk. Sour cream. Cream. Butter. Ice cream. Cream cheese. Drinks Coffee and tea. Bubbly (carbonated) drinks or energy drinks. Condiments Hot sauce. Barbecue sauce. Sweets/Desserts Chocolate and cocoa. Donuts. Peppermint and  spearmint. Fats and Oils High-fat foods. This includes [place] fries and potato chips. Other Vinegar. Strong spices. This includes black pepper, white pepper, red pepper, cayenne, curry powder, cloves, ginger, and chili powder. The items listed above may not be a complete list of foods and drinks to avoid. Contact your dietitian for more information. This information is not intended to replace advice given to you by your health care provider. Make sure you discuss any questions you have with your health care provider. Document Released: 06/20/2011 Document Revised: 05/27/2015 Document Reviewed: 10/23/2012 Elsevier Interactive Patient Education  2017 [employer].

## 2017-01-29 ENCOUNTER — Ambulatory Visit: Payer: BLUE CROSS/BLUE SHIELD | Admitting: Cardiology

## 2017-01-29 ENCOUNTER — Encounter: Payer: Self-pay | Admitting: Cardiology

## 2017-01-29 VITALS — BP 108/70 | HR 73 | Ht 67.0 in | Wt 196.4 lb

## 2017-01-29 DIAGNOSIS — I25811 Atherosclerosis of native coronary artery of transplanted heart without angina pectoris: Secondary | ICD-10-CM | POA: Diagnosis not present

## 2017-01-29 DIAGNOSIS — I10 Essential (primary) hypertension: Secondary | ICD-10-CM

## 2017-01-29 DIAGNOSIS — Z951 Presence of aortocoronary bypass graft: Secondary | ICD-10-CM | POA: Diagnosis not present

## 2017-01-29 DIAGNOSIS — E785 Hyperlipidemia, unspecified: Secondary | ICD-10-CM

## 2017-01-29 MED ORDER — BENAZEPRIL HCL 20 MG PO TABS: 20.0000 mg | ORAL_TABLET | Freq: Every day | ORAL | 3 refills | Status: DC

## 2017-01-29 MED ORDER — SIMVASTATIN 40 MG PO TABS: 40.0000 mg | ORAL_TABLET | Freq: Every day | ORAL | 3 refills | Status: DC

## 2017-01-29 MED ORDER — NIACIN ER (ANTIHYPERLIPIDEMIC) 1000 MG PO TBCR: 1000.0000 mg | EXTENDED_RELEASE_TABLET | Freq: Every day | ORAL | 3 refills | Status: DC

## 2017-01-29 NOTE — Progress Notes (Signed)
PCP: Brunetta Jeans, PA-C  Clinic Note: Chief Complaint  Patient presents with  . Follow-up  . Coronary Artery Disease    HPI: CALLAHAN WILD is a 62 y.o. male with a PMH below who presents today for annual f/u for CAD.  CORBAN KISTLER was last seen on 11/23/2015. He was doing well at that time with no significant symptoms.  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 - Low risk with no ischemia.  Recent Hospitalizations: None  Studies Reviewed:   n/a  Interval History: Yamato returns today doing fairly well.  He is been having some issues with some right upper quadrant pain from GERD or from gallbladder disease, but has not had any cardiac symptoms to speak of.  He has been making a conscious effort to adjust his diet and try to get more active and exercise.  He is happy to see that he is lost about 5 pounds. He did just get renewed for his DOT physical until 2020.  He therefore does not need another stress test this year.  He is relatively stable from a cardiac standpoint.  Denies any chest tightness pressure with rest or exertion.  No resting or exertional dyspnea.  Remainder of cardiac review of symptoms: No PND, orthopnea or edema.  No palpitations, lightheadedness, dizziness, weakness or syncope/near syncope. No TIA/amaurosis fugax symptoms. No claudication.   ROS: A comprehensive was performed. -- His weight goes up and down. Review of Systems  Constitutional: Negative.  Negative for chills, fever and malaise/fatigue.  HENT: Negative for congestion.   Respiratory: Negative for cough, shortness of breath and wheezing.   Cardiovascular: Negative.   Gastrointestinal: Positive for abdominal pain (RUQ pain) and heartburn. Negative for blood in stool, constipation and melena.  Genitourinary: Negative for hematuria.  Musculoskeletal: Positive for joint pain (Routine arthritis pains back and hips).  Skin: Negative.   Neurological: Negative for  dizziness.  Endo/Heme/Allergies: Negative for environmental allergies. Does not bruise/bleed easily.  Psychiatric/Behavioral: Negative for depression and memory loss. The patient is not nervous/anxious and does not have insomnia.   All other systems reviewed and are negative.   Past Medical History:  Diagnosis Date  . CAD in native artery   . Coronary stent restenosis due to progression of disease April 2011   Subtotal occlusion of the LAD with ISR, unable to cross with wire; aneurysmal dilation of the LAD prior to stent  . Dyslipidemia, goal LDL below 70   . Erectile dysfunction   . History of GI bleed    No recurrence  . History of tobacco abuse    Quit prior to CABG  . History of vertigo   . Hypertension   . 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 ST elevation (STEMI) myocardial infarction involving left anterior descending coronary artery [facility]) October 2005   LAD PCI with 2 overlapping Cypher DES 3.0 mm 23 mm    Past Surgical History:  Procedure Laterality Date  . CARDIAC CATHETERIZATION  04/28/2009   Subtotal occlusion of mid LAD with ISR/thrombosis, unable to pass --> urgent CABG (Dr. Pecola Lawless)  . CORONARY ANGIOPLASTY WITH STENT PLACEMENT  10/26/2003   anterior ischemia with significant reversibility (mid anterior and anteroseptal, apical anterior and apical anteroseptal)  - Cypher 3x55mm stent to mid LAD and Cypher 3x41mm Cypher DES to prox LAD(Dr. Gerrie Nordmann)  .  CORONARY ARTERY BYPASS GRAFT  04/2009   LIMA-LAD, reverse SVG-distal RCA (Dr. Chauncey Cruel. Hendrickson)  . DOPPLER ECHOCARDIOGRAPHY  04/2009   EF 50-50%, borderline septal hypokinesis  . Exercise Tolerance Test  June 20/15   Exercise 8:05 Min, 10.1 METs, reached 99% max. Peak HR -- 160 bpm; NEGATIVE Bruce TM GXT = no EKG evidence of ischemia. Prolonged heart rate and blood pressure recovery and PVCs noted that resolve with Exerction.  Marland Kitchen NM  MYOVIEW LTD  04/2009; 06/2009   a. For DOT physical -- Anterior ischemia with significant reversibility (mid anterior and anteroseptal, apical anterior and apical anteroseptal;; b. Post CABG: No ischemia, attenuation artifact and septal wall. No ischemia  . NM MYOVIEW LTD  04/2015   EF 55%. Small sized mild severity defect in the apical anterior, septal and apical wall. Low risk. No ischemia. The defect suspicious is suspicious of occult thinning as there was no regional wall motion abnormality noted. LOW RISK    Current Meds  Medication Sig  . aspirin EC 81 MG tablet Take 81 mg by mouth daily.  . benazepril (LOTENSIN) 20 MG tablet Take 1 tablet (20 mg total) by mouth daily.  . fish oil-omega-3 fatty acids 1000 MG capsule Take 1 g by mouth 2 (two) times daily.   . metoprolol succinate (TOPROL-XL) 50 MG 24 hr tablet TAKE 1 TABLET BY MOUTH EVERY DAY WITH OR IMMEDIATELY FOLLOWING A MEAL  . niacin (NIASPAN) 1000 MG CR tablet Take 1 tablet (1,000 mg total) by mouth at bedtime.  . RABEprazole (ACIPHEX) 20 MG tablet Take 1 tablet (20 mg total) by mouth daily. Please schedule a follow up appointment  . sildenafil (VIAGRA) 100 MG tablet Take 1 tablet (100 mg total) by mouth daily as needed for erectile dysfunction.  . simvastatin (ZOCOR) 40 MG tablet Take 1 tablet (40 mg total) by mouth daily.  . [DISCONTINUED] benazepril (LOTENSIN) 20 MG tablet TAKE 1 TABLET BY MOUTH EVERY DAY  . [DISCONTINUED] niacin (NIASPAN) 1000 MG CR tablet TAKE 1 TABLET BY MOUTH AT BEDTIME  . [DISCONTINUED] simvastatin (ZOCOR) 40 MG tablet Take 1 tablet (40 mg total) by mouth daily.   No Known Allergies   Social History   Socioeconomic History  . Marital status: Divorced    Spouse name: None  . Number of children: 1  . Years of education: 76  . Highest education level: None  Social Needs  . Financial resource strain: None  . Food insecurity - worry: None  . Food insecurity - inability: None  . Transportation needs -  medical: None  . Transportation needs - non-medical: None  Occupational History    Employer: [employer]  Tobacco Use  . Smoking status: Former Smoker    Last attempt to quit: 10/26/2003    Years since quitting: 13.2  . Smokeless tobacco: Never Used  Substance and Sexual Activity  . Alcohol use: Yes    Comment: occas  . Drug use: No  . Sexual activity: Yes    Birth control/protection: Condom  Other Topics Concern  . None  Social History Narrative   Divorced father of one, grandfather 2. No longer smokes -- quit in 2005. Works for [employer] in [place]. Does not get routine activity/exercise -- Basically, he is not motivated to do any exercise. He, by himself, is admittedly lazy. Does not drink alcohol.    Family History  Problem Relation Age of Onset  . Hyperlipidemia Father   . Cancer Maternal Grandmother   .  Cancer Paternal Grandfather     Wt Readings from Last 3 Encounters:  01/29/17 196 lb 6.4 oz (89.1 kg)  01/26/17 202 lb (91.6 kg)  10/12/16 199 lb (90.3 kg)    PHYSICAL EXAM BP 108/70   Pulse 73   Ht 5\' 7"  (1.702 m)   Wt 196 lb 6.4 oz (89.1 kg)   BMI 30.76 kg/m   Physical Exam  Constitutional: He is oriented to person, place, and time. He appears well-developed. No distress.  Well-nourished, well-groomed.  Healthy-appearing.  Mildly obese  HENT:  Head: Normocephalic and atraumatic.  Eyes: EOM are normal.  Neck: No hepatojugular reflux and no JVD present. Carotid bruit is not present.  Cardiovascular: Normal rate, regular rhythm, intact distal pulses and normal pulses.  No extrasystoles are present. PMI is not displaced. Exam reveals no gallop and no friction rub.  No murmur heard. Pulmonary/Chest: Effort normal and breath sounds normal. No respiratory distress. He has no wheezes. He has no rales.  Abdominal: Soft. Bowel sounds are normal. He exhibits no distension. There is no tenderness. There is no rebound.  Musculoskeletal:  Normal range of motion. He exhibits no edema.  Neurological: He is alert and oriented to person, place, and time.  Skin: Skin is warm and dry.  Psychiatric: He has a normal mood and affect. His behavior is normal. Judgment and thought content normal.  Nursing note and vitals reviewed.   Adult ECG Report  Rate: 73 ;  Rhythm: normal sinus rhythm and IRBB to be otherwise normal axis, intervals and durations.;   Narrative Interpretation: stable EKG   Other studies Reviewed: Additional studies/ records that were reviewed today include:  Recent Labs:   Lab Results  Component Value Date   CHOL 102 12/14/2015   HDL 39 (L) 12/14/2015   LDLCALC 44 12/14/2015   TRIG 97 12/14/2015   CHOLHDL 2.6 12/14/2015   Due for check.    ASSESSMENT / PLAN: Problem List Items Addressed This Visit    Atherosclerosis of native coronary artery without angina pectoris - Primary (Chronic)    Stable without any active anginal symptoms.  Myoview was negative by most recent check as far as any ischemia.  He is on aspirin, beta-blocker, ACE inhibitor and statin.  No recurrent anginal symptoms.    Will need follow-up stress test done next year      Relevant Medications   benazepril (LOTENSIN) 20 MG tablet   simvastatin (ZOCOR) 40 MG tablet   niacin (NIASPAN) 1000 MG CR tablet   CAD  - S/P urgent CABG x 2 for subtotal occlusion of the LAD in a stented segment that was unable to be recanalized percutaneously (Chronic)    Last Myoview was in 2017.  From a CABG standpoint he would not be due for another checkup until 2021, however for his DOT physical, he probably needs another one next year. No significant anginal symptoms since his most recent cath in 2011 prior to CABG.      Relevant Orders   EKG 12-Lead (Completed)   Lipid panel   Comprehensive metabolic panel   Dyslipidemia, at goal LDL below 70 (Chronic)    Fairly well controlled when checked last December (2017).  Due for follow-up check now.  He is on  simvastatin 40 as well as niacin and fish oil.  No myalgias.      Relevant Medications   benazepril (LOTENSIN) 20 MG tablet   simvastatin (ZOCOR) 40 MG tablet   niacin (NIASPAN) 1000 MG CR tablet  Other Relevant Orders   EKG 12-Lead (Completed)   Lipid panel   Comprehensive metabolic panel   Essential hypertension (Chronic)    Well-controlled on current dose of Toprol and benazepril.      Relevant Medications   benazepril (LOTENSIN) 20 MG tablet   simvastatin (ZOCOR) 40 MG tablet   niacin (NIASPAN) 1000 MG CR tablet      Current medicines are reviewed at length with the patient today. (+/- concerns) none The following changes have been made: none  Patient Instructions  No changes medications    Your physician wants you to follow-up in 12 months with DR Everlie Eble. You will receive a reminder letter in the mail two months in advance. If you don't receive a letter, please call our office to schedule the follow-up appointment.     If you need a refill on your cardiac medications before your next appointment, please call your pharmacy.     Studies Ordered:   Orders Placed This Encounter  Procedures  . Lipid panel  . Comprehensive metabolic panel  . EKG 12-Lead      Glenetta Hew, M.D., M.S. Interventional Cardiologist   Pager # [phone] Phone # [phone] [address] [place], [place] [address]

## 2017-01-29 NOTE — Patient Instructions (Signed)
No changes medications    Your physician wants you to follow-up in 12 months with DR HARDING. You will receive a reminder letter in the mail two months in advance. If you don't receive a letter, please call our office to schedule the follow-up appointment.     If you need a refill on your cardiac medications before your next appointment, please call your pharmacy.

## 2017-01-30 ENCOUNTER — Encounter: Payer: Self-pay | Admitting: Cardiology

## 2017-01-30 NOTE — Assessment & Plan Note (Signed)
Fairly well controlled when checked last December (2017).  Due for follow-up check now.  He is on simvastatin 40 as well as niacin and fish oil.  No myalgias.

## 2017-01-30 NOTE — Assessment & Plan Note (Signed)
Well-controlled on current dose of Toprol and benazepril.

## 2017-01-30 NOTE — Assessment & Plan Note (Signed)
Stable without any active anginal symptoms.  Myoview was negative by most recent check as far as any ischemia.  He is on aspirin, beta-blocker, ACE inhibitor and statin.  No recurrent anginal symptoms.    Will need follow-up stress test done next year

## 2017-01-30 NOTE — Assessment & Plan Note (Signed)
Last Myoview was in 2017.  From a CABG standpoint he would not be due for another checkup until 2021, however for his DOT physical, he probably needs another one next year. No significant anginal symptoms since his most recent cath in 2011 prior to CABG.

## 2017-02-12 DIAGNOSIS — Z951 Presence of aortocoronary bypass graft: Secondary | ICD-10-CM | POA: Diagnosis not present

## 2017-02-12 DIAGNOSIS — E785 Hyperlipidemia, unspecified: Secondary | ICD-10-CM | POA: Diagnosis not present

## 2017-02-12 LAB — COMPREHENSIVE METABOLIC PANEL
ALK PHOS: 70 IU/L (ref 39–117)
ALT: 33 IU/L (ref 0–44)
AST: 27 IU/L (ref 0–40)
Albumin/Globulin Ratio: 1.8 (ref 1.2–2.2)
Albumin: 4.6 g/dL (ref 3.6–4.8)
BUN/Creatinine Ratio: 12 (ref 10–24)
BUN: 11 mg/dL (ref 8–27)
Bilirubin Total: 0.9 mg/dL (ref 0.0–1.2)
CHLORIDE: 104 mmol/L (ref 96–106)
CO2: 18 mmol/L — ABNORMAL LOW (ref 20–29)
Calcium: 9.5 mg/dL (ref 8.6–10.2)
Creatinine, Ser: 0.95 mg/dL (ref 0.76–1.27)
GFR calc non Af Amer: 85 mL/min/{1.73_m2} (ref >59)
GFR, EST AFRICAN AMERICAN: 99 mL/min/{1.73_m2} (ref >59)
GLUCOSE: 101 mg/dL — AB (ref 65–99)
Globulin, Total: 2.5 g/dL (ref 1.5–4.5)
Potassium: 4.7 mmol/L (ref 3.5–5.2)
Sodium: 138 mmol/L (ref 134–144)
TOTAL PROTEIN: 7.1 g/dL (ref 6.0–8.5)

## 2017-02-12 LAB — LIPID PANEL
CHOL/HDL RATIO: 3.2 ratio (ref 0.0–5.0)
Cholesterol, Total: 118 mg/dL (ref 100–199)
HDL: 37 mg/dL — ABNORMAL LOW (ref >39)
LDL CALC: 58 mg/dL (ref 0–99)
Triglycerides: 113 mg/dL (ref 0–149)
VLDL Cholesterol Cal: 23 mg/dL (ref 5–40)

## 2017-02-15 ENCOUNTER — Encounter: Payer: Self-pay | Admitting: Cardiology

## 2017-06-20 ENCOUNTER — Telehealth: Payer: Self-pay | Admitting: Cardiology

## 2017-06-20 NOTE — Telephone Encounter (Signed)
Pt calling   Stating he is going to [facility] and wanted to know should he expect a change in his BP and or does he need to adjust his BP medication. Please advise pt.

## 2017-06-20 NOTE — Telephone Encounter (Signed)
The patient called stating that he is going to [place] next week and will be going to [place]. He would like Dr. Allison Quarry opinion on any precautions he should take cardiac wise, if any. He was concerned about his blood pressure going up. Message has been routed.

## 2017-06-22 NOTE — Telephone Encounter (Signed)
Patient made aware of Dr. Allison Quarry recommendations and verbalized his understanding.

## 2017-06-22 NOTE — Telephone Encounter (Signed)
I do not think there are any issues with going to [place].  He is revascularized and should be fine.  May just need to take it easy and give himself some time to acclimatize.  Do not try to do anything exertional at 14,000 feet.  Glenetta Hew, MD

## 2017-06-22 NOTE — Telephone Encounter (Signed)
Left a message to call back.

## 2017-08-18 ENCOUNTER — Other Ambulatory Visit: Payer: Self-pay | Admitting: Cardiology

## 2017-10-21 ENCOUNTER — Other Ambulatory Visit: Payer: Self-pay | Admitting: Cardiology

## 2017-11-06 ENCOUNTER — Other Ambulatory Visit: Payer: Self-pay

## 2017-11-06 ENCOUNTER — Ambulatory Visit (INDEPENDENT_AMBULATORY_CARE_PROVIDER_SITE_OTHER): Payer: BLUE CROSS/BLUE SHIELD | Admitting: Physician Assistant

## 2017-11-06 ENCOUNTER — Encounter: Payer: Self-pay | Admitting: Physician Assistant

## 2017-11-06 VITALS — BP 112/78 | HR 81 | Temp 98.3°F | Resp 14 | Ht 67.0 in | Wt 204.0 lb

## 2017-11-06 DIAGNOSIS — Z125 Encounter for screening for malignant neoplasm of prostate: Secondary | ICD-10-CM

## 2017-11-06 DIAGNOSIS — R351 Nocturia: Secondary | ICD-10-CM | POA: Diagnosis not present

## 2017-11-06 DIAGNOSIS — I10 Essential (primary) hypertension: Secondary | ICD-10-CM | POA: Diagnosis not present

## 2017-11-06 DIAGNOSIS — E785 Hyperlipidemia, unspecified: Secondary | ICD-10-CM

## 2017-11-06 DIAGNOSIS — L219 Seborrheic dermatitis, unspecified: Secondary | ICD-10-CM

## 2017-11-06 DIAGNOSIS — Z0000 Encounter for general adult medical examination without abnormal findings: Secondary | ICD-10-CM

## 2017-11-06 DIAGNOSIS — Z1211 Encounter for screening for malignant neoplasm of colon: Secondary | ICD-10-CM

## 2017-11-06 NOTE — Patient Instructions (Addendum)
-Please go to the lab for blood work.  -Our office will call you with your results unless you have chosen to receive results via MyChart. -If your blood work is normal we will follow-up each year for physicals and as scheduled for chronic medical problems. -If anything is abnormal we will treat accordingly and get you in for a follow-up.   - You will be contacted by GI for your repeat screening colonoscopy.    - For the seborrhea, get a facial moisturizer for oily skin to use daily. Eat a clean diet (plenty of fresh fruits and veggies). You can use an anti-dandruff shampoo to the area once every couple of days, but do not apply around the eyes. Let us know if symptoms are not resolving.    Preventive Care 40-64 Years, Male Preventive care refers to lifestyle choices and visits with your health care provider that can promote health and wellness. What does preventive care include?  A yearly physical exam. This is also called an annual well check.  Dental exams once or twice a year.  Routine eye exams. Ask your health care provider how often you should have your eyes checked.  Personal lifestyle choices, including: ? Daily care of your teeth and gums. ? Regular physical activity. ? Eating a healthy diet. ? Avoiding tobacco and drug use. ? Limiting alcohol use. ? Practicing safe sex. ? Taking low-dose aspirin every day starting at age 72. What happens during an annual well check? The services and screenings done by your health care provider during your annual well check will depend on your age, overall health, lifestyle risk factors, and family history of disease. Counseling Your health care provider may ask you questions about your:  Alcohol use.  Tobacco use.  Drug use.  Emotional well-being.  Home and relationship well-being.  Sexual activity.  Eating habits.  Work and work Statistician.  Screening You may have the following tests or measurements:  Height, weight,  and BMI.  Blood pressure.  Lipid and cholesterol levels. These may be checked every 5 years, or more frequently if you are over 59 years old.  Skin check.  Lung cancer screening. You may have this screening every year starting at age 47 if you have a 30-pack-year history of smoking and currently smoke or have quit within the past 15 years.  Fecal occult blood test (FOBT) of the stool. You may have this test every year starting at age 70.  Flexible sigmoidoscopy or colonoscopy. You may have a sigmoidoscopy every 5 years or a colonoscopy every 10 years starting at age 82.  Prostate cancer screening. Recommendations will vary depending on your family history and other risks.  Hepatitis C blood test.  Hepatitis B blood test.  Sexually transmitted disease (STD) testing.  Diabetes screening. This is done by checking your blood sugar (glucose) after you have not eaten for a while (fasting). You may have this done every 1-3 years.  Discuss your test results, treatment options, and if necessary, the need for more tests with your health care provider. Vaccines Your health care provider may recommend certain vaccines, such as:  Influenza vaccine. This is recommended every year.  Tetanus, diphtheria, and acellular pertussis (Tdap, Td) vaccine. You may need a Td booster every 10 years.  Varicella vaccine. You may need this if you have not been vaccinated.  Zoster vaccine. You may need this after age 59.  Measles, mumps, and rubella (MMR) vaccine. You may need at least one dose of MMR  if you were born in 1957 or later. You may also need a second dose.  Pneumococcal 13-valent conjugate (PCV13) vaccine. You may need this if you have certain conditions and have not been vaccinated.  Pneumococcal polysaccharide (PPSV23) vaccine. You may need one or two doses if you smoke cigarettes or if you have certain conditions.  Meningococcal vaccine. You may need this if you have certain  conditions.  Hepatitis A vaccine. You may need this if you have certain conditions or if you travel or work in places where you may be exposed to hepatitis A.  Hepatitis B vaccine. You may need this if you have certain conditions or if you travel or work in places where you may be exposed to hepatitis B.  Haemophilus influenzae type b (Hib) vaccine. You may need this if you have certain risk factors.  Talk to your health care provider about which screenings and vaccines you need and how often you need them. This information is not intended to replace advice given to you by your health care provider. Make sure you discuss any questions you have with your health care provider. Document Released: 01/15/2015 Document Revised: 09/08/2015 Document Reviewed: 10/20/2014 Elsevier Interactive Patient Education  [employer].

## 2017-11-06 NOTE — Progress Notes (Signed)
Patient presents to clinic today for annual exam.  Patient is fasting for labs. Patient is followed by Cardiology for CAD, HTN, HLD. Is taking all medications as directed. Patient denies chest pain, palpitations, lightheadedness, dizziness, vision changes or frequent headaches. Notes he is scheduled for a repeat stress test in the next few months with Dr. Ellyn Hack.  Patient notes some flaky, oily areas around nose and eyes over the past several weeks. Are slightly itchy on rare occasion. Has been trying to avoid picking at the areas.   Health Maintenance: Immunizations -- Up-to-date. Colonoscopy -- Overdue. Last in 2005. Asymptomatic. Agrees to repeat colonoscopy.   Past Medical History:  Diagnosis Date  . CAD in native artery   . Coronary stent restenosis due to progression of disease April 2011   Subtotal occlusion of the LAD with ISR, unable to cross with wire; aneurysmal dilation of the LAD prior to stent  . Dyslipidemia, goal LDL below 70   . Erectile dysfunction   . History of GI bleed    No recurrence  . History of tobacco abuse    Quit prior to CABG  . History of vertigo   . Hypertension   . 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 ST elevation (STEMI) myocardial infarction involving left anterior descending coronary artery [facility]) October 2005   LAD PCI with 2 overlapping Cypher DES 3.0 mm 23 mm    Past Surgical History:  Procedure Laterality Date  . CARDIAC CATHETERIZATION  04/28/2009   Subtotal occlusion of mid LAD with ISR/thrombosis, unable to pass --> urgent CABG (Dr. Pecola Lawless)  . CORONARY ANGIOPLASTY WITH STENT PLACEMENT  10/26/2003   anterior ischemia with significant reversibility (mid anterior and anteroseptal, apical anterior and apical anteroseptal)  - Cypher 3x51mm stent to mid LAD and Cypher 3x30mm Cypher DES to prox LAD(Dr. Gerrie Nordmann)  . CORONARY ARTERY BYPASS GRAFT  04/2009     LIMA-LAD, reverse SVG-distal RCA (Dr. Chauncey Cruel. Hendrickson)  . DOPPLER ECHOCARDIOGRAPHY  04/2009   EF 50-50%, borderline septal hypokinesis  . Exercise Tolerance Test  June 20/15   Exercise 8:05 Min, 10.1 METs, reached 99% max. Peak HR -- 160 bpm; NEGATIVE Bruce TM GXT = no EKG evidence of ischemia. Prolonged heart rate and blood pressure recovery and PVCs noted that resolve with Exerction.  Marland Kitchen NM MYOVIEW LTD  04/2009; 06/2009   a. For DOT physical -- Anterior ischemia with significant reversibility (mid anterior and anteroseptal, apical anterior and apical anteroseptal;; b. Post CABG: No ischemia, attenuation artifact and septal wall. No ischemia  . NM MYOVIEW LTD  04/2015   EF 55%. Small sized mild severity defect in the apical anterior, septal and apical wall. Low risk. No ischemia. The defect suspicious is suspicious of occult thinning as there was no regional wall motion abnormality noted. LOW RISK    Current Outpatient Medications on File Prior to Visit  Medication Sig Dispense Refill  . aspirin EC 81 MG tablet Take 81 mg by mouth daily.    . benazepril (LOTENSIN) 20 MG tablet Take 1 tablet (20 mg total) by mouth daily. 90 tablet 3  . fish oil-omega-3 fatty acids 1000 MG capsule Take 1 g by mouth 2 (two) times daily.     . metoprolol succinate (TOPROL-XL) 50 MG 24 hr tablet TAKE 1 TABLET BY MOUTH EVERY DAY WITH OR IMMEDIATELY FOLLOWING A MEAL 90 tablet 1  .  niacin (NIASPAN) 1000 MG CR tablet Take 1 tablet (1,000 mg total) by mouth at bedtime. 90 tablet 3  . RABEprazole (ACIPHEX) 20 MG tablet Take 1 tablet (20 mg total) by mouth daily. 90 tablet 0  . sildenafil (VIAGRA) 100 MG tablet Take 1 tablet (100 mg total) by mouth daily as needed for erectile dysfunction. 6 tablet 11  . simvastatin (ZOCOR) 40 MG tablet Take 1 tablet (40 mg total) by mouth daily. 90 tablet 3   No current facility-administered medications on file prior to visit.     No Known Allergies  Family History  Problem Relation  Age of Onset  . Hyperlipidemia Father   . Cancer Maternal Grandmother   . Cancer Paternal Grandfather     Social History   Socioeconomic History  . Marital status: Divorced    Spouse name: Not on file  . Number of children: 1  . Years of education: 34  . Highest education level: Not on file  Occupational History    Employer: [employer]  . Financial resource strain: Not on file  . Food insecurity:    Worry: Not on file    Inability: Not on file  . Transportation needs:    Medical: Not on file    Non-medical: Not on file  Tobacco Use  . Smoking status: Former Smoker    Types: Cigarettes    Last attempt to quit: 10/26/2003    Years since quitting: 14.0  . Smokeless tobacco: Never Used  Substance and Sexual Activity  . Alcohol use: Yes    Comment: occas  . Drug use: No  . Sexual activity: Yes    Birth control/protection: Condom  Lifestyle  . Physical activity:    Days per week: Not on file    Minutes per session: Not on file  . Stress: Not on file  Relationships  . Social connections:    Talks on phone: Not on file    Gets together: Not on file    Attends religious service: Not on file    Active member of club or organization: Not on file    Attends meetings of clubs or organizations: Not on file    Relationship status: Not on file  . Intimate partner violence:    Fear of current or ex partner: Not on file    Emotionally abused: Not on file    Physically abused: Not on file    Forced sexual activity: Not on file  Other Topics Concern  . Not on file  Social History Narrative   Divorced father of one, grandfather 2. No longer smokes -- quit in 2005. Works for [employer] in [place]. Does not get routine activity/exercise -- Basically, he is not motivated to do any exercise. He, by himself, is admittedly lazy. Does not drink alcohol.   Review of Systems  Constitutional: Negative for fever and weight loss.  HENT: Negative  for ear discharge, ear pain, hearing loss and tinnitus.   Eyes: Negative for blurred vision, double vision, photophobia and pain.  Respiratory: Negative for cough and shortness of breath.   Cardiovascular: Negative for chest pain and palpitations.  Gastrointestinal: Negative for abdominal pain, blood in stool, constipation, diarrhea, heartburn, melena, nausea and vomiting.  Genitourinary: Negative for dysuria, flank pain, frequency, hematuria and urgency.       Nocturia x 3. Some occasional urinary hesitancy. Rare dribbling -- only when holding bladder for a long time  Musculoskeletal: Negative for falls.  Skin:       Dry skin around the medial region of his eyes x several months. Sometimes mildly itchy. Sometimes occurs around nose.    Neurological: Negative for dizziness, loss of consciousness and headaches.  Endo/Heme/Allergies: Negative for environmental allergies.  Psychiatric/Behavioral: Negative for depression, hallucinations, substance abuse and suicidal ideas. The patient is not nervous/anxious and does not have insomnia.    BP 112/78   Pulse 81   Temp 98.3 F (36.8 C) (Oral)   Resp 14   Ht 5\' 7"  (1.702 m)   Wt 204 lb (92.5 kg)   SpO2 98%   BMI 31.95 kg/m   Physical Exam  Constitutional: He is oriented to person, place, and time. He appears well-developed and well-nourished. No distress.  HENT:  Head: Normocephalic and atraumatic.  Right Ear: Tympanic membrane, external ear and ear canal normal.  Left Ear: Tympanic membrane, external ear and ear canal normal.  Nose: Nose normal.  Mouth/Throat: Oropharynx is clear and moist and mucous membranes are normal. No posterior oropharyngeal edema or posterior oropharyngeal erythema.  Seborrhea notes of nasolabial folds and creases. Also around medial right eye lid.   Eyes: Pupils are equal, round, and reactive to light. Conjunctivae are normal.  Neck: Neck supple. No thyromegaly present.  Cardiovascular: Normal rate, regular  rhythm, normal heart sounds and intact distal pulses.  Pulmonary/Chest: Effort normal and breath sounds normal. No respiratory distress. He has no wheezes. He has no rales. He exhibits no tenderness.  Abdominal: Soft. Bowel sounds are normal. He exhibits no distension and no mass. There is no tenderness. There is no rebound and no guarding.  Lymphadenopathy:    He has no cervical adenopathy.  Neurological: He is alert and oriented to person, place, and time. No cranial nerve deficit.  Skin: Skin is warm and dry. No rash noted. He is not diaphoretic.  Psychiatric: He has a normal mood and affect.  Vitals reviewed.   Assessment/Plan: 1. Visit for preventive health examination Depression screen negative. Health Maintenance reviewed. Preventive schedule discussed and handout given in AVS. Will obtain fasting labs today.  - CBC with Differential/Platelet - Comprehensive metabolic panel  2. Essential hypertension BP normotensive. Asymptomatic. Continue current regimen. Repeat labs today. - Comprehensive metabolic panel - Lipid panel - Hemoglobin A1c  3. Dyslipidemia, at goal LDL below 70 Is taking medications as directed. Followed by Cardiology. Repeat labs today. - Lipid panel - Hemoglobin A1c  4. Prostate cancer screening The natural history of prostate cancer and ongoing controversy regarding screening and potential treatment outcomes of prostate cancer has been discussed with the patient. The meaning of a false positive PSA and a false negative PSA has been discussed. He indicates understanding of the limitations of this screening test and wishes to proceed with screening PSA testing.  - PSA  5. Colon cancer screening Referral to GI placed for repeat screening colonoscopy - Ambulatory referral to Gastroenterology  6. Nocturia Concern for BPH. Declines exam today. Will check PSA. If normal will consider trial of Flomax. Urology referral if there are abnormal results.   7.  Seborrheic dermatitis Supportive measures and OTC lotions reviewed with patient. Clean diet recommended. Follow-up if not improving.    Leeanne Rio, PA-C

## 2017-11-07 LAB — LIPID PANEL
CHOL/HDL RATIO: 3
CHOLESTEROL: 115 mg/dL (ref 0–200)
HDL: 33.4 mg/dL — AB (ref >39.00)

## 2017-11-07 LAB — CBC WITH DIFFERENTIAL/PLATELET
Basophils Absolute: 0 10*3/uL (ref 0.0–0.1)
Basophils Relative: 0.5 % (ref 0.0–3.0)
Eosinophils Absolute: 0.1 10*3/uL (ref 0.0–0.7)
Eosinophils Relative: 1.5 % (ref 0.0–5.0)
HCT: 49.6 % (ref 39.0–52.0)
Hemoglobin: 17.1 g/dL — ABNORMAL HIGH (ref 13.0–17.0)
Lymphocytes Relative: 34 % (ref 12.0–46.0)
Lymphs Abs: 1.8 10*3/uL (ref 0.7–4.0)
MCHC: 34.5 g/dL (ref 30.0–36.0)
MCV: 87.1 fl (ref 78.0–100.0)
Monocytes Absolute: 0.8 10*3/uL (ref 0.1–1.0)
Monocytes Relative: 14.6 % — ABNORMAL HIGH (ref 3.0–12.0)
Neutro Abs: 2.7 10*3/uL (ref 1.4–7.7)
Neutrophils Relative %: 49.4 % (ref 43.0–77.0)
Platelets: 189 10*3/uL (ref 150.0–400.0)
RBC: 5.69 Mil/uL (ref 4.22–5.81)
RDW: 14.3 % (ref 11.5–15.5)
WBC: 5.4 10*3/uL (ref 4.0–10.5)

## 2017-11-07 LAB — COMPREHENSIVE METABOLIC PANEL
ALK PHOS: 56 U/L (ref 39–117)
ALT: 27 U/L (ref 0–53)
AST: 20 U/L (ref 0–37)
Albumin: 4.2 g/dL (ref 3.5–5.2)
BUN: 14 mg/dL (ref 6–23)
CHLORIDE: 107 meq/L (ref 96–112)
CO2: 21 mEq/L (ref 19–32)
Calcium: 9.1 mg/dL (ref 8.4–10.5)
Creatinine, Ser: 0.92 mg/dL (ref 0.40–1.50)
GFR: 88.36 mL/min (ref >60.00)
GLUCOSE: 140 mg/dL — AB (ref 70–99)
POTASSIUM: 3.8 meq/L (ref 3.5–5.1)
SODIUM: 139 meq/L (ref 135–145)
Total Bilirubin: 1 mg/dL (ref 0.2–1.2)
Total Protein: 6.4 g/dL (ref 6.0–8.3)

## 2017-11-07 LAB — LDL CHOLESTEROL, DIRECT: Direct LDL: 60 mg/dL

## 2017-11-07 LAB — PSA: PSA: 1.14 ng/mL (ref 0.10–4.00)

## 2017-11-07 LAB — HEMOGLOBIN A1C: Hgb A1c MFr Bld: 6.1 % (ref 4.6–6.5)

## 2017-11-08 ENCOUNTER — Other Ambulatory Visit: Payer: Self-pay | Admitting: Physician Assistant

## 2017-11-09 ENCOUNTER — Other Ambulatory Visit: Payer: Self-pay | Admitting: Physician Assistant

## 2017-11-09 DIAGNOSIS — E781 Pure hyperglyceridemia: Secondary | ICD-10-CM

## 2017-11-09 MED ORDER — FENOFIBRATE 145 MG PO TABS: 145.0000 mg | ORAL_TABLET | Freq: Every day | ORAL | 3 refills | Status: DC

## 2017-12-07 ENCOUNTER — Encounter: Payer: Self-pay | Admitting: Physician Assistant

## 2017-12-14 ENCOUNTER — Other Ambulatory Visit: Payer: Self-pay | Admitting: Cardiology

## 2017-12-19 ENCOUNTER — Other Ambulatory Visit (INDEPENDENT_AMBULATORY_CARE_PROVIDER_SITE_OTHER): Payer: BLUE CROSS/BLUE SHIELD

## 2017-12-19 DIAGNOSIS — E781 Pure hyperglyceridemia: Secondary | ICD-10-CM | POA: Diagnosis not present

## 2017-12-19 LAB — LIPID PANEL
Cholesterol: 97 mg/dL (ref 0–200)
HDL: 41 mg/dL (ref >39.00)
LDL Cholesterol: 42 mg/dL (ref 0–99)
NonHDL: 55.63
Total CHOL/HDL Ratio: 2
Triglycerides: 68 mg/dL (ref 0.0–149.0)
VLDL: 13.6 mg/dL (ref 0.0–40.0)

## 2018-01-11 ENCOUNTER — Ambulatory Visit: Payer: BLUE CROSS/BLUE SHIELD | Admitting: Cardiology

## 2018-01-11 ENCOUNTER — Encounter: Payer: Self-pay | Admitting: Cardiology

## 2018-01-11 VITALS — BP 102/76 | HR 76 | Ht 67.0 in | Wt 195.6 lb

## 2018-01-11 DIAGNOSIS — E785 Hyperlipidemia, unspecified: Secondary | ICD-10-CM | POA: Diagnosis not present

## 2018-01-11 DIAGNOSIS — I2510 Atherosclerotic heart disease of native coronary artery without angina pectoris: Secondary | ICD-10-CM

## 2018-01-11 DIAGNOSIS — Z951 Presence of aortocoronary bypass graft: Secondary | ICD-10-CM

## 2018-01-11 DIAGNOSIS — I10 Essential (primary) hypertension: Secondary | ICD-10-CM

## 2018-01-11 DIAGNOSIS — Z024 Encounter for examination for driving license: Secondary | ICD-10-CM | POA: Insufficient documentation

## 2018-01-11 NOTE — Patient Instructions (Signed)
Medication Instructions:  NO CHANGES If you need a refill on your cardiac medications before your next appointment, please call your pharmacy.   Lab work: NOT NEEDED If you have labs (blood work) drawn today and your tests are completely normal, you will receive your results only by: Marland Kitchen MyChart Message (if you have MyChart) OR . A paper copy in the mail If you have any lab test that is abnormal or we need to change your treatment, we will call you to review the results.  Testing/Procedures:  SCHEDULE AT [place] Your physician has requested that you have an exercise tolerance test. For further information please visit [email or web address]. Please also follow instruction sheet, as given.    Follow-Up: At [facility], you and your health needs are our priority.  As part of our continuing mission to provide you with exceptional heart care, we have created designated Provider Care Teams.  These Care Teams include your primary Cardiologist (physician) and Advanced Practice Providers (APPs -  Physician Assistants and Nurse Practitioners) who all work together to provide you with the care you need, when you need it. You will need a follow up appointment in 12 months JAN 2021.  Please call our office 2 months in advance to schedule this appointment.  You may see Glenetta Hew, MD or one of the following Advanced Practice Providers on your designated Care Team:   Rosaria Ferries, PA-C . Jory Sims, DNP, ANP  Any Other Special Instructions Will Be Listed Below (If Applicable).

## 2018-01-11 NOTE — Progress Notes (Signed)
PCP: Brunetta Jeans, PA-C  Clinic Note: No chief complaint on file.   HPI: [name] Benitez is a 63 y.o. male with a PMH below who presents today for annual f/u for CAD.  [name] Benitez was last seen on 11/23/2015. He was doing well at that time with no significant symptoms.  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 - Low risk with no ischemia.  Recent Hospitalizations: None  Studies Reviewed:   n/a  Interval History: [name] Benitez returns today doing fairly well.  He is been having some issues with some right upper quadrant pain from GERD or from gallbladder disease, but has not had any cardiac symptoms to speak of.  He has been making a conscious effort to adjust his diet and try to get more active and exercise.  He is happy to see that he is lost about 5 pounds. He did just get renewed for his DOT physical until 2020.  He therefore does not need another stress test this year.  He is relatively stable from a cardiac standpoint.  Denies any chest tightness pressure with rest or exertion.  No resting or exertional dyspnea.  Remainder of cardiac review of symptoms: No PND, orthopnea or edema.  No palpitations, lightheadedness, dizziness, weakness or syncope/near syncope. No TIA/amaurosis fugax symptoms. No claudication.   ROS: A comprehensive was performed. -- His weight goes up and down. Review of Systems  Constitutional: Negative.  Negative for chills, fever and malaise/fatigue.  HENT: Negative for congestion.   Respiratory: Negative for cough, shortness of breath and wheezing.   Cardiovascular: Negative.   Gastrointestinal: Positive for abdominal pain (RUQ pain) and heartburn. Negative for blood in stool, constipation and melena.  Genitourinary: Negative for hematuria.  Musculoskeletal: Positive for joint pain (Routine arthritis pains back and hips).  Skin: Negative.   Neurological: Negative for dizziness.  Endo/Heme/Allergies: Negative for  environmental allergies. Does not bruise/bleed easily.  Psychiatric/Behavioral: Negative for depression and memory loss. The patient is not nervous/anxious and does not have insomnia.   All other systems reviewed and are negative.   Past Medical History:  Diagnosis Date  . CAD in native artery   . Coronary stent restenosis due to progression of disease April 2011   Subtotal occlusion of the LAD with ISR, unable to cross with wire; aneurysmal dilation of the LAD prior to stent  . Dyslipidemia, goal LDL below 70   . Erectile dysfunction   . History of GI bleed    No recurrence  . History of tobacco abuse    Quit prior to CABG  . History of vertigo   . Hypertension   . 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 ST elevation (STEMI) myocardial infarction involving left anterior descending coronary artery [facility]) October 2005   LAD PCI with 2 overlapping Cypher DES 3.0 mm 23 mm    Past Surgical History:  Procedure Laterality Date  . CARDIAC CATHETERIZATION  04/28/2009   Subtotal occlusion of mid LAD with ISR/thrombosis, unable to pass --> urgent CABG (Dr. Pecola Lawless)  . CORONARY ANGIOPLASTY WITH STENT PLACEMENT  10/26/2003   anterior ischemia with significant reversibility (mid anterior and anteroseptal, apical anterior and apical anteroseptal)  - Cypher 3x26mm stent to mid LAD and Cypher 3x49mm Cypher DES to prox LAD(Dr. Gerrie Nordmann)  . CORONARY ARTERY BYPASS GRAFT  04/2009   LIMA-LAD, reverse  SVG-distal RCA (Dr. Chauncey Cruel. Hendrickson)  . DOPPLER ECHOCARDIOGRAPHY  04/2009   EF 50-50%, borderline septal hypokinesis  . Exercise Tolerance Test  June 20/15   Exercise 8:05 Min, 10.1 METs, reached 99% max. Peak HR -- 160 bpm; NEGATIVE Bruce TM GXT = no EKG evidence of ischemia. Prolonged heart rate and blood pressure recovery and PVCs noted that resolve with Exerction.  Marland Kitchen NM MYOVIEW LTD  04/2009; 06/2009   a. For DOT physical  -- Anterior ischemia with significant reversibility (mid anterior and anteroseptal, apical anterior and apical anteroseptal;; b. Post CABG: No ischemia, attenuation artifact and septal wall. No ischemia  . NM MYOVIEW LTD  04/2015   EF 55%. Small sized mild severity defect in the apical anterior, septal and apical wall. Low risk. No ischemia. The defect suspicious is suspicious of occult thinning as there was no regional wall motion abnormality noted. LOW RISK    Current Meds  Medication Sig  . aspirin EC 81 MG tablet Take 81 mg by mouth daily.  . benazepril (LOTENSIN) 20 MG tablet Take 1 tablet (20 mg total) by mouth daily.  . fenofibrate (TRICOR) 145 MG tablet Take 1 tablet (145 mg total) by mouth daily.  . fish oil-omega-3 fatty acids 1000 MG capsule Take 1 g by mouth 2 (two) times daily.   . metoprolol succinate (TOPROL-XL) 50 MG 24 hr tablet TAKE 1 TABLET BY MOUTH EVERY DAY WITH OR IMMEDIATELY FOLLOWING A MEAL  . niacin (NIASPAN) 1000 MG CR tablet Take 1 tablet (1,000 mg total) by mouth at bedtime.  . RABEprazole (ACIPHEX) 20 MG tablet Take 1 tablet (20 mg total) by mouth daily.  . sildenafil (VIAGRA) 100 MG tablet TAKE 1 TABLET BY MOUTH EVERY DAY AS NEEDED  . simvastatin (ZOCOR) 40 MG tablet Take 1 tablet (40 mg total) by mouth daily.   No Known Allergies  Social History   Tobacco Use  . Smoking status: Former Smoker    Types: Cigarettes    Last attempt to quit: 10/26/2003    Years since quitting: 14.2  . Smokeless tobacco: Never Used  Substance Use Topics  . Alcohol use: Yes    Comment: occas  . Drug use: No   Social History   Social History Narrative   Divorced father of one, grandfather 2. No longer smokes -- quit in 2005. Works for [employer] in [place]. Does not get routine activity/exercise -- Basically, he is not motivated to do any exercise. He, by himself, is admittedly lazy. Does not drink alcohol.     Family History  Problem Relation Age of  Onset  . Hyperlipidemia Father   . Cancer Maternal Grandmother   . Cancer Paternal Grandfather     Wt Readings from Last 3 Encounters:  01/11/18 195 lb 9.6 oz (88.7 kg)  11/06/17 204 lb (92.5 kg)  01/29/17 196 lb 6.4 oz (89.1 kg)    PHYSICAL EXAM BP 102/76   Pulse 76   Ht 5\' 7"  (1.702 m)   Wt 195 lb 9.6 oz (88.7 kg)   BMI 30.64 kg/m   Physical Exam  Constitutional: He is oriented to person, place, and time. He appears well-developed. No distress.  Well-nourished, well-groomed.  Healthy-appearing.  Mildly obese  HENT:  Head: Normocephalic and atraumatic.  Eyes: EOM are normal.  Neck: No hepatojugular reflux and no JVD present. Carotid bruit is not present.  Cardiovascular: Normal rate, regular rhythm, intact distal pulses and normal pulses.  No extrasystoles are present. PMI is not  displaced. Exam reveals no gallop and no friction rub.  No murmur heard. Pulmonary/Chest: Effort normal and breath sounds normal. No respiratory distress. He has no wheezes. He has no rales.  Abdominal: Soft. Bowel sounds are normal. He exhibits no distension. There is no abdominal tenderness. There is no rebound.  Musculoskeletal: Normal range of motion.        General: No edema.  Neurological: He is alert and oriented to person, place, and time.  Skin: Skin is warm and dry.  Psychiatric: He has a normal mood and affect. His behavior is normal. Judgment and thought content normal.  Nursing note and vitals reviewed.   Adult ECG Report  Rate: 73 ;  Rhythm: normal sinus rhythm and IRBB to be otherwise normal axis, intervals and durations.;   Narrative Interpretation: stable EKG  Other studies Reviewed: Additional studies/ records that were reviewed today include:  Recent Labs:   Lab Results  Component Value Date   CHOL 97 12/19/2017   HDL 41.00 12/19/2017   LDLCALC 42 12/19/2017   LDLDIRECT 60.0 11/06/2017   TRIG 68.0 12/19/2017   CHOLHDL 2 12/19/2017     ASSESSMENT / PLAN: Problem List  Items Addressed This Visit    Atherosclerosis of native coronary artery without angina pectoris (Chronic)    No recurrent anginal symptoms.  Is now getting to the point where he is to for having a stress test done for DOT physical. Plan GXT.  Most recent evaluation was a nuclear stress test.  Would not need a repeat nuclear stress test.  Newly started on fenofibrate along with Niaspan and simvastatin.  Is on aspirin, has relatively low blood pressures on benazepril and beta-blocker.      Relevant Orders   EKG 12-Lead   EXERCISE TOLERANCE TEST (ETT)   CAD  - S/P urgent CABG x 2 for subtotal occlusion of the LAD in a stented segment that was unable to be recanalized percutaneously (Chronic)    Negative Myoview in 2017.  Due for GXT follow-up now for DOT.Marland Kitchen  He did fine on the GXT portion of the Myoview. No recurrent angina.  On stable regimen.      Dyslipidemia, at goal LDL below 70 (Chronic)    With a very interesting combination medications, he has relatively well controlled lipids.  Now on fenofibrate and niacin along with simvastatin and doing well. May want to consider converting from simvastatin to atorvastatin or simvastatin to avoid interactions with fenofibrate, monitor for signs of myalgias or worsening liver function.      Encounter for Department of Transportation (DOT) examination for driving license renewal - Primary   Relevant Orders   EKG 12-Lead   EXERCISE TOLERANCE TEST (ETT)   Essential hypertension (Chronic)    On beta-blocker and ACE inhibitor.  Doing well.  Stable blood pressures.         Current medicines are reviewed at length with the patient today. (+/- concerns) none The following changes have been made: none  Patient Instructions  Medication Instructions:  NO CHANGES If you need a refill on your cardiac medications before your next appointment, please call your pharmacy.   Lab work: NOT NEEDED If you have labs (blood work) drawn today and your  tests are completely normal, you will receive your results only by: Marland Kitchen MyChart Message (if you have MyChart) OR . A paper copy in the mail If you have any lab test that is abnormal or we need to change your treatment, we will call you  to review the results.  Testing/Procedures:  SCHEDULE AT [place] Your physician has requested that you have an exercise tolerance test. For further information please visit [email or web address]. Please also follow instruction sheet, as given.    Follow-Up: At [facility], you and your health needs are our priority.  As part of our continuing mission to provide you with exceptional heart care, we have created designated Provider Care Teams.  These Care Teams include your primary Cardiologist (physician) and Advanced Practice Providers (APPs -  Physician Assistants and Nurse Practitioners) who all work together to provide you with the care you need, when you need it. You will need a follow up appointment in 12 months JAN 2021.  Please call our office 2 months in advance to schedule this appointment.  You may see Glenetta Hew, MD or one of the following Advanced Practice Providers on your designated Care Team:   Rosaria Ferries, PA-C . Jory Sims, DNP, ANP  Any Other Special Instructions Will Be Listed Below (If Applicable).      Studies Ordered:   Orders Placed This Encounter  Procedures  . EXERCISE TOLERANCE TEST (ETT)  . EKG 12-Lead      Glenetta Hew, M.D., M.S. Interventional Cardiologist   Pager # [phone] Phone # [phone] [address] [place], [place] [address]

## 2018-01-14 NOTE — Assessment & Plan Note (Addendum)
No recurrent anginal symptoms.  Is now getting to the point where he is to for having a stress test done for DOT physical. Plan GXT.  Most recent evaluation was a nuclear stress test.  Would not need a repeat nuclear stress test.  Newly started on fenofibrate along with Niaspan and simvastatin.  Is on aspirin, has relatively low blood pressures on benazepril and beta-blocker.

## 2018-01-14 NOTE — Assessment & Plan Note (Signed)
On beta-blocker and ACE inhibitor.  Doing well.  Stable blood pressures.

## 2018-01-14 NOTE — Assessment & Plan Note (Signed)
With a very interesting combination medications, he has relatively well controlled lipids.  Now on fenofibrate and niacin along with simvastatin and doing well. May want to consider converting from simvastatin to atorvastatin or simvastatin to avoid interactions with fenofibrate, monitor for signs of myalgias or worsening liver function.

## 2018-01-14 NOTE — Assessment & Plan Note (Signed)
Negative Myoview in 2017.  Due for GXT follow-up now for DOT.Marland Kitchen  He did fine on the GXT portion of the Myoview. No recurrent angina.  On stable regimen.

## 2018-01-15 ENCOUNTER — Other Ambulatory Visit: Payer: Self-pay | Admitting: Cardiology

## 2018-01-18 ENCOUNTER — Other Ambulatory Visit: Payer: Self-pay | Admitting: Cardiology

## 2018-02-02 ENCOUNTER — Other Ambulatory Visit: Payer: Self-pay | Admitting: Physician Assistant

## 2018-02-02 DIAGNOSIS — E781 Pure hyperglyceridemia: Secondary | ICD-10-CM

## 2018-02-09 ENCOUNTER — Other Ambulatory Visit: Payer: Self-pay | Admitting: Cardiology

## 2018-03-10 ENCOUNTER — Other Ambulatory Visit: Payer: Self-pay | Admitting: Cardiology

## 2018-04-23 ENCOUNTER — Telehealth: Payer: Self-pay | Admitting: Internal Medicine

## 2018-04-23 DIAGNOSIS — Z024 Encounter for examination for driving license: Secondary | ICD-10-CM

## 2018-04-23 DIAGNOSIS — Z951 Presence of aortocoronary bypass graft: Secondary | ICD-10-CM

## 2018-04-23 DIAGNOSIS — I2510 Atherosclerotic heart disease of native coronary artery without angina pectoris: Secondary | ICD-10-CM

## 2018-04-23 NOTE — Telephone Encounter (Signed)
Reviewed    Set up for GXT for DOT physical    Left message on VM that this will need to be rescheduled for later in May or early June due to [place] pandemic and restrictions Michela Pitcher someone would call

## 2018-04-24 NOTE — Telephone Encounter (Signed)
Need to make sure that this will not adversely affect his work.  Hopefully, DOT physical evaluations are also being delayed.   Glenetta Hew, MD

## 2018-04-24 NOTE — Telephone Encounter (Signed)
Please contact patient Re DOT physical   ? When is last date it can be done from work perspective Unable to reach on phone

## 2018-04-25 NOTE — Telephone Encounter (Signed)
Below is helpful information to provide to pt re: extension for DOT physicals during the coronavirus pandemic: ------------------------------------------------------------------------------------------------- During the COVID crisis we can provide the following number for patients to call and request an extension date for their physical.  The number is [phone], option for Medical Dept.   This is Dr. Allison Quarry pt, will route to his primary nurse for review and follow up.

## 2018-05-01 ENCOUNTER — Encounter (HOSPITAL_COMMUNITY): Payer: BLUE CROSS/BLUE SHIELD

## 2018-05-01 NOTE — Telephone Encounter (Signed)
LEFT MESSAGE ON VOICE MAIL - WE WILL BE  CHANGING GXT  TO [place] - ONCE  TEST ARE ABLE TO BE SCHEDULE OFFICE WILL CONTACT YOU

## 2018-05-01 NOTE — Addendum Note (Signed)
Addended by: Raiford Simmonds on: 05/01/2018 06:30 PM   Modules accepted: Orders

## 2018-05-03 HISTORY — PX: NM MYOVIEW LTD: HXRAD82

## 2018-05-08 ENCOUNTER — Other Ambulatory Visit: Payer: Self-pay

## 2018-05-08 ENCOUNTER — Ambulatory Visit (HOSPITAL_COMMUNITY): Payer: BLUE CROSS/BLUE SHIELD | Attending: Cardiology

## 2018-05-08 DIAGNOSIS — I2510 Atherosclerotic heart disease of native coronary artery without angina pectoris: Secondary | ICD-10-CM | POA: Diagnosis not present

## 2018-05-08 DIAGNOSIS — Z024 Encounter for examination for driving license: Secondary | ICD-10-CM | POA: Insufficient documentation

## 2018-05-08 DIAGNOSIS — Z951 Presence of aortocoronary bypass graft: Secondary | ICD-10-CM | POA: Insufficient documentation

## 2018-05-08 MED ORDER — TECHNETIUM TC 99M TETROFOSMIN IV KIT
31.8000 | PACK | Freq: Once | INTRAVENOUS | Status: AC | PRN
  Administered 2018-05-08: 31.8 via INTRAVENOUS
  Filled 2018-05-08: qty 32

## 2018-05-08 MED ORDER — REGADENOSON 0.4 MG/5ML IV SOLN
0.4000 mg | Freq: Once | INTRAVENOUS | Status: AC
  Administered 2018-05-08: 0.4 mg via INTRAVENOUS

## 2018-05-08 MED ORDER — TECHNETIUM TC 99M TETROFOSMIN IV KIT
10.2000 | PACK | Freq: Once | INTRAVENOUS | Status: AC | PRN
  Administered 2018-05-08: 10.2 via INTRAVENOUS
  Filled 2018-05-08: qty 11

## 2018-05-09 LAB — MYOCARDIAL PERFUSION IMAGING
LV dias vol: 81 mL (ref 62–150)
LV sys vol: 44 mL
Peak HR: 98 {beats}/min
Rest HR: 65 {beats}/min
SDS: 1
SRS: 1
SSS: 2
TID: 1.03

## 2018-05-12 NOTE — Telephone Encounter (Signed)
Error

## 2018-05-14 ENCOUNTER — Telehealth: Payer: Self-pay | Admitting: *Deleted

## 2018-05-14 DIAGNOSIS — R931 Abnormal findings on diagnostic imaging of heart and coronary circulation: Secondary | ICD-10-CM

## 2018-05-14 DIAGNOSIS — I2510 Atherosclerotic heart disease of native coronary artery without angina pectoris: Secondary | ICD-10-CM

## 2018-05-14 DIAGNOSIS — Z951 Presence of aortocoronary bypass graft: Secondary | ICD-10-CM

## 2018-05-14 NOTE — Telephone Encounter (Signed)
The patient has been notified of the result and verbalized understanding.  All questions (if any) were answered. Raiford Simmonds, RN 05/14/2018 11:34 AM  Aware echo is needed. Per Dr Ellyn Hack request

## 2018-05-14 NOTE — Telephone Encounter (Signed)
Left message to call back - need to given result and schedule for echo , per dr Ellyn Hack request.

## 2018-05-14 NOTE — Telephone Encounter (Signed)
-----   Message from Leonie Man, MD sent at 05/09/2018  5:16 PM EDT ----- Doristine Devoid news -- Nuclear ST results: Low Normal Pump function (May want to check 2D Echo to correlate). No evidence of prior heart attack or new significant heart artery blockages.  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  Glenetta Hew, MD

## 2018-05-14 NOTE — Telephone Encounter (Signed)
Follow up  ° ° °Pt is returning phone call  °Please call back  °

## 2018-07-03 ENCOUNTER — Ambulatory Visit: Payer: Self-pay

## 2018-07-03 NOTE — Telephone Encounter (Signed)
Patient called in c/o dizziness when up and moving around. Stated that he had a cup of coffee this morning and nothing else to eat or drink. Stated that this usually happens in the morning but goes away thru out the morning. It did not go away this morning. 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 Unable to check BP at work. Per PCP, patient was instructed to go to either urgent care or ER. Patient became upset, stating that he was trying to "avoid going there because last time I went, I was there for 10 hours before they called me back and my appendix ruptured." I apologized to patient but explained with his history of hypertension, it was in his best interest to go to either urgent care or ER.

## 2018-07-03 NOTE — Telephone Encounter (Signed)
Patient called and says he's dizzy, since 1000 this morning. 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 He says he's sitting now and when he stands, the room spins and he feels woozy headed, off balance. He says he's able to walk without falling. He denies any other symptoms. I called the office and spoke to [place], [facility] who asked to speak to the patient, the call was connected successfully.  Answer Assessment - Initial Assessment Questions 1. DESCRIPTION: "Describe your dizziness."     Woozy, off balance 2. VERTIGO: "Do you feel like either you or the room is spinning or tilting?"      Yes when stand 3. LIGHTHEADED: "Do you feel lightheaded?" (e.g., somewhat faint, woozy, weak upon standing)     Yes 4. SEVERITY: "How bad is it?"  "Can you walk?"   - MILD - Feels unsteady but walking normally.   - MODERATE - Feels very unsteady when walking, but not falling; interferes with normal activities (e.g., school, work) .   - SEVERE - Unable to walk without falling (requires assistance).     Moderate 5. ONSET:  "When did the dizziness begin?"     About 1000 this morning  6. AGGRAVATING FACTORS: "Does anything make it worse?" (e.g., standing, change in head position)     Standing 7. CAUSE: "What do you think is causing the dizziness?"     I don't know 8. RECURRENT SYMPTOM: "Have you had dizziness before?" If so, ask: "When was the last time?" "What happened that time?"     Yes, when drink too much coffee 9. OTHER SYMPTOMS: "Do you have any other symptoms?" (e.g., headache, weakness, numbness, vomiting, earache)     No 10. PREGNANCY: "Is there any chance you are pregnant?" "When was your last menstrual period?"      N/A  Protocols used: DIZZINESS - VERTIGO-A-AH

## 2018-07-28 ENCOUNTER — Other Ambulatory Visit: Payer: Self-pay | Admitting: Physician Assistant

## 2018-07-28 DIAGNOSIS — E781 Pure hyperglyceridemia: Secondary | ICD-10-CM

## 2018-07-29 NOTE — Telephone Encounter (Signed)
Patient is due for 6 month follow up

## 2018-07-29 NOTE — Telephone Encounter (Signed)
Called patient and lmovm to call the office to schedule an appointment for recheck of cholesterol levels.

## 2018-07-30 ENCOUNTER — Other Ambulatory Visit: Payer: Self-pay | Admitting: Emergency Medicine

## 2018-07-30 DIAGNOSIS — E781 Pure hyperglyceridemia: Secondary | ICD-10-CM

## 2018-07-30 MED ORDER — FENOFIBRATE 145 MG PO TABS: 145.0000 mg | ORAL_TABLET | Freq: Every day | ORAL | 0 refills | Status: DC

## 2018-07-30 NOTE — Telephone Encounter (Signed)
Patient called back to "schedule labs"  I looked at the note to see that it said he needed a follow-up.  Patient does not understand why he needs an appointment with [facility]. He states that he normally just comes once a year so he wants to know why all of a sudden it's 6 months. He is asking for a call back to explain.

## 2018-07-30 NOTE — Telephone Encounter (Signed)
Advised patient that per PCP ok to refill medication until appointment in Nov for CPE. Patient is agreeable.No more refills until appointment,

## 2018-09-19 ENCOUNTER — Other Ambulatory Visit: Payer: Self-pay | Admitting: Physician Assistant

## 2018-09-19 DIAGNOSIS — E781 Pure hyperglyceridemia: Secondary | ICD-10-CM

## 2018-10-08 ENCOUNTER — Other Ambulatory Visit: Payer: Self-pay

## 2018-10-08 DIAGNOSIS — Z20822 Contact with and (suspected) exposure to covid-19: Secondary | ICD-10-CM

## 2018-10-10 LAB — NOVEL CORONAVIRUS, NAA: SARS-CoV-2, NAA: NOT DETECTED

## 2018-10-22 ENCOUNTER — Other Ambulatory Visit: Payer: Self-pay | Admitting: Cardiology

## 2018-11-14 ENCOUNTER — Other Ambulatory Visit: Payer: Self-pay

## 2018-11-14 ENCOUNTER — Encounter: Payer: Self-pay | Admitting: Physician Assistant

## 2018-11-14 ENCOUNTER — Ambulatory Visit (INDEPENDENT_AMBULATORY_CARE_PROVIDER_SITE_OTHER): Payer: BC Managed Care – PPO | Admitting: Physician Assistant

## 2018-11-14 VITALS — BP 110/82 | HR 88 | Temp 97.9°F | Resp 16 | Ht 67.0 in | Wt 202.0 lb

## 2018-11-14 DIAGNOSIS — Z125 Encounter for screening for malignant neoplasm of prostate: Secondary | ICD-10-CM

## 2018-11-14 DIAGNOSIS — Z1211 Encounter for screening for malignant neoplasm of colon: Secondary | ICD-10-CM

## 2018-11-14 DIAGNOSIS — I10 Essential (primary) hypertension: Secondary | ICD-10-CM | POA: Diagnosis not present

## 2018-11-14 DIAGNOSIS — E785 Hyperlipidemia, unspecified: Secondary | ICD-10-CM | POA: Diagnosis not present

## 2018-11-14 DIAGNOSIS — Z0000 Encounter for general adult medical examination without abnormal findings: Secondary | ICD-10-CM

## 2018-11-14 LAB — PSA: PSA: 1.63 ng/mL (ref 0.10–4.00)

## 2018-11-14 LAB — CBC WITH DIFFERENTIAL/PLATELET
Basophils Absolute: 0 10*3/uL (ref 0.0–0.1)
Basophils Relative: 0.4 % (ref 0.0–3.0)
Eosinophils Absolute: 0.1 10*3/uL (ref 0.0–0.7)
Eosinophils Relative: 1.6 % (ref 0.0–5.0)
HCT: 51 % (ref 39.0–52.0)
Hemoglobin: 16.9 g/dL (ref 13.0–17.0)
Lymphocytes Relative: 32.7 % (ref 12.0–46.0)
Lymphs Abs: 2 10*3/uL (ref 0.7–4.0)
MCHC: 33.1 g/dL (ref 30.0–36.0)
MCV: 88.2 fl (ref 78.0–100.0)
Monocytes Absolute: 0.6 10*3/uL (ref 0.1–1.0)
Monocytes Relative: 9.1 % (ref 3.0–12.0)
Neutro Abs: 3.4 10*3/uL (ref 1.4–7.7)
Neutrophils Relative %: 56.2 % (ref 43.0–77.0)
Platelets: 264 10*3/uL (ref 150.0–400.0)
RBC: 5.78 Mil/uL (ref 4.22–5.81)
RDW: 14 % (ref 11.5–15.5)
WBC: 6.1 10*3/uL (ref 4.0–10.5)

## 2018-11-14 LAB — COMPREHENSIVE METABOLIC PANEL
ALT: 35 U/L (ref 0–53)
AST: 26 U/L (ref 0–37)
Albumin: 4.3 g/dL (ref 3.5–5.2)
Alkaline Phosphatase: 44 U/L (ref 39–117)
BUN: 13 mg/dL (ref 6–23)
CO2: 23 mEq/L (ref 19–32)
Calcium: 9.6 mg/dL (ref 8.4–10.5)
Chloride: 106 mEq/L (ref 96–112)
Creatinine, Ser: 1.15 mg/dL (ref 0.40–1.50)
GFR: 64.05 mL/min (ref >60.00)
Glucose, Bld: 104 mg/dL — ABNORMAL HIGH (ref 70–99)
Potassium: 4.6 mEq/L (ref 3.5–5.1)
Sodium: 137 mEq/L (ref 135–145)
Total Bilirubin: 0.8 mg/dL (ref 0.2–1.2)
Total Protein: 6.8 g/dL (ref 6.0–8.3)

## 2018-11-14 LAB — LIPID PANEL
Cholesterol: 122 mg/dL (ref 0–200)
HDL: 40.1 mg/dL (ref >39.00)
LDL Cholesterol: 55 mg/dL (ref 0–99)
NonHDL: 81.65
Total CHOL/HDL Ratio: 3
Triglycerides: 133 mg/dL (ref 0.0–149.0)
VLDL: 26.6 mg/dL (ref 0.0–40.0)

## 2018-11-14 LAB — HEMOGLOBIN A1C: Hgb A1c MFr Bld: 6.1 % (ref 4.6–6.5)

## 2018-11-14 NOTE — Patient Instructions (Signed)
Please go to the lab for blood work.   Our office will call you with your results unless you have chosen to receive results via MyChart.  If your blood work is normal we will follow-up each year for physicals and as scheduled for chronic medical problems.  If anything is abnormal we will treat accordingly and get you in for a follow-up.  Consider starting topical Voltaren gel for shoulders and elbow to help with mild inflammation.   For the area in the groin -- keep skin clean and dry. After "manscaping" you need to apply a gentle astringent like witch hazel to help prevent repeat episodes of folliculitis in the area.   Please follow-up with your Cardiologist as scheduled.   Preventive Care 64-28 Years Old, Male Preventive care refers to lifestyle choices and visits with your health care provider that can promote health and wellness. This includes:  A yearly physical exam. This is also called an annual well check.  Regular dental and eye exams.  Immunizations.  Screening for certain conditions.  Healthy lifestyle choices, such as eating a healthy diet, getting regular exercise, not using drugs or products that contain nicotine and tobacco, and limiting alcohol use. What can I expect for my preventive care visit? Physical exam Your health care provider will check:  Height and weight. These may be used to calculate body mass index (BMI), which is a measurement that tells if you are at a healthy weight.  Heart rate and blood pressure.  Your skin for abnormal spots. Counseling Your health care provider may ask you questions about:  Alcohol, tobacco, and drug use.  Emotional well-being.  Home and relationship well-being.  Sexual activity.  Eating habits.  Work and work Statistician. What immunizations do I need?  Influenza (flu) vaccine  This is recommended every year. Tetanus, diphtheria, and pertussis (Tdap) vaccine  You may need a Td booster every 10 years.  Varicella (chickenpox) vaccine  You may need this vaccine if you have not already been vaccinated. Zoster (shingles) vaccine  You may need this after age 61. Measles, mumps, and rubella (MMR) vaccine  You may need at least one dose of MMR if you were born in 1957 or later. You may also need a second dose. Pneumococcal conjugate (PCV13) vaccine  You may need this if you have certain conditions and were not previously vaccinated. Pneumococcal polysaccharide (PPSV23) vaccine  You may need one or two doses if you smoke cigarettes or if you have certain conditions. Meningococcal conjugate (MenACWY) vaccine  You may need this if you have certain conditions. Hepatitis A vaccine  You may need this if you have certain conditions or if you travel or work in places where you may be exposed to hepatitis A. Hepatitis B vaccine  You may need this if you have certain conditions or if you travel or work in places where you may be exposed to hepatitis B. Haemophilus influenzae type b (Hib) vaccine  You may need this if you have certain risk factors. Human papillomavirus (HPV) vaccine  If recommended by your health care provider, you may need three doses over 6 months. You may receive vaccines as individual doses or as more than one vaccine together in one shot (combination vaccines). Talk with your health care provider about the risks and benefits of combination vaccines. What tests do I need? Blood tests  Lipid and cholesterol levels. These may be checked every 5 years, or more frequently if you are over 47 years old.  Hepatitis C test.  Hepatitis B test. Screening  Lung cancer screening. You may have this screening every year starting at age 1 if you have a 30-pack-year history of smoking and currently smoke or have quit within the past 15 years.  Prostate cancer screening. Recommendations will vary depending on your family history and other risks.  Colorectal cancer screening. All  adults should have this screening starting at age 73 and continuing until age 76. Your health care provider may recommend screening at age 58 if you are at increased risk. You will have tests every 1-10 years, depending on your results and the type of screening test.  Diabetes screening. This is done by checking your blood sugar (glucose) after you have not eaten for a while (fasting). You may have this done every 1-3 years.  Sexually transmitted disease (STD) testing. Follow these instructions at home: Eating and drinking  Eat a diet that includes fresh fruits and vegetables, whole grains, lean protein, and low-fat dairy products.  Take vitamin and mineral supplements as recommended by your health care provider.  Do not drink alcohol if your health care provider tells you not to drink.  If you drink alcohol: ? Limit how much you have to 0-2 drinks a day. ? Be aware of how much alcohol is in your drink. In the [place] one drink equals one 12 oz bottle of beer (355 mL), one 5 oz glass of wine (148 mL), or one 1 oz glass of hard liquor (44 mL). Lifestyle  Take daily care of your teeth and gums.  Stay active. Exercise for at least 30 minutes on 5 or more days each week.  Do not use any products that contain nicotine or tobacco, such as cigarettes, e-cigarettes, and chewing tobacco. If you need help quitting, ask your health care provider.  If you are sexually active, practice safe sex. Use a condom or other form of protection to prevent STIs (sexually transmitted infections).  Talk with your health care provider about taking a low-dose aspirin every day starting at age 31. What's next?  Go to your health care provider once a year for a well check visit.  Ask your health care provider how often you should have your eyes and teeth checked.  Stay up to date on all vaccines. This information is not intended to replace advice given to you by your health care provider. Make sure you discuss  any questions you have with your health care provider. Document Released: 01/15/2015 Document Revised: 12/13/2017 Document Reviewed: 12/13/2017 Elsevier Patient Education  2020 [employer].

## 2018-11-14 NOTE — Progress Notes (Signed)
Patient presents to clinic today for annual exam.  Patient is fasting for labs.  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 Does try to avoid fat food restaurants. Has fridge and microwave in his truck Exercise -- Stays extremely active with work  Acute Concerns: Patient endorses areas in R suprapubic region that was red and tender with mild drainage. Resolved but has noted darker skin pigmentation. Would like assessed today.   Chronic Issues: CAD s/p dilation and stent placement, hypertension and hyperlipidemia -- Followed by Cardiology. Is currently on a regimen of Metoprolol XL 50 mg QD, Benazepril 20 mg QD, ASA 81 mg QD, Niacin 1000 mg QD, Simvastatin 40 mg QD. Was also placed on fenofibrate 145 mg due to hypertriglyceridemia. Repeat labs showed marked improvement.   Health Maintenance: Immunizations -- Colonoscopy -- Overdue. Referral placed back to   Past Medical History:  Diagnosis Date  . CAD in native artery   . Coronary stent restenosis due to progression of disease April 2011   Subtotal occlusion of the LAD with ISR, unable to cross with wire; aneurysmal dilation of the LAD prior to stent  . Dyslipidemia, goal LDL below 70   . Erectile dysfunction   . History of GI bleed    No recurrence  . History of tobacco abuse    Quit prior to CABG  . History of vertigo   . Hypertension   . 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 ST elevation (STEMI) myocardial infarction involving left anterior descending coronary artery [facility]) October 2005   LAD PCI with 2 overlapping Cypher DES 3.0 mm 23 mm    Past Surgical History:  Procedure Laterality Date  . CARDIAC CATHETERIZATION  04/28/2009   Subtotal occlusion of mid LAD with ISR/thrombosis, unable to pass --> urgent CABG (Dr. Pecola Lawless)  . CORONARY ANGIOPLASTY WITH STENT PLACEMENT  10/26/2003   anterior ischemia  with significant reversibility (mid anterior and anteroseptal, apical anterior and apical anteroseptal)  - Cypher 3x71m stent to mid LAD and Cypher 3x241mCypher DES to prox LAD(Dr. R.Gerrie Nordmann . CORONARY ARTERY BYPASS GRAFT  04/2009   LIMA-LAD, reverse SVG-distal RCA (Dr. S.Chauncey CruelHendrickson)  . DOPPLER ECHOCARDIOGRAPHY  04/2009   EF 50-50%, borderline septal hypokinesis  . Exercise Tolerance Test  June 20/15   Exercise 8:05 Min, 10.1 METs, reached 99% max. Peak HR -- 160 bpm; NEGATIVE Bruce TM GXT = no EKG evidence of ischemia. Prolonged heart rate and blood pressure recovery and PVCs noted that resolve with Exerction.  . Marland KitchenM MYOVIEW LTD  04/2009; 06/2009   a. For DOT physical -- Anterior ischemia with significant reversibility (mid anterior and anteroseptal, apical anterior and apical anteroseptal;; b. Post CABG: No ischemia, attenuation artifact and septal wall. No ischemia  . NM MYOVIEW LTD  04/2015   EF 55%. Small sized mild severity defect in the apical anterior, septal and apical wall. Low risk. No ischemia. The defect suspicious is suspicious of occult thinning as there was no regional wall motion abnormality noted. LOW RISK    Current Outpatient Medications on File Prior to Visit  Medication Sig Dispense Refill  . aspirin EC 81 MG tablet Take 81 mg by mouth daily.    . benazepril (LOTENSIN) 20 MG tablet TAKE 1 TABLET BY MOUTH EVERY DAY 90 tablet 3  . fenofibrate (TRICOR) 145 MG tablet  TAKE 1 TABLET BY MOUTH EVERY DAY 90 tablet 0  . fish oil-omega-3 fatty acids 1000 MG capsule Take 1 g by mouth 2 (two) times daily.     . metoprolol succinate (TOPROL-XL) 50 MG 24 hr tablet TAKE 1 TABLET BY MOUTH EVERY DAY WITH OR IMMEDIATELY FOLLOWING A MEAL 90 tablet 3  . niacin (NIASPAN) 1000 MG CR tablet TAKE 1 TABLET AT BEDTIME 90 tablet 3  . RABEprazole (ACIPHEX) 20 MG tablet TAKE 1 TABLET BY MOUTH EVERY DAY 90 tablet 3  . sildenafil (VIAGRA) 100 MG tablet TAKE 1 TABLET BY MOUTH EVERY DAY AS NEEDED 4  tablet 17  . simvastatin (ZOCOR) 40 MG tablet TAKE 1 TABLET BY MOUTH EVERY DAY 90 tablet 3   No current facility-administered medications on file prior to visit.     No Known Allergies  Family History  Problem Relation Age of Onset  . Hyperlipidemia Father   . Cancer Maternal Grandmother   . Cancer Paternal Grandfather     Social History   Socioeconomic History  . Marital status: Divorced    Spouse name: Not on file  . Number of children: 1  . Years of education: 50  . Highest education level: Not on file  Occupational History    Employer: [employer]  . Financial resource strain: Not on file  . Food insecurity    Worry: Not on file    Inability: Not on file  . Transportation needs    Medical: Not on file    Non-medical: Not on file  Tobacco Use  . Smoking status: Former Smoker    Types: Cigarettes    Quit date: 10/26/2003    Years since quitting: 15.0  . Smokeless tobacco: Never Used  Substance and Sexual Activity  . Alcohol use: Yes    Comment: occas  . Drug use: No  . Sexual activity: Yes    Birth control/protection: Condom  Lifestyle  . Physical activity    Days per week: Not on file    Minutes per session: Not on file  . Stress: Not on file  Relationships  . Social Herbalist on phone: Not on file    Gets together: Not on file    Attends religious service: Not on file    Active member of club or organization: Not on file    Attends meetings of clubs or organizations: Not on file    Relationship status: Not on file  . Intimate partner violence    Fear of current or ex partner: Not on file    Emotionally abused: Not on file    Physically abused: Not on file    Forced sexual activity: Not on file  Other Topics Concern  . Not on file  Social History Narrative   Divorced father of one, grandfather 2. No longer smokes -- quit in 2005. Works for [employer] in [place]. Does not get routine  activity/exercise -- Basically, he is not motivated to do any exercise. He, by himself, is admittedly lazy. Does not drink alcohol.   Review of Systems  Constitutional: Negative for fever and weight loss.  HENT: Negative for ear discharge, ear pain, hearing loss and tinnitus.   Eyes: Negative for blurred vision, double vision, photophobia and pain.  Respiratory: Negative for cough and shortness of breath.   Cardiovascular: Negative for chest pain and palpitations.  Gastrointestinal: Negative for abdominal pain, blood in stool, constipation, diarrhea, heartburn, melena,  nausea and vomiting.  Genitourinary: Negative for dysuria, flank pain, frequency, hematuria and urgency.  Musculoskeletal: Positive for joint pain (shoulders intermittent, soreness after long work days). Negative for falls.  Neurological: Negative for dizziness, loss of consciousness and headaches.  Endo/Heme/Allergies: Negative for environmental allergies.  Psychiatric/Behavioral: Negative for depression, hallucinations, substance abuse and suicidal ideas. The patient is not nervous/anxious and does not have insomnia.    BP 110/82   Pulse 88   Temp 97.9 F (36.6 C) (Temporal)   Resp 16   Ht 5' 7"  (1.702 m)   Wt 202 lb (91.6 kg)   SpO2 98%   BMI 31.64 kg/m   Physical Exam Vitals signs reviewed.  Constitutional:      General: He is not in acute distress.    Appearance: Normal appearance. He is well-developed. He is not diaphoretic.  HENT:     Head: Normocephalic and atraumatic.     Right Ear: Tympanic membrane, ear canal and external ear normal.     Left Ear: Tympanic membrane, ear canal and external ear normal.     Nose: Nose normal.     Mouth/Throat:     Pharynx: No posterior oropharyngeal erythema.  Eyes:     Conjunctiva/sclera: Conjunctivae normal.     Pupils: Pupils are equal, round, and reactive to light.  Neck:     Musculoskeletal: Neck supple.     Thyroid: No thyromegaly.  Cardiovascular:     Rate  and Rhythm: Normal rate and regular rhythm.     Heart sounds: Normal heart sounds.  Pulmonary:     Effort: Pulmonary effort is normal. No respiratory distress.     Breath sounds: Normal breath sounds. No wheezing or rales.  Chest:     Chest wall: No tenderness.  Abdominal:     General: Bowel sounds are normal. There is no distension.     Palpations: Abdomen is soft. There is no mass.     Tenderness: There is no abdominal tenderness. There is no guarding or rebound.  Genitourinary:   Lymphadenopathy:     Cervical: No cervical adenopathy.  Skin:    General: Skin is warm and dry.     Findings: No rash.  Neurological:     Mental Status: He is alert and oriented to person, place, and time.     Cranial Nerves: No cranial nerve deficit.  Psychiatric:        Mood and Affect: Mood normal.     Recent Results (from the past 2160 hour(s))  Novel Coronavirus, NAA ([facility])     Status: None   Collection Time: 10/08/18 12:00 AM   Specimen: Nasopharyngeal(NP) swabs in vial transport medium   NASOPHARYNGE  TESTING  Result Value Ref Range   SARS-CoV-2, NAA Not Detected Not Detected    Comment: This nucleic acid amplification test was developed and its performance characteristics determined by [employer]. Nucleic acid amplification tests include PCR and TMA. This test has not been FDA cleared or approved. This test has been authorized by FDA under an Emergency Use Authorization (EUA). This test is only authorized for the duration of time the declaration that circumstances exist justifying the authorization of the emergency use of in vitro diagnostic tests for detection of SARS-CoV-2 virus and/or diagnosis of COVID-19 infection under section 564(b)(1) of the Act, 21 U.S.C. 175ZWC-5(E) (1), unless the authorization is terminated or revoked sooner. When diagnostic testing is negative, the possibility of a false negative result should be considered in the context of a patient's  recent exposures and the presence of clinical signs and symptoms consistent with COVID-19. An individual without symptoms of COVID-19 and who is not shedding SARS-CoV-2 virus would  expect to have a negative (not detected) result in this assay.     Assessment/Plan: 1. Visit for preventive health examination Depression screen negative. Health Maintenance reviewed. Preventive schedule discussed and handout given in AVS. Will obtain fasting labs today.  - Comp Met (CMET) - CBC w/Diff - Hemoglobin A1c - Lipid Profile  2. Essential hypertension BP normotensive. Asymptomatic. Continue management per Cardiology. Repeat labs today. - Comp Met (CMET) - CBC w/Diff  3. Dyslipidemia, at goal LDL below 70 Taking medications as directed. Would like to come off of Fenofibrate. Will repeat fasting lipids today to assess current state and then discuss trial off of medication if things are well-controlled.  - Lipid Profile  4. Prostate cancer screening The natural history of prostate cancer and ongoing controversy regarding screening and potential treatment outcomes of prostate cancer has been discussed with the patient. The meaning of a false positive PSA and a false negative PSA has been discussed. He indicates understanding of the limitations of this screening test and wishes to proceed with screening PSA testing.  - PSA  5. Colon cancer screening Referral to GI placed again for screening colonoscopy as he did not go last year as scheduled.    Leeanne Rio, PA-C

## 2018-11-18 ENCOUNTER — Encounter: Payer: Self-pay | Admitting: Gastroenterology

## 2018-12-11 ENCOUNTER — Other Ambulatory Visit: Payer: Self-pay

## 2018-12-11 ENCOUNTER — Ambulatory Visit (AMBULATORY_SURGERY_CENTER): Payer: BC Managed Care – PPO | Admitting: *Deleted

## 2018-12-11 VITALS — Temp 96.6°F | Ht 67.0 in | Wt 210.0 lb

## 2018-12-11 DIAGNOSIS — Z1211 Encounter for screening for malignant neoplasm of colon: Secondary | ICD-10-CM

## 2018-12-11 DIAGNOSIS — Z1159 Encounter for screening for other viral diseases: Secondary | ICD-10-CM

## 2018-12-11 MED ORDER — NA SULFATE-K SULFATE-MG SULF 17.5-3.13-1.6 GM/177ML PO SOLN: ORAL | 0 refills | Status: DC

## 2018-12-11 NOTE — Progress Notes (Signed)
Patient is here in-person for PV. Patient denies any allergies to eggs or soy. Patient denies any problems with anesthesia/sedation. Patient denies any oxygen use at home. Patient denies taking any diet/weight loss medications or blood thinners. Patient is not being treated for MRSA or C-diff. EMMI education assisgned to the patient for the procedure, this was explained and instructions given to patient. COVID-19 screening test is on 12/21, at 1010 am the pt is aware. Pt is aware that care partner will wait in the car during procedure; if they feel like they will be too hot or cold to wait in the car; they may wait in the 4 th floor lobby. Patient is aware to bring only one care partner. We want them to wear a mask (we do not have any that we can provide them), practice social distancing, and we will check their temperatures when they get here.  I did remind the patient that their care partner needs to stay in the parking lot the entire time and have a cell phone available, we will call them when the pt is ready for discharge. Patient will wear mask into building.    Suprep $15 off coupon given to the patient.

## 2018-12-12 ENCOUNTER — Encounter: Payer: Self-pay | Admitting: Gastroenterology

## 2018-12-23 ENCOUNTER — Other Ambulatory Visit: Payer: Self-pay | Admitting: Gastroenterology

## 2018-12-23 ENCOUNTER — Ambulatory Visit (INDEPENDENT_AMBULATORY_CARE_PROVIDER_SITE_OTHER): Payer: BC Managed Care – PPO

## 2018-12-23 DIAGNOSIS — Z1159 Encounter for screening for other viral diseases: Secondary | ICD-10-CM | POA: Diagnosis not present

## 2018-12-24 LAB — SARS CORONAVIRUS 2 (TAT 6-24 HRS): SARS Coronavirus 2: NEGATIVE

## 2018-12-25 ENCOUNTER — Other Ambulatory Visit: Payer: Self-pay

## 2018-12-25 ENCOUNTER — Encounter: Payer: Self-pay | Admitting: Gastroenterology

## 2018-12-25 ENCOUNTER — Ambulatory Visit (AMBULATORY_SURGERY_CENTER): Payer: BC Managed Care – PPO | Admitting: Gastroenterology

## 2018-12-25 VITALS — BP 104/73 | HR 70 | Temp 97.9°F | Resp 12 | Ht 67.0 in | Wt 210.0 lb

## 2018-12-25 DIAGNOSIS — D128 Benign neoplasm of rectum: Secondary | ICD-10-CM

## 2018-12-25 DIAGNOSIS — Z1211 Encounter for screening for malignant neoplasm of colon: Secondary | ICD-10-CM | POA: Diagnosis not present

## 2018-12-25 DIAGNOSIS — D123 Benign neoplasm of transverse colon: Secondary | ICD-10-CM

## 2018-12-25 DIAGNOSIS — D120 Benign neoplasm of cecum: Secondary | ICD-10-CM | POA: Diagnosis not present

## 2018-12-25 DIAGNOSIS — D375 Neoplasm of uncertain behavior of rectum: Secondary | ICD-10-CM | POA: Diagnosis not present

## 2018-12-25 DIAGNOSIS — K635 Polyp of colon: Secondary | ICD-10-CM | POA: Diagnosis not present

## 2018-12-25 MED ORDER — SODIUM CHLORIDE 0.9 % IV SOLN: 500.0000 mL | Freq: Once | INTRAVENOUS | Status: DC

## 2018-12-25 NOTE — Op Note (Signed)
[place] Patient Name: [name] Benitez Procedure Date: 12/25/2018 11:27 AM MRN: [id number] Endoscopist: Mallie Mussel L. Loletha Carrow , MD Age: 63 Referring MD:  Date of Birth: September 24, 1955 Gender: Male Account #: [id number] Procedure:                Colonoscopy Indications:              Screening for colorectal malignant neoplasm (no                            polyps on last colonoscopy 2005) Medicines:                Monitored Anesthesia Care Procedure:                Pre-Anesthesia Assessment:                           - Prior to the procedure, a History and Physical                            was performed, and patient medications and                            allergies were reviewed. The patient's tolerance of                            previous anesthesia was also reviewed. The risks                            and benefits of the procedure and the sedation                            options and risks were discussed with the patient.                            All questions were answered, and informed consent                            was obtained. Prior Anticoagulants: The patient has                            taken no previous anticoagulant or antiplatelet                            agents. ASA Grade Assessment: II - A patient with                            mild systemic disease. After reviewing the risks                            and benefits, the patient was deemed in                            satisfactory condition to undergo the procedure.  After obtaining informed consent, the colonoscope                            was passed under direct vision. Throughout the                            procedure, the patient's blood pressure, pulse, and                            oxygen saturations were monitored continuously. The                            Colonoscope was introduced through the anus and                            advanced to the the cecum,  identified by                            appendiceal orifice and ileocecal valve. The                            colonoscopy was performed without difficulty. The                            patient tolerated the procedure well. The quality                            of the bowel preparation was good. The ileocecal                            valve, appendiceal orifice, and rectum were                            photographed. The bowel preparation used was SUPREP                            via split dose instruction. Scope In: 11:35:08 AM Scope Out: 12:04:01 PM Scope Withdrawal Time: 0 hours 26 minutes 36 seconds  Total Procedure Duration: 0 hours 28 minutes 53 seconds  Findings:                 The perianal and digital rectal examinations were                            normal.                           A diminutive polyp was found in the cecum. The                            polyp was sessile. The polyp was removed with a                            cold snare. Resection and retrieval were complete.  Three semi-sessile polyps were found in the                            proximal transverse colon. The polyps were 6 to 8                            mm in size. These polyps were removed with a cold                            snare. Resection and retrieval were complete.                           A 15 mm polyp was found in the proximal rectum                            (about 18cm from anal verge). The polyp was                            pedunculated. The polyp was removed with a hot                            snare. Resection and retrieval were complete. Area                            was tattooed with an injection of 1 mL of Spot                            (carbon black) - two 0.50ml injections just distal                            to the polypectomy site.                           Diverticula were found in the sigmoid colon.                           The exam was  otherwise without abnormality on                            direct and retroflexion views. Complications:            No immediate complications. Estimated Blood Loss:     Estimated blood loss was minimal. Impression:               - One diminutive polyp in the cecum, removed with a                            cold snare. Resected and retrieved.                           - Three 6 to 8 mm polyps in the proximal transverse  colon, removed with a cold snare. Resected and                            retrieved.                           - One 15 mm polyp in the proximal rectum, removed                            with a hot snare. Resected and retrieved. Tattooed.                           - Diverticulosis in the sigmoid colon.                           - The examination was otherwise normal on direct                            and retroflexion views. Recommendation:           - Patient has a contact number available for                            emergencies. The signs and symptoms of potential                            delayed complications were discussed with the                            patient. Return to normal activities tomorrow.                            Written discharge instructions were provided to the                            patient.                           - Resume previous diet.                           - Continue present medications.                           - Await pathology results.                           - Repeat colonoscopy is recommended for                            surveillance. The colonoscopy date will be                            determined after pathology results from today's                            exam  become available for review. Myrissa Chipley L. Loletha Carrow, MD 12/25/2018 12:10:58 PM This report has been signed electronically.

## 2018-12-25 NOTE — Progress Notes (Signed)
Report given to PACU, vss 

## 2018-12-25 NOTE — Patient Instructions (Signed)
Handouts given:  Polyps, Diverticulosis Resume previous diet Continue current medications Await pathology results    YOU HAD AN ENDOSCOPIC PROCEDURE TODAY AT [facility]:   Refer to the procedure report that was given to you for any specific questions about what was found during the examination.  If the procedure report does not answer your questions, please call your gastroenterologist to clarify.  If you requested that your care partner not be given the details of your procedure findings, then the procedure report has been included in a sealed envelope for you to review at your convenience later.  YOU SHOULD EXPECT: Some feelings of bloating in the abdomen. Passage of more gas than usual.  Walking can help get rid of the air that was put into your GI tract during the procedure and reduce the bloating. If you had a lower endoscopy (such as a colonoscopy or flexible sigmoidoscopy) you may notice spotting of blood in your stool or on the toilet paper. If you underwent a bowel prep for your procedure, you may not have a normal bowel movement for a few days.  Please Note:  You might notice some irritation and congestion in your nose or some drainage.  This is from the oxygen used during your procedure.  There is no need for concern and it should clear up in a day or so.  SYMPTOMS TO REPORT IMMEDIATELY:   Following lower endoscopy (colonoscopy or flexible sigmoidoscopy):  Excessive amounts of blood in the stool  Significant tenderness or worsening of abdominal pains  Swelling of the abdomen that is new, acute  Fever of 100F or higher  For urgent or emergent issues, a gastroenterologist can be reached at any hour by calling [phone].   DIET:  We do recommend a small meal at first, but then you may proceed to your regular diet.  Drink plenty of fluids but you should avoid alcoholic beverages for 24 hours.  ACTIVITY:  You should plan to take it easy for the rest of today  and you should NOT DRIVE or use heavy machinery until tomorrow (because of the sedation medicines used during the test).    FOLLOW UP: Our staff will call the number listed on your records 48-72 hours following your procedure to check on you and address any questions or concerns that you may have regarding the information given to you following your procedure. If we do not reach you, we will leave a message.  We will attempt to reach you two times.  During this call, we will ask if you have developed any symptoms of COVID 19. If you develop any symptoms (ie: fever, flu-like symptoms, shortness of breath, cough etc.) before then, please call [phone].  If you test positive for Covid 19 in the 2 weeks post procedure, please call and report this information to [place].    If any biopsies were taken you will be contacted by phone or by letter within the next 1-3 weeks.  Please call us at [phone] if you have not heard about the biopsies in 3 weeks.    SIGNATURES/CONFIDENTIALITY: You and/or your care partner have signed paperwork which will be entered into your electronic medical record.  These signatures attest to the fact that that the information above on your After Visit Summary has been reviewed and is understood.  Full responsibility of the confidentiality of this discharge information lies with you and/or your care-partner.

## 2018-12-25 NOTE — Progress Notes (Signed)
Called to room to assist during endoscopic procedure.  Patient ID and intended procedure confirmed with present staff. Received instructions for my participation in the procedure from the performing physician.  

## 2018-12-25 NOTE — Progress Notes (Signed)
Pt's states no medical or surgical changes since previsit or office visit.  Temp-lc  V/s-cw

## 2018-12-28 ENCOUNTER — Other Ambulatory Visit: Payer: Self-pay | Admitting: Cardiology

## 2018-12-30 ENCOUNTER — Telehealth: Payer: Self-pay

## 2018-12-30 NOTE — Telephone Encounter (Signed)
2nd attempt to reach patient for post-procedure f/u call. No answer. Left message for him to please not hesitate to call us if he has any questions/concerns regarding his care.

## 2018-12-30 NOTE — Telephone Encounter (Signed)
Attempted to reach patient for post-procedure f/u call. No answer. Left message that we will make another attempt to reach him later today and for him to please not hesitate to call us if he has any questions/concerns regarding his care,

## 2019-01-06 ENCOUNTER — Encounter: Payer: Self-pay | Admitting: Gastroenterology

## 2019-01-07 ENCOUNTER — Other Ambulatory Visit: Payer: Self-pay | Admitting: Cardiology

## 2019-01-08 ENCOUNTER — Other Ambulatory Visit: Payer: Self-pay | Admitting: Cardiology

## 2019-01-09 ENCOUNTER — Other Ambulatory Visit: Payer: Self-pay | Admitting: Cardiology

## 2019-01-09 NOTE — Telephone Encounter (Signed)
New Message   *STAT* If patient is at the pharmacy, call can be transferred to refill team.   1. Which medications need to be refilled? (please list name of each medication and dose if known) RABEprazole (ACIPHEX) 20 MG tablet  2. Which pharmacy/location (including street and city if local pharmacy) is medication to be sent to? [id number] - [place], [place] - [address] [place] [address]  3. Do they need a 30 day or 90 day supply? 90   Patient is out of medication and patient needs it today. Please call patient back once medication is called in.

## 2019-01-10 MED ORDER — RABEPRAZOLE SODIUM 20 MG PO TBEC: 20.0000 mg | DELAYED_RELEASE_TABLET | Freq: Every day | ORAL | 0 refills | Status: DC

## 2019-01-14 ENCOUNTER — Ambulatory Visit: Payer: BLUE CROSS/BLUE SHIELD | Admitting: Cardiology

## 2019-01-16 ENCOUNTER — Ambulatory Visit: Payer: BC Managed Care – PPO | Admitting: Cardiology

## 2019-01-16 ENCOUNTER — Encounter: Payer: Self-pay | Admitting: Cardiology

## 2019-01-16 ENCOUNTER — Other Ambulatory Visit: Payer: Self-pay

## 2019-01-16 VITALS — BP 126/87 | HR 76 | Temp 97.3°F | Ht 67.0 in | Wt 206.4 lb

## 2019-01-16 DIAGNOSIS — E785 Hyperlipidemia, unspecified: Secondary | ICD-10-CM

## 2019-01-16 DIAGNOSIS — I2510 Atherosclerotic heart disease of native coronary artery without angina pectoris: Secondary | ICD-10-CM | POA: Diagnosis not present

## 2019-01-16 DIAGNOSIS — N5201 Erectile dysfunction due to arterial insufficiency: Secondary | ICD-10-CM

## 2019-01-16 DIAGNOSIS — I10 Essential (primary) hypertension: Secondary | ICD-10-CM | POA: Diagnosis not present

## 2019-01-16 DIAGNOSIS — Z951 Presence of aortocoronary bypass graft: Secondary | ICD-10-CM | POA: Diagnosis not present

## 2019-01-16 NOTE — Patient Instructions (Signed)
Medication Instructions:  No changes *If you need a refill on your cardiac medications before your next appointment, please call your pharmacy*  Lab Work: Not needed  Testing/Procedures: Not needed  Follow-Up: At [facility], you and your health needs are our priority.  As part of our continuing mission to provide you with exceptional heart care, we have created designated Provider Care Teams.  These Care Teams include your primary Cardiologist (physician) and Advanced Practice Providers (APPs -  Physician Assistants and Nurse Practitioners) who all work together to provide you with the care you need, when you need it.  Your next appointment:   12 month(s) [id number]  The format for your next appointment:   In Person  Provider:   Glenetta Hew, MD  Other Instructions

## 2019-01-16 NOTE — Progress Notes (Signed)
Primary Care Provider: Brunetta Jeans, PA-C Cardiologist: Glenetta Hew, MD Electrophysiologist:   Clinic Note: Chief Complaint  Patient presents with  . Follow-up    annual  . Coronary Artery Disease   HPI:    [name] Benitez is a 64 y.o. male with a PMH below who presents today for annual f/u of CAD.   [name] Benitez was last seen on Jan 11, 2018  - doing well.  Ordered Myoview for CDL,  Recent Hospitalizations: None  Reviewed  CV studies:    The following studies were reviewed today: (if available, images/films reviewed: From Epic Chart or Care Everywhere) . Myoview 05/2018: EF 45%.  LOW RISK. NO ischemia or infarct.   Interval History:   [name] Benitez returns here today doing well from a cardiac standpoint.  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  He probably will need another 1 until he retires.  He is planning on retiring when he is 21.  At that point, he is probably considering moving to the beach either [place] or [place].  He really has done well from a cardiac standpoint.  He is bothered and limited by arthritis pains and is been somewhat sedentary during the COVID-19 pandemic timeframe.  He does try to do some type of exercise at least twice a week.  He denies any active cardiac symptoms of angina or heart failure.  CV Review of Symptoms (Summary): no chest pain or dyspnea on exertion negative for - edema, irregular heartbeat, orthopnea, palpitations, paroxysmal nocturnal dyspnea, rapid heart rate, shortness of breath or syncope / near syncope, TIA/amaurosis fugax; claudication   Currently smokes about ~1-2 cigarettes  / week off & on for stress release.   The patient does not have symptoms concerning for COVID-19 infection (fever, chills, cough, or new shortness of breath).  The patient is practicing social distancing &  Masking.   REVIEWED OF SYSTEMS   A comprehensive ROS was performed. Review of Systems  Constitutional: Negative for malaise/fatigue and weight loss.  HENT: Negative for nosebleeds.   Eyes: Positive for blurred vision (R eye vision getting worse). Negative for redness (dry & irritable).  Cardiovascular: Negative for leg swelling.  Gastrointestinal: Negative for blood in stool and melena.  Genitourinary: Negative for hematuria.  Musculoskeletal: Positive for joint pain (L elbow & wrist OA). Negative for falls.  Psychiatric/Behavioral: Negative for memory loss. The patient is not nervous/anxious and does not have insomnia.   All other systems reviewed and are negative.  I have reviewed and (if needed) personally updated the patient's problem list, medications, allergies, past medical and surgical history, social and family history.   PAST MEDICAL HISTORY   Past Medical History:  Diagnosis Date  . CAD in native artery    Essentially LAD occlusion with in-stent restenosis/thrombosis.  50% PDA--referred for CABG x2.  . Coronary stent restenosis due to progression of disease 04/2009   Subtotal occlusion of the LAD with ISR, unable to cross with wire; aneurysmal dilation of the LAD prior to stent  . Current occasional smoker    He initially quit around the time of his CABG, but he has gotten back to smoking maybe 1 or 2 a day as a stress relief.  Does not smoke every day.  Marland Kitchen Dyslipidemia, goal LDL below 70   . Erectile dysfunction   . History of GI  bleed    No recurrence  . History of vertigo   . Hypertension   . 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 ST elevation (STEMI) myocardial infarction involving left anterior descending coronary artery [facility]) October 2005   LAD PCI with 2 overlapping Cypher DES 3.0 mm 23 mm    PAST SURGICAL HISTORY   Past Surgical History:  Procedure Laterality Date  .  CARDIAC CATHETERIZATION  04/28/2009   Subtotal occlusion of mid LAD with ISR/thrombosis, unable to pass --> urgent CABG (Dr. Pecola Lawless)  . COLONOSCOPY  2005  . CORONARY ANGIOPLASTY WITH STENT PLACEMENT  10/26/2003   anterior ischemia with significant reversibility (mid anterior and anteroseptal, apical anterior and apical anteroseptal)  - Cypher 3x81mm stent to mid LAD and Cypher 3x13mm Cypher DES to prox LAD(Dr. Gerrie Nordmann)  . CORONARY ARTERY BYPASS GRAFT  04/2009   LIMA-LAD, reverse SVG-distal RCA (Dr. Chauncey Cruel. [place])  . DOPPLER ECHOCARDIOGRAPHY  04/2009   EF 50-50%, borderline septal hypokinesis  . Exercise Tolerance Test  June 20/15   Exercise 8:05 Min, 10.1 METs, reached 99% max. Peak HR -- 160 bpm; NEGATIVE Bruce TM GXT = no EKG evidence of ischemia. Prolonged heart rate and blood pressure recovery and PVCs noted that resolve with Exerction.  Marland Kitchen NM MYOVIEW LTD  05/2018   EF 45%.  Low risk.  No ischemia or infarction.    MEDICATIONS/ALLERGIES   Current Meds  Medication Sig  . aspirin EC 81 MG tablet Take 81 mg by mouth daily.  . benazepril (LOTENSIN) 20 MG tablet TAKE 1 TABLET BY MOUTH EVERY DAY  . fish oil-omega-3 fatty acids 1000 MG capsule Take 1 g by mouth 2 (two) times daily.   . metoprolol succinate (TOPROL-XL) 50 MG 24 hr tablet TAKE 1 TABLET BY MOUTH EVERY DAY WITH OR IMMEDIATELY FOLLOWING A MEAL  . niacin (NIASPAN) 1000 MG CR tablet Take 1 tablet (1,000 mg total) by mouth at bedtime. NEED OV FOR FUTURE REFILL  . RABEprazole (ACIPHEX) 20 MG tablet Take 1 tablet (20 mg total) by mouth daily. KEEP OV.  . sildenafil (VIAGRA) 100 MG tablet TAKE 1 TABLET BY MOUTH EVERY DAY AS NEEDED  . simvastatin (ZOCOR) 40 MG tablet TAKE 1 TABLET BY MOUTH EVERY DAY    No Known Allergies   SOCIAL HISTORY/FAMILY HISTORY   Social History   Tobacco Use  . Smoking status: Current Some Day Smoker    Types: Cigarettes    Last attempt to quit: 10/26/2003    Years since quitting: 15.2  .  Smokeless tobacco: Never Used  Substance Use Topics  . Alcohol use: Yes    Comment: occas  . Drug use: No   Social History   Social History Narrative   Divorced father of one, grandfather 2. No longer smokes -- quit in 2005. Works for [employer] in [place]. Does not get routine activity/exercise -- Basically, he is not motivated to do any exercise. He, by himself, is admittedly lazy. Does not drink alcohol.    Family History family history includes Cancer in his maternal grandmother and paternal grandfather; Hyperlipidemia in his father.   OBJCTIVE -PE, EKG, labs   Wt Readings from Last 3 Encounters:  01/16/19 206 lb 6.4 oz (93.6 kg)  12/25/18 210 lb (95.3 kg)  12/11/18 210 lb (95.3 kg)    Physical Exam: BP 126/87   Pulse 76  Temp (!) 97.3 F (36.3 C)   Ht 5\' 7"  (1.702 m)   Wt 206 lb 6.4 oz (93.6 kg)   SpO2 97%   BMI 32.33 kg/m  Physical Exam  Constitutional: He is oriented to person, place, and time. He appears well-developed and well-nourished. No distress.  Mildly obese.  Well-groomed  HENT:  Head: Normocephalic and atraumatic.  Neck: No hepatojugular reflux and no JVD present. Carotid bruit is not present. No thyromegaly present.  Cardiovascular: Normal rate, regular rhythm, normal heart sounds and intact distal pulses.  No extrasystoles are present. PMI is not displaced. Exam reveals no gallop and no friction rub.  No murmur heard. Pulmonary/Chest: Effort normal and breath sounds normal. No respiratory distress. He has no wheezes. He has no rales.  Abdominal: Soft. Bowel sounds are normal. He exhibits no distension. There is no abdominal tenderness. There is no rebound.  Musculoskeletal:        General: No edema. Normal range of motion.     Cervical back: Normal range of motion and neck supple.  Neurological: He is alert and oriented to person, place, and time.  Psychiatric: He has a normal mood and affect. His behavior is normal. Judgment and  thought content normal.  Vitals reviewed.   Adult ECG Report  Rate: 76;  Rhythm: normal sinus rhythm and Incomplete right bundle branch block but otherwise normal axis and intervals durations.;   Narrative Interpretation: Stable EKG  Recent Labs:    Lab Results  Component Value Date   CHOL 122 11/14/2018   HDL 40.10 11/14/2018   LDLCALC 55 11/14/2018   LDLDIRECT 60.0 11/06/2017   TRIG 133.0 11/14/2018   CHOLHDL 3 11/14/2018   Lab Results  Component Value Date   CREATININE 1.15 11/14/2018   BUN 13 11/14/2018   NA 137 11/14/2018   K 4.6 11/14/2018   CL 106 11/14/2018   CO2 23 11/14/2018    ASSESSMENT/PLAN    Problem List Items Addressed This Visit    Atherosclerosis of native coronary artery without angina pectoris (Chronic)    No recurrent angina since his CABG.  Most recent stress test was low risk.  EF was little bit down, but there is no regional wall motion normality.  Nothing to really explain it.  He opted to hold off on an echocardiogram.  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 He is on ACE inhibitor, beta-blocker and statin.  He is not currently taking fenofibrate.  Aspirin      CAD  - S/P urgent CABG x 2 for subtotal occlusion of the LAD in a stented segment that was unable to be recanalized percutaneously - Primary (Chronic)    Referred for CABG because of LAD occlusion.  Most recent Myoview was low risk with no ischemia or infarction.  Relatively preserved EF with no heart failure or angina symptoms.  Would probably not be due for screening stress test now until 2024.      Relevant Orders   EKG 12-Lead (Completed)   Essential hypertension (Chronic)    Blood pressure looks pretty well controlled on beta-blocker and ACE inhibitor.  No change.      Relevant Orders   EKG 12-Lead (Completed)   Dyslipidemia, at goal LDL below 70 (Chronic)    Last labs showed pretty well-controlled LDL.  Triglycerides were also pretty well  controlled.  It looks like they have stopped his fenofibrate.  Continue statin.  Labs are followed by PCP on an annual basis.  Erectile dysfunction (Chronic)    Did better with tradename Viagra than with sildenafil.  Provide refills.         COVID-19 Education: The signs and symptoms of COVID-19 were discussed with the patient and how to seek care for testing (follow up with PCP or arrange E-visit).   The importance of social distancing was discussed today.  I spent a total of 21 minutes with the patient and chart review. >  50% of the time was spent in direct patient consultation.  Additional time spent with chart review (studies, outside notes, etc): 6 Total Time: 27 min   Current medicines are reviewed at length with the patient today.  (+/- concerns) none   Patient Instructions / Medication Changes & Studies & Tests Ordered   Patient Instructions  Medication Instructions:  No changes *If you need a refill on your cardiac medications before your next appointment, please call your pharmacy*  Lab Work: Not needed  Testing/Procedures: Not needed  Follow-Up: At [facility], you and your health needs are our priority.  As part of our continuing mission to provide you with exceptional heart care, we have created designated Provider Care Teams.  These Care Teams include your primary Cardiologist (physician) and Advanced Practice Providers (APPs -  Physician Assistants and Nurse Practitioners) who all work together to provide you with the care you need, when you need it.  Your next appointment:   12 month(s) [id number]  The format for your next appointment:   In Person  Provider:   Glenetta Hew, MD  Other Instructions     Studies Ordered:   Orders Placed This Encounter  Procedures  . EKG 12-Lead     Glenetta Hew, M.D., M.S. Interventional Cardiologist   Pager # [phone] Phone # [phone] [address] [place], [place]  [address]   Thank you for choosing Heart[facility] at [facility]!!

## 2019-01-19 ENCOUNTER — Encounter: Payer: Self-pay | Admitting: Cardiology

## 2019-01-19 NOTE — Assessment & Plan Note (Addendum)
No recurrent angina since his CABG.  Most recent stress test was low risk.  EF was little bit down, but there is no regional wall motion normality.  Nothing to really explain it.  He opted to hold off on an echocardiogram.  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 He is on ACE inhibitor, beta-blocker and statin.  He is not currently taking fenofibrate.  Aspirin

## 2019-01-19 NOTE — Assessment & Plan Note (Signed)
Did better with tradename Viagra than with sildenafil.  Provide refills.

## 2019-01-19 NOTE — Assessment & Plan Note (Signed)
Referred for CABG because of LAD occlusion.  Most recent Myoview was low risk with no ischemia or infarction.  Relatively preserved EF with no heart failure or angina symptoms.  Would probably not be due for screening stress test now until 2024.

## 2019-01-19 NOTE — Assessment & Plan Note (Signed)
Blood pressure looks pretty well controlled on beta-blocker and ACE inhibitor.  No change.

## 2019-01-19 NOTE — Assessment & Plan Note (Signed)
Last labs showed pretty well-controlled LDL.  Triglycerides were also pretty well controlled.  It looks like they have stopped his fenofibrate.  Continue statin.  Labs are followed by PCP on an annual basis.

## 2019-04-06 ENCOUNTER — Other Ambulatory Visit: Payer: Self-pay | Admitting: Cardiology

## 2019-05-28 ENCOUNTER — Other Ambulatory Visit: Payer: Self-pay | Admitting: Cardiology

## 2019-07-03 ENCOUNTER — Other Ambulatory Visit: Payer: Self-pay | Admitting: Cardiology

## 2019-07-20 ENCOUNTER — Other Ambulatory Visit: Payer: Self-pay | Admitting: Cardiology

## 2019-08-26 ENCOUNTER — Other Ambulatory Visit: Payer: Self-pay | Admitting: Cardiology

## 2019-11-17 ENCOUNTER — Encounter: Payer: Self-pay | Admitting: Physician Assistant

## 2019-11-17 ENCOUNTER — Ambulatory Visit (INDEPENDENT_AMBULATORY_CARE_PROVIDER_SITE_OTHER): Payer: BC Managed Care – PPO | Admitting: Physician Assistant

## 2019-11-17 ENCOUNTER — Other Ambulatory Visit: Payer: Self-pay

## 2019-11-17 VITALS — BP 138/80 | HR 91 | Temp 98.3°F | Resp 16 | Ht 67.0 in | Wt 206.0 lb

## 2019-11-17 DIAGNOSIS — I10 Essential (primary) hypertension: Secondary | ICD-10-CM

## 2019-11-17 DIAGNOSIS — Z23 Encounter for immunization: Secondary | ICD-10-CM | POA: Diagnosis not present

## 2019-11-17 DIAGNOSIS — Z0001 Encounter for general adult medical examination with abnormal findings: Secondary | ICD-10-CM

## 2019-11-17 DIAGNOSIS — Z0000 Encounter for general adult medical examination without abnormal findings: Secondary | ICD-10-CM

## 2019-11-17 DIAGNOSIS — M25511 Pain in right shoulder: Secondary | ICD-10-CM | POA: Diagnosis not present

## 2019-11-17 DIAGNOSIS — Z125 Encounter for screening for malignant neoplasm of prostate: Secondary | ICD-10-CM | POA: Diagnosis not present

## 2019-11-17 DIAGNOSIS — E785 Hyperlipidemia, unspecified: Secondary | ICD-10-CM | POA: Diagnosis not present

## 2019-11-17 DIAGNOSIS — G8929 Other chronic pain: Secondary | ICD-10-CM

## 2019-11-17 LAB — CBC WITH DIFFERENTIAL/PLATELET
Basophils Absolute: 0 10*3/uL (ref 0.0–0.1)
Basophils Relative: 0.4 % (ref 0.0–3.0)
Eosinophils Absolute: 0.2 10*3/uL (ref 0.0–0.7)
Eosinophils Relative: 2.7 % (ref 0.0–5.0)
HCT: 51 % (ref 39.0–52.0)
Hemoglobin: 16.8 g/dL (ref 13.0–17.0)
Lymphocytes Relative: 30.3 % (ref 12.0–46.0)
Lymphs Abs: 2 10*3/uL (ref 0.7–4.0)
MCHC: 33.1 g/dL (ref 30.0–36.0)
MCV: 86.1 fl (ref 78.0–100.0)
Monocytes Absolute: 0.6 10*3/uL (ref 0.1–1.0)
Monocytes Relative: 9.1 % (ref 3.0–12.0)
Neutro Abs: 3.7 10*3/uL (ref 1.4–7.7)
Neutrophils Relative %: 57.5 % (ref 43.0–77.0)
Platelets: 270 10*3/uL (ref 150.0–400.0)
RBC: 5.92 Mil/uL — ABNORMAL HIGH (ref 4.22–5.81)
RDW: 14.1 % (ref 11.5–15.5)
WBC: 6.5 10*3/uL (ref 4.0–10.5)

## 2019-11-17 LAB — LIPID PANEL
Cholesterol: 104 mg/dL (ref 0–200)
HDL: 31.8 mg/dL — ABNORMAL LOW (ref >39.00)
LDL Cholesterol: 48 mg/dL (ref 0–99)
NonHDL: 72.36
Total CHOL/HDL Ratio: 3
Triglycerides: 121 mg/dL (ref 0.0–149.0)
VLDL: 24.2 mg/dL (ref 0.0–40.0)

## 2019-11-17 LAB — COMPREHENSIVE METABOLIC PANEL
ALT: 30 U/L (ref 0–53)
AST: 21 U/L (ref 0–37)
Albumin: 4.2 g/dL (ref 3.5–5.2)
Alkaline Phosphatase: 69 U/L (ref 39–117)
BUN: 19 mg/dL (ref 6–23)
CO2: 24 mEq/L (ref 19–32)
Calcium: 9.6 mg/dL (ref 8.4–10.5)
Chloride: 107 mEq/L (ref 96–112)
Creatinine, Ser: 0.99 mg/dL (ref 0.40–1.50)
GFR: 80.3 mL/min (ref >60.00)
Glucose, Bld: 108 mg/dL — ABNORMAL HIGH (ref 70–99)
Potassium: 4.5 mEq/L (ref 3.5–5.1)
Sodium: 140 mEq/L (ref 135–145)
Total Bilirubin: 0.7 mg/dL (ref 0.2–1.2)
Total Protein: 6.9 g/dL (ref 6.0–8.3)

## 2019-11-17 LAB — PSA: PSA: 1.22 ng/mL (ref 0.10–4.00)

## 2019-11-17 LAB — HEMOGLOBIN A1C: Hgb A1c MFr Bld: 6.3 % (ref 4.6–6.5)

## 2019-11-17 MED ORDER — MELOXICAM 15 MG PO TABS: 15.0000 mg | ORAL_TABLET | Freq: Every day | ORAL | 0 refills | Status: DC

## 2019-11-17 NOTE — Patient Instructions (Signed)
Please go to the lab for blood work.   Our office will call you with your results unless you have chosen to receive results via MyChart.  If your blood work is normal we will follow-up each year for physicals and as scheduled for chronic medical problems.  If anything is abnormal we will treat accordingly and get you in for a follow-up.  Please take the meloxicam once daily with food. Avoid heavy lifting or overhead lifting. You will be contacted for further evaluation and management by our Sports medicine providers.  Hang in there!   Preventive Care 40-59 Years Old, Male Preventive care refers to lifestyle choices and visits with your health care provider that can promote health and wellness. This includes:  A yearly physical exam. This is also called an annual well check.  Regular dental and eye exams.  Immunizations.  Screening for certain conditions.  Healthy lifestyle choices, such as eating a healthy diet, getting regular exercise, not using drugs or products that contain nicotine and tobacco, and limiting alcohol use. What can I expect for my preventive care visit? Physical exam Your health care provider will check:  Height and weight. These may be used to calculate body mass index (BMI), which is a measurement that tells if you are at a healthy weight.  Heart rate and blood pressure.  Your skin for abnormal spots. Counseling Your health care provider may ask you questions about:  Alcohol, tobacco, and drug use.  Emotional well-being.  Home and relationship well-being.  Sexual activity.  Eating habits.  Work and work Statistician. What immunizations do I need?  Influenza (flu) vaccine  This is recommended every year. Tetanus, diphtheria, and pertussis (Tdap) vaccine  You may need a Td booster every 10 years. Varicella (chickenpox) vaccine  You may need this vaccine if you have not already been vaccinated. Zoster (shingles) vaccine  You may need this  after age 95. Measles, mumps, and rubella (MMR) vaccine  You may need at least one dose of MMR if you were born in 1957 or later. You may also need a second dose. Pneumococcal conjugate (PCV13) vaccine  You may need this if you have certain conditions and were not previously vaccinated. Pneumococcal polysaccharide (PPSV23) vaccine  You may need one or two doses if you smoke cigarettes or if you have certain conditions. Meningococcal conjugate (MenACWY) vaccine  You may need this if you have certain conditions. Hepatitis A vaccine  You may need this if you have certain conditions or if you travel or work in places where you may be exposed to hepatitis A. Hepatitis B vaccine  You may need this if you have certain conditions or if you travel or work in places where you may be exposed to hepatitis B. Haemophilus influenzae type b (Hib) vaccine  You may need this if you have certain risk factors. Human papillomavirus (HPV) vaccine  If recommended by your health care provider, you may need three doses over 6 months. You may receive vaccines as individual doses or as more than one vaccine together in one shot (combination vaccines). Talk with your health care provider about the risks and benefits of combination vaccines. What tests do I need? Blood tests  Lipid and cholesterol levels. These may be checked every 5 years, or more frequently if you are over 35 years old.  Hepatitis C test.  Hepatitis B test. Screening  Lung cancer screening. You may have this screening every year starting at age 75 if you have a  30-pack-year history of smoking and currently smoke or have quit within the past 15 years.  Prostate cancer screening. Recommendations will vary depending on your family history and other risks.  Colorectal cancer screening. All adults should have this screening starting at age 48 and continuing until age 81. Your health care provider may recommend screening at age 82 if you are  at increased risk. You will have tests every 1-10 years, depending on your results and the type of screening test.  Diabetes screening. This is done by checking your blood sugar (glucose) after you have not eaten for a while (fasting). You may have this done every 1-3 years.  Sexually transmitted disease (STD) testing. Follow these instructions at home: Eating and drinking  Eat a diet that includes fresh fruits and vegetables, whole grains, lean protein, and low-fat dairy products.  Take vitamin and mineral supplements as recommended by your health care provider.  Do not drink alcohol if your health care provider tells you not to drink.  If you drink alcohol: ? Limit how much you have to 0-2 drinks a day. ? Be aware of how much alcohol is in your drink. In the [place] one drink equals one 12 oz bottle of beer (355 mL), one 5 oz glass of wine (148 mL), or one 1 oz glass of hard liquor (44 mL). Lifestyle  Take daily care of your teeth and gums.  Stay active. Exercise for at least 30 minutes on 5 or more days each week.  Do not use any products that contain nicotine or tobacco, such as cigarettes, e-cigarettes, and chewing tobacco. If you need help quitting, ask your health care provider.  If you are sexually active, practice safe sex. Use a condom or other form of protection to prevent STIs (sexually transmitted infections).  Talk with your health care provider about taking a low-dose aspirin every day starting at age 22. What's next?  Go to your health care provider once a year for a well check visit.  Ask your health care provider how often you should have your eyes and teeth checked.  Stay up to date on all vaccines. This information is not intended to replace advice given to you by your health care provider. Make sure you discuss any questions you have with your health care provider. Document Revised: 12/13/2017 Document Reviewed: 12/13/2017 Elsevier Patient Education  [employer]. .

## 2019-11-17 NOTE — Progress Notes (Signed)
Patient presents to clinic today for annual exam.  Patient is fasting for labs.  Acute Concerns: Patient endorses several months of right shoulder pain, worse with lying on affected side at night or with certain range of motion.  Notes pain will wake him from sleep on occasion if he is laying wrong on his arm.  Notes occasional tingling in his hands but none recently.  Denies trauma or injury.  Denies bruising of skin.  Takes ibuprofen with some relief of symptoms..   Chronic Issues: CAD/HTN/HLD --patient followed by cardiology.  Is currently on a regimen of benazepril 20 mg daily, Toprol-XL 50 mg daily, simvastatin 40 mg daily and fenofibrate 145 mg daily.  Also takes OTC fish oils and 81 mg ASA daily. Patient denies chest pain, palpitations, lightheadedness, dizziness, vision changes or frequent headaches.  BP Readings from Last 3 Encounters:  11/17/19 138/80  01/16/19 126/87  12/25/18 104/73   Health Maintenance: Immunizations -- Flu shot up-to-date.  Tdap due.  Agrees to immunizations diet. Colon Cancer Screening -- up-to-date.   Past Medical History:  Diagnosis Date  . CAD in native artery    Essentially LAD occlusion with in-stent restenosis/thrombosis.  50% PDA--referred for CABG x2.  . Coronary stent restenosis due to progression of disease 04/2009   Subtotal occlusion of the LAD with ISR, unable to cross with wire; aneurysmal dilation of the LAD prior to stent  . Current occasional smoker    He initially quit around the time of his CABG, but he has gotten back to smoking maybe 1 or 2 a day as a stress relief.  Does not smoke every day.  Marland Kitchen Dyslipidemia, goal LDL below 70   . Erectile dysfunction   . History of GI bleed    No recurrence  . History of vertigo   . Hypertension   . 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 ST elevation (STEMI) myocardial infarction involving left anterior  descending coronary artery [facility]) October 2005   LAD PCI with 2 overlapping Cypher DES 3.0 mm 23 mm   Past Surgical History:  Procedure Laterality Date  . CARDIAC CATHETERIZATION  04/28/2009   Subtotal occlusion of mid LAD with ISR/thrombosis, unable to pass --> urgent CABG (Dr. Pecola Lawless)  . COLONOSCOPY  2005  . CORONARY ANGIOPLASTY WITH STENT PLACEMENT  10/26/2003   anterior ischemia with significant reversibility (mid anterior and anteroseptal, apical anterior and apical anteroseptal)  - Cypher 3x2mm stent to mid LAD and Cypher 3x49mm Cypher DES to prox LAD(Dr. Gerrie Nordmann)  . CORONARY ARTERY BYPASS GRAFT  04/2009   LIMA-LAD, reverse SVG-distal RCA (Dr. Chauncey Cruel. Hendrickson)  . DOPPLER ECHOCARDIOGRAPHY  04/2009   EF 50-50%, borderline septal hypokinesis  . Exercise Tolerance Test  June 20/15   Exercise 8:05 Min, 10.1 METs, reached 99% max. Peak HR -- 160 bpm; NEGATIVE Bruce TM GXT = no EKG evidence of ischemia. Prolonged heart rate and blood pressure recovery and PVCs noted that resolve with Exerction.  Marland Kitchen NM MYOVIEW LTD  05/2018   EF 45%.  Low risk.  No ischemia or infarction.    Current Outpatient Medications on File Prior to Visit  Medication Sig Dispense Refill  . aspirin EC 81 MG tablet Take 81 mg by mouth daily.    . benazepril (LOTENSIN) 20 MG tablet TAKE 1 TABLET BY MOUTH EVERY DAY 90 tablet 3  . fenofibrate (  TRICOR) 145 MG tablet TAKE 1 TABLET BY MOUTH EVERY DAY 90 tablet 0  . fish oil-omega-3 fatty acids 1000 MG capsule Take 1 g by mouth 2 (two) times daily.     . metoprolol succinate (TOPROL-XL) 50 MG 24 hr tablet TAKE 1 TABLET BY MOUTH EVERY DAY WITH OR IMMEDIATELY FOLLOWING A MEAL 90 tablet 1  . niacin (NIASPAN) 1000 MG CR tablet Take 1 tablet (1,000 mg total) by mouth at bedtime. NEED OV FOR FUTURE REFILL 90 tablet 3  . RABEprazole (ACIPHEX) 20 MG tablet Take 1 tablet (20 mg total) by mouth daily. 90 tablet 1  . sildenafil (VIAGRA) 100 MG tablet TAKE 1 TABLET BY MOUTH EVERY DAY AS  NEEDED 4 tablet 17  . simvastatin (ZOCOR) 40 MG tablet TAKE 1 TABLET BY MOUTH EVERY DAY 90 tablet 1   No current facility-administered medications on file prior to visit.    No Known Allergies  Family History  Problem Relation Age of Onset  . Hyperlipidemia Father   . Cancer Maternal Grandmother   . Cancer Paternal Grandfather   . Colon cancer Neg Hx   . Colon polyps Neg Hx   . Esophageal cancer Neg Hx   . Rectal cancer Neg Hx   . Stomach cancer Neg Hx     Social History   Socioeconomic History  . Marital status: Divorced    Spouse name: Not on file  . Number of children: 1  . Years of education: 69  . Highest education level: Not on file  Occupational History    Employer: [employer]  Tobacco Use  . Smoking status: Current Some Day Smoker    Types: Cigarettes    Last attempt to quit: 10/26/2003    Years since quitting: 16.0  . Smokeless tobacco: Never Used  Vaping Use  . Vaping Use: Never used  Substance and Sexual Activity  . Alcohol use: Yes    Comment: occas  . Drug use: No  . Sexual activity: Yes    Birth control/protection: Condom  Other Topics Concern  . Not on file  Social History Narrative   Divorced father of one, grandfather 2. No longer smokes -- quit in 2005. Works for [employer] in [place]. Does not get routine activity/exercise -- Basically, he is not motivated to do any exercise. He, by himself, is admittedly lazy. Does not drink alcohol.   Social Determinants of Health   Financial Resource Strain:   . Difficulty of Paying Living Expenses: Not on file  Food Insecurity:   . Worried About Charity fundraiser in the Last Year: Not on file  . Ran Out of Food in the Last Year: Not on file  Transportation Needs:   . Lack of Transportation (Medical): Not on file  . Lack of Transportation (Non-Medical): Not on file  Physical Activity:   . Days of Exercise per Week: Not on file  . Minutes of Exercise per Session: Not on  file  Stress:   . Feeling of Stress : Not on file  Social Connections:   . Frequency of Communication with Friends and Family: Not on file  . Frequency of Social Gatherings with Friends and Family: Not on file  . Attends Religious Services: Not on file  . Active Member of Clubs or Organizations: Not on file  . Attends Archivist Meetings: Not on file  . Marital Status: Not on file  Intimate Partner Violence:   . Fear of Current or  Ex-Partner: Not on file  . Emotionally Abused: Not on file  . Physically Abused: Not on file  . Sexually Abused: Not on file   Review of Systems  Constitutional: Negative for fever and weight loss.  HENT: Negative for ear discharge, ear pain, hearing loss and tinnitus.   Eyes: Negative for blurred vision, double vision, photophobia and pain.  Respiratory: Negative for cough and shortness of breath.   Cardiovascular: Negative for chest pain and palpitations.  Gastrointestinal: Negative for abdominal pain, blood in stool, constipation, diarrhea, heartburn, melena, nausea and vomiting.  Genitourinary: Negative for dysuria, flank pain, frequency, hematuria and urgency.  Musculoskeletal: Positive for joint pain (R shoulder -- > 2 months). Negative for falls.  Neurological: Negative for dizziness, loss of consciousness and headaches.  Endo/Heme/Allergies: Negative for environmental allergies.  Psychiatric/Behavioral: Negative for depression, hallucinations, substance abuse and suicidal ideas. The patient is not nervous/anxious and does not have insomnia.    BP 138/80   Pulse 91   Temp 98.3 F (36.8 C) (Temporal)   Resp 16   Ht 5\' 7"  (1.702 m)   Wt 206 lb (93.4 kg)   SpO2 97%   BMI 32.26 kg/m   Physical Exam Vitals reviewed.  Constitutional:      General: He is not in acute distress.    Appearance: He is well-developed. He is not diaphoretic.  HENT:     Head: Normocephalic and atraumatic.     Right Ear: Tympanic membrane, ear canal and  external ear normal.     Left Ear: Tympanic membrane, ear canal and external ear normal.     Nose: Nose normal.     Mouth/Throat:     Pharynx: No posterior oropharyngeal erythema.  Eyes:     Conjunctiva/sclera: Conjunctivae normal.     Pupils: Pupils are equal, round, and reactive to light.  Neck:     Thyroid: No thyromegaly.  Cardiovascular:     Rate and Rhythm: Normal rate and regular rhythm.     Heart sounds: Normal heart sounds.  Pulmonary:     Effort: Pulmonary effort is normal. No respiratory distress.     Breath sounds: Normal breath sounds. No wheezing or rales.  Chest:     Chest wall: No tenderness.  Abdominal:     General: Bowel sounds are normal. There is no distension.     Palpations: Abdomen is soft. There is no mass.     Tenderness: There is no abdominal tenderness. There is no guarding or rebound.  Musculoskeletal:     Right shoulder: Decreased range of motion (pain with ER,IR. Strength preserved. + empty can test).     Cervical back: Neck supple.  Lymphadenopathy:     Cervical: No cervical adenopathy.  Skin:    General: Skin is warm and dry.     Findings: No rash.  Neurological:     Mental Status: He is alert and oriented to person, place, and time.     Cranial Nerves: No cranial nerve deficit.    Assessment/Plan: 1. Visit for preventive health examination Depression screen negative. Health Maintenance reviewed. Preventive schedule discussed and handout given in AVS. Will obtain fasting labs today.  - CBC with Differential/Platelet - Comprehensive metabolic panel - Hemoglobin A1c - Lipid panel  2. Prostate cancer screening He indicates understanding of the limitations of this screening test and wishes to proceed with screening PSA testing.  - PSA  3. Need for Tdap vaccination Immunization given today - Tdap vaccine greater than or equal to  7yo IM  4. Essential hypertension BP stable.  Asymptomatic.  Continue management per specialist.  5.  Dyslipidemia, at goal LDL below 70 Continue management per specialist.  Repeat labs today. - Comprehensive metabolic panel - Lipid panel  6. Chronic right shoulder pain Concern for R rotator cuff tendinopathy. Avoid heavy lifting and overexertion.  Rx meloxicam to take once daily with food.  Tylenol for breakthrough pain.  OTC topical medications reviewed.  Referral to sports medicine placed for further evaluation and management. - Ambulatory referral to Sports Medicine - meloxicam (MOBIC) 15 MG tablet; Take 1 tablet (15 mg total) by mouth daily.  Dispense: 30 tablet; Refill: 0  This visit occurred during the SARS-CoV-2 public health emergency.  Safety protocols were in place, including screening questions prior to the visit, additional usage of staff PPE, and extensive cleaning of exam room while observing appropriate contact time as indicated for disinfecting solutions.    Leeanne Rio, PA-C

## 2019-12-10 ENCOUNTER — Other Ambulatory Visit: Payer: Self-pay | Admitting: Physician Assistant

## 2019-12-10 DIAGNOSIS — G8929 Other chronic pain: Secondary | ICD-10-CM

## 2019-12-10 DIAGNOSIS — M25511 Pain in right shoulder: Secondary | ICD-10-CM

## 2019-12-10 NOTE — Telephone Encounter (Signed)
LFD 11/17/19#30 with no refills LOV 11/17/19 NOV none

## 2019-12-13 ENCOUNTER — Other Ambulatory Visit: Payer: Self-pay | Admitting: Cardiology

## 2020-01-15 ENCOUNTER — Telehealth: Payer: Self-pay | Admitting: Cardiology

## 2020-01-15 NOTE — Telephone Encounter (Signed)
Pt advised to contact PCP for recommendations. Pt verbalized understanding.

## 2020-01-15 NOTE — Telephone Encounter (Signed)
Patient states he tested positive for COVID on 01/14/20. He reports he has had a headache and a fever.  He rescheduled his appointment, previously scheduled for 01/20/20 with Dr. Ellyn Hack. The new appointment date is 02/05/20 at 3:20 PM.  For the time being, he would like to know if Dr. Ellyn Hack has any medication recommendations for medications that assist with a fever. Please advise/

## 2020-01-20 ENCOUNTER — Ambulatory Visit: Payer: BC Managed Care – PPO | Admitting: Cardiology

## 2020-02-05 ENCOUNTER — Encounter: Payer: Self-pay | Admitting: Cardiology

## 2020-02-05 ENCOUNTER — Other Ambulatory Visit: Payer: Self-pay

## 2020-02-05 ENCOUNTER — Ambulatory Visit (INDEPENDENT_AMBULATORY_CARE_PROVIDER_SITE_OTHER): Payer: Medicare HMO | Admitting: Cardiology

## 2020-02-05 VITALS — BP 110/80 | HR 85 | Ht 66.0 in | Wt 203.6 lb

## 2020-02-05 DIAGNOSIS — N5201 Erectile dysfunction due to arterial insufficiency: Secondary | ICD-10-CM | POA: Diagnosis not present

## 2020-02-05 DIAGNOSIS — I10 Essential (primary) hypertension: Secondary | ICD-10-CM | POA: Diagnosis not present

## 2020-02-05 DIAGNOSIS — E785 Hyperlipidemia, unspecified: Secondary | ICD-10-CM

## 2020-02-05 DIAGNOSIS — I2510 Atherosclerotic heart disease of native coronary artery without angina pectoris: Secondary | ICD-10-CM

## 2020-02-05 DIAGNOSIS — Z951 Presence of aortocoronary bypass graft: Secondary | ICD-10-CM

## 2020-02-05 MED ORDER — SILDENAFIL CITRATE 100 MG PO TABS: 100.0000 mg | ORAL_TABLET | Freq: Every day | ORAL | 11 refills | Status: DC | PRN

## 2020-02-05 NOTE — Progress Notes (Signed)
Primary Care Provider: Brunetta Jeans, PA-C Cardiologist: Glenetta Hew, MD Electrophysiologist: None  Clinic Note: Chief Complaint  Patient presents with  . Follow-up    Follow-up Yearly  . Coronary Artery Disease    No angina.   ===================================  ASSESSMENT/PLAN   Problem List Items Addressed This Visit    Atherosclerosis of native coronary artery without angina pectoris - Primary (Chronic)    Really since his CABG, he has not had any further chest pain.  He is now just about 11 years out and doing well.  Last Myoview was in May 2020.  No ischemia or infarction.  EF is slightly down.  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  Plan:   Continue aspirin alone.  No bleeding  On combination of fish oil, fenofibrate and simvastatin first lipids.  He is also on niacin which has questionable benefit.  I think he can probably wean off of that.  He is on benazepril and Toprol at stable doses.  Tolerating well.        Relevant Medications   sildenafil (VIAGRA) 100 MG tablet   Other Relevant Orders   EKG 12-Lead (Completed)   CAD  - S/P urgent CABG x 2 for subtotal occlusion of the LAD in a stented segment that was unable to be recanalized percutaneously (Chronic)    Status post CABG x2 with LIMA to LAD and SVG to PDA after the LAD was not able to be reopened due to ISR.  No further angina.  Nonischemic Myoview last year.  We talked about further stress testing-would potentially be due in May 2024 but we can discuss at that time.  He may not be interested in testing unless he has symptoms.      Relevant Orders   EKG 12-Lead (Completed)   Lipid panel   Essential hypertension (Chronic)   Relevant Medications   sildenafil (VIAGRA) 100 MG tablet   Dyslipidemia, at goal LDL below 70 (Chronic)   Relevant Medications   sildenafil (VIAGRA) 100 MG tablet   Other Relevant Orders   Lipid panel    Erectile dysfunction (Chronic)    Doing pretty well on the Viagra.  We will refill for him.  He is not requiring any nitrate.        ===================================  HPI:    [name] Benitez is a 65 y.o. male with a PMH notable for CAD (CABG x2) with HTN and HLD on ED who presents today for annual follow-up.  [name] Benitez was last seen on 10/16/2019-doing well from our standpoint.  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  He does not do the DOT physical evaluation every year, now only requires stress test every 5 years.  [place] he reaches retirement age of 21.. => No cardiac symptoms.  Down to 1-2 cigarettes/week almost.  Recent Hospitalizations: None  Reviewed  CV studies:    The following studies were reviewed today: (if available, images/films reviewed: From Epic Chart or Care Everywhere) . None:   Interval History:   [name] Benitez returns here today for annual follow-up doing fine from cardiac standpoint.  He is still pretty active.  He is still traffic about what to do about his retirement.  He is got 1-1/2 more years before he can get full benefits.  But he is debating whether he continues to work a little longer in order to put away some more money.  He was having weird URI type symptoms with congestion and headache as well as sore throat and coughing.  Felt he had symptoms of viral syndrome and he went and got tested for Covid, but for what ever reason the birthday that is dado was recorded under was not correct and therefore he is not able to pull up his results.  He is presuming was negative, because nobody called him.  From a cardiac standpoint he is doing fine.  He says that he has run out of his sildenafil but that was helping his ED.  He is not having any anginal symptoms with rest or exertion.  No heart failure symptoms or arrhythmia  symptoms.  CV Review of Symptoms (Summary): no chest pain or dyspnea on exertion positive for - Controlled ED. negative for - edema, irregular heartbeat, orthopnea, palpitations, paroxysmal nocturnal dyspnea, rapid heart rate, shortness of breath or Dizziness/lightheadedness, syncope/near syncope or TIA/amaurosis fugax, claudication  The patient does not have symptoms concerning for COVID-19 infection (fever, chills, cough, or new shortness of breath).   REVIEWED OF SYSTEMS   Review of Systems  Constitutional: Positive for malaise/fatigue (He was a little bit fatigued and worn out when he was having this URI type symptoms.  But it is improving now.  Not really at baseline.). Negative for weight loss.  HENT: Positive for congestion and sinus pain.   Eyes: Positive for blurred vision (Is I is doing better now.  Was having issues with his right eye.).  Respiratory: Positive for cough. Negative for sputum production and wheezing.        He has been having sinus congestion with headache, and mild cough since January.  He has had COVID test done, but was not able to get onto the website to find his results.  Gastrointestinal: Negative for abdominal pain, blood in stool, diarrhea and melena.  Genitourinary: Negative for frequency and hematuria.  Musculoskeletal: Positive for joint pain (Still has some off-and-on elbow and knee pain). Negative for back pain.  Neurological: Negative for dizziness and focal weakness.  Psychiatric/Behavioral: Negative for depression and memory loss. The patient is not nervous/anxious and does not have insomnia.    I have reviewed and (if needed) personally updated the patient's problem list, medications, allergies, past medical and surgical history, social and family history.   PAST MEDICAL HISTORY   Past Medical History:  Diagnosis Date  . CAD in native artery    Essentially LAD occlusion with in-stent restenosis/thrombosis.  50% PDA--referred for CABG x2.  .  Coronary stent restenosis due to progression of disease 04/2009   Subtotal occlusion of the LAD with ISR, unable to cross with wire; aneurysmal dilation of the LAD prior to stent  . Current occasional smoker    He initially quit around the time of his CABG, but he has gotten back to smoking maybe 1 or 2 a day as a stress relief.  Does not smoke every day.  Marland Kitchen Dyslipidemia, goal LDL below 70   . Erectile dysfunction   . History of GI bleed    No recurrence  . History of vertigo   . Hypertension   . 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 ST elevation (STEMI) myocardial infarction involving left anterior descending coronary artery [facility]) October 2005  LAD PCI with 2 overlapping Cypher DES 3.0 mm 23 mm    PAST SURGICAL HISTORY   Past Surgical History:  Procedure Laterality Date  . CARDIAC CATHETERIZATION  04/28/2009   Subtotal occlusion of mid LAD with ISR/thrombosis, unable to pass --> urgent CABG (Dr. Pecola Lawless)  . COLONOSCOPY  2005  . CORONARY ANGIOPLASTY WITH STENT PLACEMENT  10/26/2003   anterior ischemia with significant reversibility (mid anterior and anteroseptal, apical anterior and apical anteroseptal)  - Cypher 3x43mm stent to mid LAD and Cypher 3x59mm Cypher DES to prox LAD(Dr. Gerrie Nordmann)  . CORONARY ARTERY BYPASS GRAFT  04/2009   LIMA-LAD, reverse SVG-distal RCA (Dr. Chauncey Cruel. Hendrickson)  . DOPPLER ECHOCARDIOGRAPHY  04/2009   EF 50-50%, borderline septal hypokinesis  . Exercise Tolerance Test  June 20/15   Exercise 8:05 Min, 10.1 METs, reached 99% max. Peak HR -- 160 bpm; NEGATIVE Bruce TM GXT = no EKG evidence of ischemia. Prolonged heart rate and blood pressure recovery and PVCs noted that resolve with Exerction.  Marland Kitchen NM MYOVIEW LTD  05/2018   EF 45%.  Low risk.  No ischemia or infarction.    Immunization History  Administered Date(s) Administered  . Influenza Inj Mdck Quad Pf 10/03/2017  .  Influenza,inj,Quad PF,6+ Mos 09/21/2018, 10/03/2019  . Moderna Sars-Covid-2 Vaccination 03/29/2019, 04/26/2019  . PFIZER(Purple Top)SARS-COV-2 Vaccination 11/07/2019  . Tdap 11/17/2019    MEDICATIONS/ALLERGIES   Current Meds  Medication Sig  . acetaminophen (TYLENOL) 500 MG tablet Take 500 mg by mouth every 6 (six) hours as needed.  Marland Kitchen aspirin EC 81 MG tablet Take 81 mg by mouth daily.  . benazepril (LOTENSIN) 20 MG tablet TAKE 1 TABLET BY MOUTH EVERY DAY  . fenofibrate (TRICOR) 145 MG tablet TAKE 1 TABLET BY MOUTH EVERY DAY  . fish oil-omega-3 fatty acids 1000 MG capsule Take 1 g by mouth 2 (two) times daily.   . meloxicam (MOBIC) 15 MG tablet Take 1 tablet (15 mg total) by mouth daily.  . metoprolol succinate (TOPROL-XL) 50 MG 24 hr tablet TAKE 1 TABLET BY MOUTH EVERY DAY WITH OR IMMEDIATELY FOLLOWING A MEAL  . RABEprazole (ACIPHEX) 20 MG tablet TAKE 1 TABLET BY MOUTH EVERY DAY  . simvastatin (ZOCOR) 40 MG tablet TAKE 1 TABLET BY MOUTH EVERY DAY  . [DISCONTINUED] niacin (NIASPAN) 1000 MG CR tablet TAKE 1 TABLET (1,000 MG TOTAL) BY MOUTH AT BEDTIME. NEED OV FOR FUTURE REFILL  . [DISCONTINUED] sildenafil (VIAGRA) 100 MG tablet TAKE 1 TABLET BY MOUTH EVERY DAY AS NEEDED    No Known Allergies  SOCIAL HISTORY/FAMILY HISTORY   Reviewed in Epic:  Pertinent findings:  Social History   Tobacco Use  . Smoking status: Current Some Day Smoker    Types: Cigarettes    Last attempt to quit: 10/26/2003    Years since quitting: 16.3  . Smokeless tobacco: Never Used  Vaping Use  . Vaping Use: Never used  Substance Use Topics  . Alcohol use: Yes    Comment: occas  . Drug use: No   Social History   Social History Narrative   Divorced father of one, grandfather 2. No longer smokes -- quit in 2005. Works for [employer] in [place]. Does not get routine activity/exercise -- Basically, he is not motivated to do any exercise. He, by himself, is admittedly lazy. Does not drink  alcohol.    OBJCTIVE -PE, EKG, labs   Wt Readings from Last 3 Encounters:  02/05/20 203 lb 9.6 oz (92.4 kg)  11/17/19 206 lb (93.4 kg)  01/16/19 206 lb 6.4 oz (93.6 kg)    Physical Exam: BP 110/80 (BP Location: Left Arm, Patient Position: Sitting)   Pulse 85   Ht 5\' 6"  (1.676 m)   Wt 203 lb 9.6 oz (92.4 kg)   SpO2 95%   BMI 32.86 kg/m  Physical Exam Vitals reviewed.  Constitutional:      General: He is not in acute distress.    Appearance: Normal appearance. He is obese. He is not ill-appearing or toxic-appearing.     Comments: Well-groomed.  Healthy-appearing.  HENT:     Head: Normocephalic and atraumatic.  Neck:     Vascular: No carotid bruit, hepatojugular reflux or JVD.  Cardiovascular:     Rate and Rhythm: Normal rate and regular rhythm.  No extrasystoles are present.    Chest Wall: PMI is not displaced.     Pulses: Normal pulses.     Heart sounds: Normal heart sounds. No murmur heard. No friction rub. No gallop.   Pulmonary:     Effort: Pulmonary effort is normal. No respiratory distress.     Breath sounds: Normal breath sounds.  Chest:     Chest wall: No tenderness.  Musculoskeletal:        General: No swelling. Normal range of motion.     Cervical back: Normal range of motion and neck supple.  Skin:    General: Skin is warm and dry.  Neurological:     General: No focal deficit present.     Mental Status: He is alert and oriented to person, place, and time.     Motor: No weakness.     Gait: Gait normal.  Psychiatric:        Mood and Affect: Mood normal.        Behavior: Behavior normal.        Thought Content: Thought content normal.        Judgment: Judgment normal.     Adult ECG Report  Rate: 85 ;  Rhythm: normal sinus rhythm and Normal axis, intervals and durations.;   Narrative Interpretation: Stable EKG.  Recent Labs: Reviewed Lab Results  Component Value Date   CHOL 104 11/17/2019   HDL 31.80 (L) 11/17/2019   LDLCALC 48 11/17/2019    LDLDIRECT 60.0 11/06/2017   TRIG 121.0 11/17/2019   CHOLHDL 3 11/17/2019   Lab Results  Component Value Date   CREATININE 0.99 11/17/2019   BUN 19 11/17/2019   NA 140 11/17/2019   K 4.5 11/17/2019   CL 107 11/17/2019   CO2 24 11/17/2019   CBC Latest Ref Rng & Units 11/17/2019 11/14/2018 11/06/2017  WBC 4.0 - 10.5 K/uL 6.5 6.1 5.4  Hemoglobin 13.0 - 17.0 g/dL 16.8 16.9 17.1(H)  Hematocrit 39.0 - 52.0 % 51.0 51.0 49.6  Platelets 150.0 - 400.0 K/uL 270.0 264.0 189.0    ==================================================  COVID-19 Education: The signs and symptoms of COVID-19 were discussed with the patient and how to seek care for testing (follow up with PCP or arrange E-visit).   The importance of social distancing and COVID-19 vaccination was discussed today. The patient is practicing social distancing & Masking.   I spent a total of 35minutes with the patient spent in direct patient consultation.  Additional time spent with chart review  / charting (studies, outside notes, etc): 10 min Total Time: 32 min   Current medicines are reviewed at length with the patient today.  (+/- concerns) n/a  This visit occurred during  the SARS-CoV-2 public health emergency.  Safety protocols were in place, including screening questions prior to the visit, additional usage of staff PPE, and extensive cleaning of exam room while observing appropriate contact time as indicated for disinfecting solutions.  Notice: This dictation was prepared with Dragon dictation along with smaller phrase technology. Any transcriptional errors that result from this process are unintentional and may not be corrected upon review.  Patient Instructions / Medication Changes & Studies & Tests Ordered   Patient Instructions  Medication Instructions:    AFTER CURRENT BOTTLE OF NIACIN STOP TAKING  *If you need a refill on your cardiac medications before your next appointment, please call your pharmacy*   Lab  Work: [place] 2022  If you have labs (blood work) drawn today and your tests are completely normal, you will receive your results only by: Marland Kitchen MyChart Message (if you have MyChart) OR . A paper copy in the mail If you have any lab test that is abnormal or we need to change your treatment, we will call you to review the results.   Testing/Procedures: NOT NEEDED  Follow-Up: At [facility], you and your health needs are our priority.  As part of our continuing mission to provide you with exceptional heart care, we have created designated Provider Care Teams.  These Care Teams include your primary Cardiologist (physician) and Advanced Practice Providers (APPs -  Physician Assistants and Nurse Practitioners) who all work together to provide you with the care you need, when you need it.     Your next appointment:   11 month(s)  The format for your next appointment:   In Person  Provider:   Glenetta Hew, MD        Studies Ordered:   Orders Placed This Encounter  Procedures  . Lipid panel  . EKG 12-Lead     Glenetta Hew, M.D., M.S. Interventional Cardiologist   Pager # [phone] Phone # [phone] [address] [place], [place] [address]   Thank you for choosing Heartcare at [facility]!!

## 2020-02-05 NOTE — Patient Instructions (Addendum)
Medication Instructions:    AFTER CURRENT BOTTLE OF NIACIN STOP TAKING  *If you need a refill on your cardiac medications before your next appointment, please call your pharmacy*   Lab Work: [place] 2022  If you have labs (blood work) drawn today and your tests are completely normal, you will receive your results only by: Marland Kitchen MyChart Message (if you have MyChart) OR . A paper copy in the mail If you have any lab test that is abnormal or we need to change your treatment, we will call you to review the results.   Testing/Procedures: NOT NEEDED  Follow-Up: At [facility], you and your health needs are our priority.  As part of our continuing mission to provide you with exceptional heart care, we have created designated Provider Care Teams.  These Care Teams include your primary Cardiologist (physician) and Advanced Practice Providers (APPs -  Physician Assistants and Nurse Practitioners) who all work together to provide you with the care you need, when you need it.     Your next appointment:   11 month(s)  The format for your next appointment:   In Person  Provider:   Glenetta Hew, MD

## 2020-02-14 ENCOUNTER — Encounter: Payer: Self-pay | Admitting: Cardiology

## 2020-02-14 NOTE — Assessment & Plan Note (Signed)
Doing pretty well on the Viagra.  We will refill for him.  He is not requiring any nitrate.

## 2020-02-14 NOTE — Assessment & Plan Note (Signed)
Status post CABG x2 with LIMA to LAD and SVG to PDA after the LAD was not able to be reopened due to ISR.  No further angina.  Nonischemic Myoview last year.  We talked about further stress testing-would potentially be due in May 2024 but we can discuss at that time.  He may not be interested in testing unless he has symptoms.

## 2020-02-14 NOTE — Assessment & Plan Note (Signed)
Really since his CABG, he has not had any further chest pain.  He is now just about 11 years out and doing well.  Last Myoview was in May 2020.  No ischemia or infarction.  EF is slightly down.  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  Plan:   Continue aspirin alone.  No bleeding  On combination of fish oil, fenofibrate and simvastatin first lipids.  He is also on niacin which has questionable benefit.  I think he can probably wean off of that.  He is on benazepril and Toprol at stable doses.  Tolerating well.

## 2020-02-20 ENCOUNTER — Encounter: Payer: Self-pay | Admitting: Gastroenterology

## 2020-04-14 ENCOUNTER — Other Ambulatory Visit: Payer: Self-pay

## 2020-04-14 MED ORDER — RABEPRAZOLE SODIUM 20 MG PO TBEC: 20.0000 mg | DELAYED_RELEASE_TABLET | Freq: Every day | ORAL | 1 refills | Status: DC

## 2020-04-15 ENCOUNTER — Other Ambulatory Visit: Payer: Self-pay

## 2020-04-15 MED ORDER — METOPROLOL SUCCINATE ER 50 MG PO TB24: ORAL_TABLET | ORAL | 3 refills | Status: DC

## 2020-04-15 MED ORDER — SIMVASTATIN 40 MG PO TABS: 40.0000 mg | ORAL_TABLET | Freq: Every day | ORAL | 3 refills | Status: DC

## 2020-04-22 ENCOUNTER — Other Ambulatory Visit: Payer: Self-pay

## 2020-04-22 MED ORDER — RABEPRAZOLE SODIUM 20 MG PO TBEC: 20.0000 mg | DELAYED_RELEASE_TABLET | Freq: Every day | ORAL | 3 refills | Status: DC

## 2020-04-27 ENCOUNTER — Other Ambulatory Visit: Payer: Self-pay

## 2020-04-27 ENCOUNTER — Telehealth: Payer: Self-pay | Admitting: Cardiology

## 2020-04-27 MED ORDER — METOPROLOL SUCCINATE ER 50 MG PO TB24: ORAL_TABLET | ORAL | 2 refills | Status: DC

## 2020-04-27 NOTE — Telephone Encounter (Signed)
*  STAT* If patient is at the pharmacy, call can be transferred to refill team.   1. Which medications need to be refilled? (please list name of each medication and dose if known) metoprolol succinate (TOPROL-XL) 50 MG 24 hr tablet   2. Which pharmacy/location (including street and city if local pharmacy) is medication to be sent to? [facility], please see below.   3. Do they need a 30 day or 90 day supply? 90 day supply   Pt is calling in regards to having his meds moving forward sent to:  [facility] [phone] [address] [place] [address]  Fax# [phone] Phone# [phone]  [email or web address]  Pt states that [facility] called him and told him this script is ready for pickup but pt does not know if he should go ahead and pick it up or wait for [facility] to deliver it.

## 2020-06-01 ENCOUNTER — Telehealth: Payer: Self-pay | Admitting: Cardiology

## 2020-06-01 NOTE — Telephone Encounter (Signed)
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

## 2020-06-01 NOTE — Telephone Encounter (Signed)
Spoke with pt, he is needing copy of last office note faxed to [place] [facility] in [place], fax number [phone]. Office note faxed to the number provided via epic.

## 2020-06-03 ENCOUNTER — Telehealth: Payer: Self-pay | Admitting: Cardiology

## 2020-06-03 NOTE — Telephone Encounter (Signed)
Spoke with [facility], aware the patients last nuclear testing was in 2020 and his EF=45%. Report faxed to the number provided.

## 2020-06-03 NOTE — Telephone Encounter (Signed)
    Nikki calling with [employer] and pt is there at her office, we sent the DOT clearance for pt but on the paper work there's no EF number it only says "slighty down" but they need an actual number, she gave phone# [phone] and fax#  [phone]. She asked if there's can be send today since pt is there with her now.

## 2020-07-12 DIAGNOSIS — E785 Hyperlipidemia, unspecified: Secondary | ICD-10-CM | POA: Diagnosis not present

## 2020-07-12 DIAGNOSIS — Z951 Presence of aortocoronary bypass graft: Secondary | ICD-10-CM | POA: Diagnosis not present

## 2020-07-12 LAB — LIPID PANEL
Chol/HDL Ratio: 4.3 ratio (ref 0.0–5.0)
Cholesterol, Total: 119 mg/dL (ref 100–199)
HDL: 28 mg/dL — ABNORMAL LOW (ref >39)
LDL Chol Calc (NIH): 60 mg/dL (ref 0–99)
Triglycerides: 182 mg/dL — ABNORMAL HIGH (ref 0–149)
VLDL Cholesterol Cal: 31 mg/dL (ref 5–40)

## 2020-08-17 ENCOUNTER — Telehealth: Payer: Self-pay | Admitting: Family Medicine

## 2020-08-17 NOTE — Progress Notes (Signed)
  Chronic Care Management   Note  08/17/2020 Name: [name] Benitez MRN: [id number] DOB: 23-May-1955  [name] Benitez is a 65 y.o. year old male who is a primary care patient of No primary care provider on file.. I reached out to [name] Benitez by phone today in response to a referral sent by Mr. [name] Benitez's PCP, No primary care provider on file..   [name] Benitez was given information about Chronic Care Management services today including:  CCM service includes personalized support from designated clinical staff supervised by his physician, including individualized plan of care and coordination with other care providers 24/7 contact phone numbers for assistance for urgent and routine care needs. Service will only be billed when office clinical staff spend 20 minutes or more in a month to coordinate care. Only one practitioner may furnish and bill the service in a calendar month. The patient may stop CCM services at any time (effective at the end of the month) by phone call to the office staff.   Patient agreed to services and verbal consent obtained.   Follow up plan:   [name] Benitez

## 2020-09-10 ENCOUNTER — Telehealth: Payer: Self-pay

## 2020-09-10 NOTE — Progress Notes (Signed)
Chronic Care Management Pharmacy Assistant   Name: BYAN HISER  MRN: [id number] DOB: [birth year]  [name] Benitez is an 65 y.o. year old male who presents for his initial CCM visit with the clinical pharmacist.  Reason for Encounter: Chart Prep / Initial CCM Visit    Recent office visits:  11/17/19 - Raiford Noble, PA-C - Visit for preventive health - Labs were ordered. Referral placed to Sports Medicine. Follow up not indicated.   Recent consult visits:  02/05/20  - Glenetta Hew, MD - Cardiology - CAD - Labs were ordered. EKG performed. Niacin discontinued. Follow up in July to check lipids.   Hospital visits:  None in previous 6 months  Medications: Outpatient Encounter Medications as of 09/10/2020  Medication Sig   acetaminophen (TYLENOL) 500 MG tablet Take 500 mg by mouth every 6 (six) hours as needed.   aspirin EC 81 MG tablet Take 81 mg by mouth daily.   benazepril (LOTENSIN) 20 MG tablet TAKE 1 TABLET BY MOUTH EVERY DAY   fenofibrate (TRICOR) 145 MG tablet TAKE 1 TABLET BY MOUTH EVERY DAY   fish oil-omega-3 fatty acids 1000 MG capsule Take 1 g by mouth 2 (two) times daily.    meloxicam (MOBIC) 15 MG tablet Take 1 tablet (15 mg total) by mouth daily.   metoprolol succinate (TOPROL-XL) 50 MG 24 hr tablet TAKE 1 TABLET BY MOUTH EVERY DAY WITH OR IMMEDIATELY FOLLOWING A MEAL   RABEprazole (ACIPHEX) 20 MG tablet Take 1 tablet (20 mg total) by mouth daily.   sildenafil (VIAGRA) 100 MG tablet Take 1 tablet (100 mg total) by mouth daily as needed.   simvastatin (ZOCOR) 40 MG tablet Take 1 tablet (40 mg total) by mouth daily.   No facility-administered encounter medications on file as of 09/10/2020.     Have you seen any other providers since your last visit? No  Any changes in your medications or health? No  Any side effects from any medications? No  Do you have an symptoms or problems not managed by your medications? No  Any concerns about your health right now?  Yes. Patient is concerned as to why he was taken off Niacin.   Has your provider asked that you check blood pressure, blood sugar, or follow special diet at home? No  Do you get any type of exercise on a regular basis? Yes. 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   Can you think of a goal you would like to reach for your health? No. Patient reports he has lost 22 pounds in the 6 months.   Do you have any problems getting your medications? No  Is there anything that you would like to discuss during the appointment? Patient just had some questions about Dr Ellyn Hack taking him off his Niacin. He does still have 90 days at home if he should be on this medicine.    Please bring medications and supplements to appointment  Patient confirmed appointment date and time of 09/29/20 9:00am   Care Gaps  AWV: done 11/17/19 Colonoscopy: done 12/25/18 DM Eye Exam: N/A DEXA: N/A Mammogram: N/A   Star Rating Drugs: Simvastatin (ZOCOR) 40 MG tablet - last filled 08/02/20 90 days (pt reported) Benazepril (LOTENSIN) 20 MG tablet - last filled 06/11/20 90 days   Future Appointments  Date Time Provider [place]  09/29/2020  9:00 AM [facility] CCM PHARMACIST [facility]  12/08/2020  3:00 PM Wendie Agreste,  MD [facility] [place], [place] Clinical Pharmacist Assistant  [phone]  Time Spent: 45 minutes

## 2020-09-28 NOTE — Progress Notes (Signed)
Chronic Care Management Pharmacy Note  09/29/2020 Name:  [name] Benitez MRN:  [id number] DOB:  10-01-1955  Summary: No Rx changes Previous pt of Elyn Aquas, TOC planned with Dr Carlota Raspberry 12/08/2020 Could consider intensification of statin therapy to atorvastatin 80 mg due to history of MI/revascularization and <65 y.o.  Subjective: [name] Benitez is an 65 y.o. year old male who is a primary patient of No primary care provider on file..  The CCM team was consulted for assistance with disease management and care coordination needs.    Engaged with patient by telephone for follow up visit in response to provider referral for pharmacy case management and/or care coordination services.   Consent to Services:  The patient was given the following information about Chronic Care Management services today, agreed to services, and gave verbal consent: 1. CCM service includes personalized support from designated clinical staff supervised by the primary care provider, including individualized plan of care and coordination with other care providers 2. 24/7 contact phone numbers for assistance for urgent and routine care needs. 3. Service will only be billed when office clinical staff spend 20 minutes or more in a month to coordinate care. 4. Only one practitioner may furnish and bill the service in a calendar month. 5.The patient may stop CCM services at any time (effective at the end of the month) by phone call to the office staff. 6. The patient will be responsible for cost sharing (co-pay) of up to 20% of the service fee (after annual deductible is met). Patient agreed to services and consent obtained.  Patient Care Team: Leonie Man, MD as PCP - Cardiology (Cardiology) Leonie Man, MD as Consulting Physician (Cardiology) Madelin Rear, [facility] as Pharmacist (Pharmacist)  Hospital visits: None in previous 6 months  Objective:  Lab Results  Component Value Date   CREATININE 0.99  11/17/2019   CREATININE 1.15 11/14/2018   CREATININE 0.92 11/06/2017    Lab Results  Component Value Date   HGBA1C 6.3 11/17/2019   HGBA1C 6.1 11/14/2018   HGBA1C 6.1 11/06/2017   Last diabetic Eye exam: No results found for: HMDIABEYEEXA  Last diabetic Foot exam: No results found for: HMDIABFOOTEX      Component Value Date/Time   CHOL 119 07/12/2020 1018   CHOL 104 11/17/2019 0912   CHOL 122 11/14/2018 0915   CHOL 97 12/19/2017 0958   CHOL 118 02/12/2017 0818   TRIG 182 (H) 07/12/2020 1018   TRIG 121.0 11/17/2019 0912   TRIG 133.0 11/14/2018 0915   HDL 28 (L) 07/12/2020 1018   HDL 31.80 (L) 11/17/2019 0912   HDL 40.10 11/14/2018 0915   HDL 41.00 12/19/2017 0958   HDL 37 (L) 02/12/2017 0818   CHOLHDL 4.3 07/12/2020 1018   CHOLHDL 3 11/17/2019 0912   VLDL 24.2 11/17/2019 0912   LDLCALC 60 07/12/2020 1018   LDLCALC 48 11/17/2019 0912   LDLCALC 55 11/14/2018 0915   LDLCALC 42 12/19/2017 0958   LDLCALC 58 02/12/2017 0818   LDLDIRECT 60.0 11/06/2017 1555    Hepatic Function Latest Ref Rng & Units 11/17/2019 11/14/2018 11/06/2017  Total Protein 6.0 - 8.3 g/dL 6.9 6.8 6.4  Albumin 3.5 - 5.2 g/dL 4.2 4.3 4.2  AST 0 - 37 U/L 21 26 20   ALT 0 - 53 U/L 30 35 27  Alk Phosphatase 39 - 117 U/L 69 44 56  Total Bilirubin 0.2 - 1.2 mg/dL 0.7 0.8 1.0  Bilirubin, Direct 0.0 - 0.3 mg/dL - - -  Lab Results  Component Value Date/Time   TSH 0.873 Test methodology is 3rd generation TSH 03/13/2007 10:10 AM    CBC Latest Ref Rng & Units 11/17/2019 11/14/2018 11/06/2017  WBC 4.0 - 10.5 K/uL 6.5 6.1 5.4  Hemoglobin 13.0 - 17.0 g/dL 16.8 16.9 17.1(H)  Hematocrit 39.0 - 52.0 % 51.0 51.0 49.6  Platelets 150.0 - 400.0 K/uL 270.0 264.0 189.0    No results found for: VD25OH  Clinical ASCVD:  The ASCVD Risk score (Arnett DK, et al., 2019) failed to calculate for the following reasons:   The valid total cholesterol range is 130 to 320 mg/dL   Unable to determine if patient is  Non-Hispanic African American   Social History   Tobacco Use  Smoking Status Some Days   Types: Cigarettes   Last attempt to quit: 10/26/2003   Years since quitting: 16.9  Smokeless Tobacco Never   BP Readings from Last 3 Encounters:  02/05/20 110/80  11/17/19 138/80  01/16/19 126/87   Pulse Readings from Last 3 Encounters:  02/05/20 85  11/17/19 91  01/16/19 76   Wt Readings from Last 3 Encounters:  02/05/20 203 lb 9.6 oz (92.4 kg)  11/17/19 206 lb (93.4 kg)  01/16/19 206 lb 6.4 oz (93.6 kg)   BMI Readings from Last 3 Encounters:  02/05/20 32.86 kg/m  11/17/19 32.26 kg/m  01/16/19 32.33 kg/m    Assessment: Review of patient past medical history, allergies, medications, health status, including review of consultants reports, laboratory and other test data, was performed as part of comprehensive evaluation and provision of chronic care management services.   SDOH:  (Social Determinants of Health) assessments and interventions performed: Yes   CCM Care Plan  No Known Allergies  Medications Reviewed Today     Reviewed by Madelin Rear, [facility] (Pharmacist) on 09/29/20 at 1001  Med List Status: <None>   Medication Order Taking? Sig Documenting Provider Last Dose Status Informant  acetaminophen (TYLENOL) 500 MG tablet [id number]  Take 500 mg by mouth every 6 (six) hours as needed. [provider]  Active   aspirin EC 81 MG tablet [id number] Yes Take 81 mg by mouth daily. [provider]  Active Self  benazepril (LOTENSIN) 20 MG tablet [id number] Yes TAKE 1 TABLET BY MOUTH EVERY DAY Leonie Man, MD  Active   fish oil-omega-3 fatty acids 1000 MG capsule [id number] Yes Take 1 g by mouth 2 (two) times daily.  [provider]  Active Self  metoprolol succinate (TOPROL-XL) 50 MG 24 hr tablet [id number] Yes TAKE 1 TABLET BY MOUTH EVERY DAY WITH OR IMMEDIATELY FOLLOWING A MEAL Leonie Man, MD  Active   RABEprazole (ACIPHEX) 20 MG tablet [id number] Yes  Take 1 tablet (20 mg total) by mouth daily. Leonie Man, MD  Active   sildenafil (VIAGRA) 100 MG tablet [id number] Yes Take 1 tablet (100 mg total) by mouth daily as needed. Leonie Man, MD  Active   simvastatin (ZOCOR) 40 MG tablet [id number] Yes Take 1 tablet (40 mg total) by mouth daily. 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 in a stented segment that was unable to be recanalized percutaneously 04/02/2009    Immunization History  Administered Date(s) Administered   Influenza Inj Mdck Quad Pf 10/03/2017   Influenza,inj,Quad PF,6+ Mos 09/21/2018, 10/03/2019   Moderna Sars-Covid-2 Vaccination 03/29/2019, 04/26/2019   PFIZER(Purple Top)SARS-COV-2 Vaccination 11/07/2019   Tdap 11/17/2019   Conditions to be addressed/monitored: Clinical ASCVD, HTN, and HLD  Care Plan : ccm pharmacy care plan  Updates made by Madelin Rear, [place] since 09/29/2020 12:00 AM     Problem: hld htn cad preDM   Priority: High     Long-Range Goal: Patient-Specific Goal   Start Date: 09/29/2020  Expected End Date: 09/29/2021  This Visit's Progress: On track  Priority: High  Note:   Pharmacist Clinical Goal(s):  Patient will verbalize ability to afford treatment regimen through collaboration with PharmD and provider.   Interventions: 1:1 collaboration with No primary care provider on file. regarding development and update of comprehensive plan of care as evidenced by provider attestation and co-signature Inter-disciplinary care team collaboration (see longitudinal plan of care) Comprehensive medication  review performed; medication list updated in electronic medical record  Hypertension (BP goal <130/80) -Controlled -Current treatment: Metoprolol succinate 50 mg once daily  Benazepril 20 mg once daily  -Medications previously tried: n/a  -Current home readings: n/a -Denies hypotensive/hypertensive symptoms -Educated on BP goals and benefits of medications for prevention of heart attack, stroke and kidney damage; -Counseled to monitor BP at home 1-2x/wk, document, and provide log at future appointments -Recommended to continue current medication  Hyperlipidemia: (LDL goal < 70) -Controlled -CABGx2 >10 yrs ago, STEMI Hx 2005on ASA 61m -Previously declined nutrition referral  -Current treatment: Simvastatin 40 mg once daily  -Medications previously tried: fenofibrate, niaspan -Educated on Cholesterol goals;  Benefits of statin for ASCVD risk reduction; -Recommended to continue current medication -Dietary counseling performed at length  - agreeable to cutting down on fatty and high carb foods such as bread Could consider intensification of statin therapy due to history of MI/revascularization. No intolerance noted or reported.  Prediabetes (A1c goal <6.5%) -Controlled -Last a1c 11/2019 - increased to 6.3% 11/2019 from 6.1 -Current medications: No current medications -Medications previously tried: n/a  -Current home glucose readings fasting glucose: n/a post prandial glucose: n/a -Denies hypoglycemic/hyperglycemic symptoms -Current meal patterns: tries to eat less fatty foods. breakfast: hot dog, breakfast bar. Water more often now. does not cook, no fruits or vegetables. Typically eating out or prepares easy meals.  -Current exercise: still working, physically active through this. Will also consider joining [employer] using silver sneakers benefit   ED (minimize symptoms, optimize medication safety) -Controlled -Cost has been an issue but has enough on hand -Feels there is an  ongoing benefit to his current rx.  -Current regimen: Sildenafil 100 mg as needed  -No problems noted -Continue current management   Patient Goals/Self-Care Activities Patient will:  - take medications as prescribed Major focus   Medication Assistance: None required.  Patient affirms current coverage meets needs.  Patient's preferred pharmacy is:  [id number] [place], [place] - [address] [place] [phone]: [phone]: [phone] endorses near 100% compliance  Follow Up:  Patient agrees to Care Plan and Follow-up. Plan: HC 3 month adherence. Pharmacist 6 month telephone visit.     Future Appointments  Date Time Provider Department  Center  12/08/2020  3:00 PM Wendie Agreste, MD [facility]  03/30/2021  1:00 PM [facility] PHARMACIST [facility]   Madelin Rear, PharmD, CPP Clinical Pharmacist Practitioner  [place]  [phone]

## 2020-09-29 ENCOUNTER — Ambulatory Visit (INDEPENDENT_AMBULATORY_CARE_PROVIDER_SITE_OTHER): Payer: Medicare HMO

## 2020-09-29 ENCOUNTER — Other Ambulatory Visit: Payer: Self-pay

## 2020-09-29 DIAGNOSIS — I10 Essential (primary) hypertension: Secondary | ICD-10-CM

## 2020-09-29 DIAGNOSIS — E785 Hyperlipidemia, unspecified: Secondary | ICD-10-CM

## 2020-09-29 NOTE — Patient Instructions (Addendum)
[name] Benitez,  Thank you for talking with me today. I have included our care plan/goals in the following pages.   Please review and call me at [phone] with any questions.  Thanks! Ellin Mayhew, PharmD Clinical Pharmacist  [phone]  Care Plan : ccm pharmacy care plan  Updates made by Madelin Rear, [facility] since 09/29/2020 12:00 AM     Problem: hld htn cad preDM   Priority: High     Long-Range Goal: Patient-Specific Goal   Start Date: 09/29/2020  Expected End Date: 09/29/2021  This Visit's Progress: On track  Priority: High  Note:   Pharmacist Clinical Goal(s):  Patient will verbalize ability to afford treatment regimen through collaboration with PharmD and provider.   Interventions: 1:1 collaboration with No primary care provider on file. regarding development and update of comprehensive plan of care as evidenced by provider attestation and co-signature Inter-disciplinary care team collaboration (see longitudinal plan of care) Comprehensive medication review performed; medication list updated in electronic medical record  Hypertension (BP goal <130/80) -Controlled -Current treatment: Metoprolol succinate 50 mg once daily  Benazepril 20 mg once daily  -Medications previously tried: n/a  -Current home readings: n/a -Denies hypotensive/hypertensive symptoms -Educated on BP goals and benefits of medications for prevention of heart attack, stroke and kidney damage; -Counseled to monitor BP at home 1-2x/wk, document, and provide log at future appointments -Recommended to continue current medication  Hyperlipidemia: (LDL goal < 70) -Controlled -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 No intolerance noted or reported.  Prediabetes (A1c goal <6.5%) -Controlled -Last a1c 11/2019 - increased to 6.3% 11/2019 from 6.1 -Current medications: No current medications -Medications previously tried: n/a  -Current home glucose readings fasting glucose: n/a post prandial glucose: n/a -Denies hypoglycemic/hyperglycemic symptoms -Current meal patterns: tries to eat less fatty foods. breakfast: hot dog, breakfast bar. Water more often now. does not cook, no fruits or vegetables. Typically eating out or prepares easy meals.  -Current exercise: still working, physically active through this. Will also consider joining [employer] using silver sneakers benefit   ED (minimize symptoms, optimize medication safety) -Controlled -Cost has been an issue but has enough on hand -Feels there is an ongoing benefit to his current rx.  -Current regimen: Sildenafil 100 mg as needed  -No problems noted -Continue current management   Patient Goals/Self-Care Activities Patient will:  - take medications as prescribed Major focus   Medication Assistance: None required.  Patient affirms current coverage meets needs.  Patient's preferred pharmacy is:  [id number] [place], [place] - [address] [place] [id number]: [phone]: [phone] endorses near 100% compliance  Follow Up:  Patient agrees to Care Plan and Follow-up. Plan: HC 3 month adherence. Pharmacist 6 month telephone visit.     The patient was given the following information about Chronic Care Management services today, agreed to services, and gave verbal consent: 1. CCM service includes personalized support from designated clinical staff  supervised by the primary care provider, including individualized plan  of care and coordination with other care providers 2. 24/7 contact phone numbers for assistance for urgent and routine care needs. 3. Service will only be billed when office clinical staff spend 20 minutes or more in a month to coordinate care. 4. Only one practitioner may furnish and bill the service in a calendar month. 5.The patient may stop CCM services at any time (effective at the end of the month) by phone call to the office staff. 6. The patient will be responsible for cost sharing (co-pay) of up to 20% of the service fee (after annual deductible is met). Patient agreed to services and consent obtained.  The patient verbalized understanding of instructions provided today and agreed to receive a MyChart copy of patient instruction and/or educational materials. Telephone follow up appointment with pharmacy team member scheduled for: See next appointment with "Care Management Staff" under "What's Next" below.Exercising to Lose Weight Getting regular exercise is important for everyone. It is especially important if you are overweight. Being overweight increases your risk of heart disease, stroke, diabetes, high blood pressure, and several types of cancer. Exercising, and reducing the calories you consume, can help you lose weight and improve fitness and health. Exercise can be moderate or vigorous intensity. To lose weight, most people need to do a certain amount of moderate or vigorous-intensity exercise each week. How can exercise affect me? You lose weight when you exercise enough to burn more calories than you eat. Exercise also reduces body fat and builds muscle. The more muscle you have, the more calories you burn. Exercise also: Improves mood. Reduces stress and tension. Improves your overall fitness, flexibility, and endurance. Increases bone strength. Moderate-intensity exercise Moderate-intensity exercise is any  activity that gets you moving enough to burn at least three times more energy (calories) than if you were sitting. Examples of moderate exercise include: Walking a mile in 15 minutes. Doing light yard work. Biking at an easy pace. Most people should get at least 150 minutes of moderate-intensity exercise a week to maintain their body weight. Vigorous-intensity exercise Vigorous-intensity exercise is any activity that gets you moving enough to burn at least six times more calories than if you were sitting. When you exercise at this intensity, you should be working hard enough that you are not able to carry on a conversation. Examples of vigorous exercise include: Running. Playing a team sport, such as football, basketball, and soccer. Jumping rope. Most people should get at least 75 minutes a week of vigorous exercise to maintain their body weight. What actions can I take to lose weight? The amount of exercise you need to lose weight depends on: Your age. The type of exercise. Any health conditions you have. Your overall physical ability. Talk to your health care provider about how much exercise you need and what types of activities are safe for you. Nutrition  Make changes to your diet as told by your health care provider or diet and nutrition specialist (dietitian). This may include: Eating fewer calories. Eating more protein. Eating less unhealthy fats. Eating a diet that includes fresh fruits and vegetables, whole grains, low-fat dairy products, and lean protein. Avoiding foods with added fat, salt, and sugar. Drink plenty of water while you exercise to prevent dehydration or heat stroke. Activity Choose an activity that you enjoy and set realistic goals. Your health care provider can help you make an exercise plan that works for you. Exercise at a moderate or vigorous intensity most days of the week. The intensity of  exercise may vary from person to person. You can tell how  intense a workout is for you by paying attention to your breathing and heartbeat. Most people will notice their breathing and heartbeat get faster with more intense exercise. Do resistance training twice each week, such as: Push-ups. Sit-ups. Lifting weights. Using resistance bands. Getting short amounts of exercise can be just as helpful as long, structured periods of exercise. If you have trouble finding time to exercise, try doing these things as part of your daily routine: Get up, stretch, and walk around every 30 minutes throughout the day. Go for a walk during your lunch break. Park your car farther away from your destination. If you take public transportation, get off one stop early and walk the rest of the way. Make phone calls while standing up and walking around. Take the stairs instead of elevators or escalators. Wear comfortable clothes and shoes with good support. Do not exercise so much that you hurt yourself, feel dizzy, or get very short of breath. Where to find more information [place]. Department of Health and Human Services: [employer]at Centers for Disease Control and Prevention: http://www.wolf.info/ Contact a health care provider: Before starting a new exercise program. If you have questions or concerns about your weight. If you have a medical problem that keeps you from exercising. Get help right away if: You have any of the following while exercising: Injury. Dizziness. Difficulty breathing or shortness of breath that does not go away when you stop exercising. Chest pain. Rapid heartbeat. These symptoms may represent a serious problem that is an emergency. Do not wait to see if the symptoms will go away. Get medical help right away. Call your local emergency services (911 in the [place] Do not drive yourself to the hospital. Summary Getting regular exercise is especially important if you are overweight. Being overweight increases your risk of heart disease, stroke, diabetes,  high blood pressure, and several types of cancer. Losing weight happens when you burn more calories than you eat. Reducing the amount of calories you eat, and getting regular moderate or vigorous exercise each week, helps you lose weight. This information is not intended to replace advice given to you by your health care provider. Make sure you discuss any questions you have with your health care provider. Document Revised: 02/15/2020 Document Reviewed: 02/15/2020 Elsevier Patient Education  2022 [place] for Gastroesophageal Reflux Disease, Adult When you have gastroesophageal reflux disease (GERD), the foods you eat and your eating habits are very important. Choosing the right foods can help ease your discomfort. Think about working with a food expert (dietitian) to help you make good choices. What are tips for following this plan? Reading food labels Look for foods that are low in saturated fat. Foods that may help with your symptoms include: Foods that have less than 5% of daily value (DV) of fat. Foods that have 0 grams of trans fat. Cooking Do not fry your food. Cook your food by baking, steaming, grilling, or broiling. These are all methods that do not need a lot of fat for cooking. To add flavor, try to use herbs that are low in spice and acidity. Meal planning  Choose healthy foods that are low in fat, such as: Fruits and vegetables. Whole grains. Low-fat dairy products. Lean meats, fish, and poultry. Eat small meals often instead of eating 3 large meals each day. Eat your meals slowly in a place where you are relaxed. Avoid bending over or lying down  until 2-3 hours after eating. Limit high-fat foods such as fatty meats or fried foods. Limit your intake of fatty foods, such as oils, butter, and shortening. Avoid the following as told by your doctor: Foods that cause symptoms. These may be different for different people. Keep a food diary to keep track of foods  that cause symptoms. Alcohol. Drinking a lot of liquid with meals. Eating meals during the 2-3 hours before bed. Lifestyle Stay at a healthy weight. Ask your doctor what weight is healthy for you. If you need to lose weight, work with your doctor to do so safely. Exercise for at least 30 minutes on 5 or more days each week, or as told by your doctor. Wear loose-fitting clothes. Do not smoke or use any products that contain nicotine or tobacco. If you need help quitting, ask your doctor. Sleep with the head of your bed higher than your feet. Use a wedge under the mattress or blocks under the bed frame to raise the head of the bed. Chew sugar-free gum after meals. What foods should eat? Eat a healthy, well-balanced diet of fruits, vegetables, whole grains, low-fat dairy products, lean meats, fish, and poultry. Each person is different. Foods that may cause symptoms in one person may not cause any symptoms in another person. Work with your doctor to find foods that are safe for you. The items listed above may not be a complete list of what you can eat and drink. Contact a food expert for more options. What foods should I avoid? Limiting some of these foods may help in managing the symptoms of GERD. Everyone is different. Talk with a food expert or your doctor to help you find the exact foods to avoid, if any. Fruits Any fruits prepared with added fat. Any fruits that cause symptoms. For some people, this may include citrus fruits, such as oranges, grapefruit, pineapple, and lemons. Vegetables Deep-fried vegetables. [place] fries. Any vegetables prepared with added fat. Any vegetables that cause symptoms. For some people, this may include tomatoes and tomato products, chili peppers, onions and garlic, and horseradish. Grains Pastries or quick breads with added fat. Meats and other proteins High-fat meats, such as fatty beef or pork, hot dogs, ribs, ham, sausage, salami, and bacon. Fried meat or  protein, including fried fish and fried chicken. Nuts and nut butters, in large amounts. Dairy Whole milk and chocolate milk. Sour cream. Cream. Ice cream. Cream cheese. Milkshakes. Fats and oils Butter. Margarine. Shortening. Ghee. Beverages Coffee and tea, with or without caffeine. Carbonated beverages. Sodas. Energy drinks. Fruit juice made with acidic fruits, such as orange or grapefruit. Tomato juice. Alcoholic drinks. Sweets and desserts Chocolate and cocoa. Donuts. Seasonings and condiments Pepper. Peppermint and spearmint. Added salt. Any condiments, herbs, or seasonings that cause symptoms. For some people, this may include curry, hot sauce, or vinegar-based salad dressings. The items listed above may not be a complete list of what you should not eat and drink. Contact a food expert for more options. Questions to ask your doctor Diet and lifestyle changes are often the first steps that are taken to manage symptoms of GERD. If diet and lifestyle changes do not help, talk with your doctor about taking medicines. Where to find more information International Foundation for Gastrointestinal Disorders: aboutgerd.org Summary When you have GERD, food and lifestyle choices are very important in easing your symptoms. Eat small meals often instead of 3 large meals a day. Eat your meals slowly and in a place where  you are relaxed. Avoid bending over or lying down until 2-3 hours after eating. Limit high-fat foods such as fatty meats or fried foods. This information is not intended to replace advice given to you by your health care provider. Make sure you discuss any questions you have with your health care provider. Document Revised: 06/30/2019 Document Reviewed: 06/30/2019 Elsevier Patient Education  [name] Benitez.

## 2020-10-01 DIAGNOSIS — E785 Hyperlipidemia, unspecified: Secondary | ICD-10-CM | POA: Diagnosis not present

## 2020-10-01 DIAGNOSIS — I10 Essential (primary) hypertension: Secondary | ICD-10-CM | POA: Diagnosis not present

## 2020-11-27 ENCOUNTER — Other Ambulatory Visit: Payer: Self-pay | Admitting: Cardiology

## 2020-12-08 ENCOUNTER — Encounter: Payer: Medicare HMO | Admitting: Family Medicine

## 2021-02-09 ENCOUNTER — Ambulatory Visit (INDEPENDENT_AMBULATORY_CARE_PROVIDER_SITE_OTHER): Payer: Medicare HMO | Admitting: Family Medicine

## 2021-02-09 ENCOUNTER — Encounter: Payer: Self-pay | Admitting: Family Medicine

## 2021-02-09 VITALS — BP 134/76 | HR 88 | Temp 98.0°F | Resp 15 | Ht 66.5 in | Wt 198.0 lb

## 2021-02-09 DIAGNOSIS — Z951 Presence of aortocoronary bypass graft: Secondary | ICD-10-CM

## 2021-02-09 DIAGNOSIS — M25511 Pain in right shoulder: Secondary | ICD-10-CM

## 2021-02-09 DIAGNOSIS — I10 Essential (primary) hypertension: Secondary | ICD-10-CM

## 2021-02-09 DIAGNOSIS — G8929 Other chronic pain: Secondary | ICD-10-CM | POA: Diagnosis not present

## 2021-02-09 DIAGNOSIS — Z8719 Personal history of other diseases of the digestive system: Secondary | ICD-10-CM | POA: Diagnosis not present

## 2021-02-09 DIAGNOSIS — N5201 Erectile dysfunction due to arterial insufficiency: Secondary | ICD-10-CM

## 2021-02-09 DIAGNOSIS — E785 Hyperlipidemia, unspecified: Secondary | ICD-10-CM | POA: Diagnosis not present

## 2021-02-09 DIAGNOSIS — R12 Heartburn: Secondary | ICD-10-CM

## 2021-02-09 MED ORDER — RABEPRAZOLE SODIUM 20 MG PO TBEC: 20.0000 mg | DELAYED_RELEASE_TABLET | Freq: Every day | ORAL | 3 refills | Status: DC

## 2021-02-09 MED ORDER — METOPROLOL SUCCINATE ER 50 MG PO TB24: ORAL_TABLET | ORAL | 2 refills | Status: DC

## 2021-02-09 MED ORDER — SILDENAFIL CITRATE 20 MG PO TABS: 20.0000 mg | ORAL_TABLET | Freq: Once | ORAL | 1 refills | Status: AC

## 2021-02-09 NOTE — Progress Notes (Signed)
Subjective:  Patient ID: [name] Benitez, male    DOB: [birth year]  Age: 66 y.o. MRN: [id number]  CC:  Chief Complaint  Patient presents with   Establish Care    Pt here to establish care, pt reports some ongoing Rt arm/shoulder pain for 3 years but no other current abnormalities     HPI AEMON KOELLER presents for   New patient to establish care.  Previous PCP Elyn Aquas, PA-C.  Cardiac: Cardiologist Dr. Ellyn Hack.  History of CAD, with prior urgent CABG x2 in April 2011 for subtotal occlusion of the LAD and a stented segment that was unable to be recanalized percutaneously.  Last visit in 2022.  Plan for stress testing in 2024. Hypertension, hyperlipidemia, treated with Zocor, Lotensin, metoprolol, aspirin 81 mg daily. Slight sensation in chest with eating today only, no other chest pains. No CP/dyspnea with exertion, but not very active. 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  Cardiology appt 04/06/21.   Lab Results  Component Value Date   CHOL 119 07/12/2020   HDL 28 (L) 07/12/2020   LDLCALC 60 07/12/2020   LDLDIRECT 60.0 11/06/2017   TRIG 182 (H) 07/12/2020   CHOLHDL 4.3 07/12/2020     Erectile dysfunction Previously treated with Viagra 100 mg.  1/2 pill works well. Face flushing, but no new vision, hearing changes or chest pain/dyspnea with exertion.  Has discussed use with cardiology in past.   Right shoulder pain Past few years. No surgery. Notice soreness with getting in out of loader few years ago. Still occasional pain. R hand dominant.  Some impact on climbing in and out of loader. No prior xray or ortho eval. No recent changes, no nsaids. Tylenol BID helps some.   Nicotine addiction Smoking had decreased when meeting with cardiology last visit in 2022.  Had decreased to 1 to 2 cigarettes/week.  History of GI bleed Years ago - no recent blood in stools. On ASA daily.  Taking rabeprazole. Dark stools have been noted at times. Once  a month. No tarry stools. No iron/pepto.  Rare heartburn with certain foods. Usually controlled daily on PPI Aciphex.   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  50% PDA--referred for CABG x2.   Coronary stent restenosis due to progression of disease 04/2009   Subtotal occlusion of the LAD with ISR, unable to cross with wire; aneurysmal dilation of the LAD prior to stent   Current occasional smoker    He initially quit around the time of his CABG, but he has gotten back to smoking maybe 1 or 2 a day as a stress relief.  Does not smoke every day.   Dyslipidemia, goal LDL below 70    Erectile dysfunction    History of GI bleed    No recurrence   History of vertigo    Hypertension    S/P CABG x 03 April 2009   LIMA-LAD, SVG-RPDA (to bypass a roughly 50% lesion); post CABG stress normal with no ischemia   ST elevation (STEMI) myocardial infarction involving left anterior descending coronary artery [facility]) October 2005   LAD PCI with 2 overlapping Cypher DES 3.0 mm 23 mm   Past Surgical History:  Procedure Laterality Date   CARDIAC CATHETERIZATION  04/28/2009   Subtotal occlusion of mid LAD with ISR/thrombosis, unable to pass --> urgent CABG (Dr. Pecola Lawless)   COLONOSCOPY  2005   CORONARY ANGIOPLASTY WITH STENT PLACEMENT  10/26/2003   anterior ischemia with significant reversibility (mid anterior and anteroseptal, apical  anterior and apical anteroseptal)  - Cypher 3x67mm stent to mid LAD and Cypher 3x55mm Cypher DES to prox LAD(Dr. Gerrie Nordmann)   CORONARY ARTERY BYPASS GRAFT  04/2009   LIMA-LAD, reverse SVG-distal RCA (Dr. Chauncey Cruel. Hendrickson)   DOPPLER ECHOCARDIOGRAPHY  04/2009   EF 50-50%, borderline septal hypokinesis   Exercise Tolerance Test  June 20/15   Exercise 8:05 Min, 10.1 METs, reached 99% max. Peak HR -- 160 bpm; NEGATIVE Bruce TM GXT = no EKG evidence of ischemia. Prolonged heart rate and blood pressure recovery and PVCs noted that resolve with Exerction.   NM MYOVIEW LTD  05/2018   EF 45%.  Low risk.  No ischemia or infarction.   No Known Allergies Prior to Admission medications   Medication Sig Start Date End Date Taking? Authorizing Provider  acetaminophen (TYLENOL) 500 MG tablet Take 500 mg by mouth every 6 (six) hours as needed.   Yes [provider]  aspirin EC 81 MG tablet Take 81 mg by mouth daily.   Yes [provider]  benazepril (LOTENSIN) 20 MG tablet TAKE 1 TABLET BY MOUTH EVERY DAY 11/29/20  Yes Leonie Man, MD  fish oil-omega-3 fatty acids 1000 MG capsule Take 1 g by mouth 2 (two) times daily.    Yes [provider]  metoprolol succinate (TOPROL-XL) 50 MG 24 hr tablet TAKE 1 TABLET BY MOUTH EVERY DAY WITH OR IMMEDIATELY FOLLOWING A MEAL 04/27/20  Yes Leonie Man, MD  RABEprazole (ACIPHEX) 20 MG tablet Take 1 tablet (20 mg total) by mouth daily. 04/22/20  Yes Leonie Man, MD  sildenafil (VIAGRA) 100 MG tablet Take 1 tablet (100 mg total) by mouth daily as needed. 02/05/20  Yes Leonie Man, MD  simvastatin (ZOCOR) 40 MG tablet Take 1 tablet (40 mg total) by mouth daily. 04/15/20  Yes Leonie Man, MD   Social History   Socioeconomic History   Marital status: Divorced    Spouse name: Not on file   Number of children: 1   Years of education: 11   Highest education level: Not on file  Occupational History    Employer: [employer]   Tobacco Use   Smoking status: Some Days    Types: Cigarettes    Last attempt to quit: 10/26/2003    Years since quitting: 17.3   Smokeless tobacco: Never  Vaping Use   Vaping Use: Never used  Substance and Sexual Activity   Alcohol use: Yes    Comment: occas   Drug use: No   Sexual activity: Yes    Birth control/protection: Condom  Other Topics Concern   Not on file  Social History Narrative   Divorced father of one, grandfather 2. No longer smokes -- quit in 2005. Works for [employer] in [place]. Does not get routine activity/exercise -- Basically, he is not  motivated to do any exercise. He, by himself, is admittedly lazy. Does not drink alcohol.   Social Determinants of Health   Financial Resource Strain: Not on file  Food Insecurity: Not on file  Transportation Needs: Not on file  Physical Activity: Not on file  Stress: Not on file  Social Connections: Not on file  Intimate Partner Violence: Not on file    Review of Systems Per HPI  Objective:   Vitals:   02/09/21 1508  BP: 134/76  Pulse: 88  Resp: 15  Temp: 98 F (36.7 C)  TempSrc: Temporal  SpO2: 96%  Weight: 198 lb (89.8 kg)  Height: 5' 6.5" (1.689 m)     Physical Exam Vitals reviewed.  Constitutional:      Appearance: He is well-developed.  HENT:     Head: Normocephalic and atraumatic.  Neck:     Vascular: No carotid bruit or JVD.  Cardiovascular:     Rate and Rhythm: Normal rate and regular rhythm.     Heart sounds: Normal heart sounds. No murmur heard.    Comments: Chest nontender.  Pulmonary:     Effort: Pulmonary effort is normal.     Breath sounds: Normal breath sounds. No rales.  Abdominal:     General: There is no distension.     Tenderness: There is no abdominal tenderness. There is no guarding.  Musculoskeletal:     Right lower leg: No edema.     Left lower leg: No edema.     Comments: Shoulder exam deferred, but locates area near right shoulder, distal to the  antecubital fossa of his right elbow.  Skin intact without erythema.  Skin:    General: Skin is warm and dry.  Neurological:     Mental Status: He is alert and oriented to person, place, and time.  Psychiatric:        Mood and Affect: Mood normal.        Behavior: Behavior normal.       Assessment & Plan:  MELCHOR KIRCHGESSNER is a 66 y.o. male . Essential hypertension - Plan: metoprolol succinate (TOPROL-XL) 50 MG 24 hr tablet  -  Stable, tolerating current regimen.  Appears to have refills until planned cardiology follow-up, Toprol refill extended.  Likely will have labs at cardiology follow-up, deferred at this time.  Chronic right shoulder pain  -Denies recent changes in symptoms.  Plan individual appointment to further address.  Tylenol as needed for now.  Dyslipidemia, goal LDL below 70  -Most recent testing reassuring, continue same dose statin.  Anticipate labs at cardiology visit.  Erectile dysfunction due to arterial insufficiency - Plan: sildenafil (REVATIO) 20 MG tablet  -Sildenafil Rx given - use lowest effective dose. Side effects discussed (including but not limited to headache/flushing, blue discoloration of vision, possible vascular steal and risk of cardiac effects if underlying unknown coronary artery disease, and permanent sensorineural hearing loss). Understanding expressed.  History of GI bleed - Plan: RABEprazole (ACIPHEX) 20 MG tablet Heartburn - Plan: RABEprazole (ACIPHEX) 20 MG tablet  -Continue PPI, trigger avoidance, handout given.  Chest discomfort/sensation noted with eating today but otherwise has not had any chest pain.  RTC/ER precautions given if any recurrence of chest symptoms.  CAD  - S/P urgent CABG x 2 for subtotal occlusion of the LAD in a stented segment that was unable to be recanalized percutaneously - Plan: metoprolol succinate (TOPROL-XL) 50 MG 24 hr tablet  -Asymptomatic, slight discomfort/sensation with eating today only.  Less likely  cardiac, ER/RTC precautions given.  Continue follow-up with cardiology, continue hypertension, lipid control as above.  Meds ordered this encounter  Medications   sildenafil (REVATIO) 20 MG tablet    Sig: Take 1-2 tablets (20-40 mg total) by mouth once for 1 dose. 30 min prior to intercourse.    Dispense:  20 tablet    Refill:  1   metoprolol succinate (TOPROL-XL) 50 MG 24 hr tablet    Sig: TAKE 1 TABLET BY MOUTH EVERY DAY WITH OR IMMEDIATELY FOLLOWING A MEAL    Dispense:  90 tablet    Refill:  2   RABEprazole (ACIPHEX) 20 MG tablet    Sig: Take 1 tablet (20 mg total) by mouth daily.    Dispense:  90 tablet    Refill:  3   Patient Instructions  Keep follow up with cardiology as planned.If any return of chest discomfort be seen right away. May have been food related today, but again be seen if sensation returns.   Try to avoid foods that can flare heartburn. If any dark stools be seen to make sure not from your intestines.   Tylenol is fine for now, but follow up to evaluate your shoulder and arm further next month.   Try lowest effective dose of sildenafil.  Can check to see where that medication may be least costly.  Take printed prescription.  Can also use discount card provided today.  Good talking to you today.  We will see you next month.  Food Choices for Gastroesophageal Reflux Disease, Adult When you have gastroesophageal reflux disease (GERD), the foods you eat and your eating habits are very important. Choosing the right foods can help ease the discomfort of GERD. Consider working with a dietitian to help you make healthy food choices. What are tips for following this plan? Reading food labels Look for foods that are low in saturated fat. Foods that have less than 5% of daily value (DV) of fat and 0 g of trans fats may help with your symptoms. Cooking Cook foods using methods other than frying. This may include baking, steaming, grilling, or broiling. These are all methods  that do not need a lot of fat for cooking. To add flavor, try to use herbs that are low in spice and acidity. Meal planning  Choose healthy foods that are low in fat, such as fruits, vegetables, whole grains, low-fat dairy products, lean meats, fish, and poultry. Eat frequent, small meals instead of three large meals each day. Eat your meals slowly, in a relaxed setting. Avoid bending over or lying down until 2-3 hours after eating. Limit high-fat foods such as fatty meats or fried foods. Limit your intake of fatty foods, such as oils, butter, and shortening. Avoid the following as told by your health care provider: Foods that cause symptoms. These may be different for different people. Keep a food diary to keep track of foods that cause symptoms. Alcohol. Drinking large amounts of liquid with meals. Eating meals during the 2-3 hours before bed. Lifestyle Maintain a healthy weight. Ask your health care provider what weight is healthy for you. If you need to lose weight, work with your health care provider to do so safely. Exercise for at least 30 minutes on 5 or more days each week, or as told by your health care provider. Avoid wearing clothes that fit tightly around your waist and chest. Do not use any products that contain nicotine or tobacco. These products include cigarettes, chewing tobacco, and  vaping devices, such as e-cigarettes. If you need help quitting, ask your health care provider. Sleep with the head of your bed raised. Use a wedge under the mattress or blocks under the bed frame to raise the head of the bed. Chew sugar-free gum after mealtimes. What foods should I eat? Eat a healthy, well-balanced diet of fruits, vegetables, whole grains, low-fat dairy products, lean meats, fish, and poultry. Each person is different. Foods that may trigger symptoms in one person may not trigger any symptoms in another person. Work with your health care provider to identify foods that are safe  for you. The items listed above may not be a complete list of recommended foods and beverages. Contact a dietitian for more information. What foods should I avoid? Limiting some of these foods may help manage the symptoms of GERD. Everyone is different. Consult a dietitian or your health care provider to help you identify the exact foods to avoid, if any. Fruits Any fruits prepared with added fat. Any fruits that cause symptoms. For some people this may include citrus fruits, such as oranges, grapefruit, pineapple, and lemons. Vegetables Deep-fried vegetables. [place] fries. Any vegetables prepared with added fat. Any vegetables that cause symptoms. For some people, this may include tomatoes and tomato products, chili peppers, onions and garlic, and horseradish. Grains Pastries or quick breads with added fat. Meats and other proteins High-fat meats, such as fatty beef or pork, hot dogs, ribs, ham, sausage, salami, and bacon. Fried meat or protein, including fried fish and fried chicken. Nuts and nut butters, in large amounts. Dairy Whole milk and chocolate milk. Sour cream. Cream. Ice cream. Cream cheese. Milkshakes. Fats and oils Butter. Margarine. Shortening. Ghee. Beverages Coffee and tea, with or without caffeine. Carbonated beverages. Sodas. Energy drinks. Fruit juice made with acidic fruits, such as orange or grapefruit. Tomato juice. Alcoholic drinks. Sweets and desserts Chocolate and cocoa. Donuts. Seasonings and condiments Pepper. Peppermint and spearmint. Added salt. Any condiments, herbs, or seasonings that cause symptoms. For some people, this may include curry, hot sauce, or vinegar-based salad dressings. The items listed above may not be a complete list of foods and beverages to avoid. Contact a dietitian for more information. Questions to ask your health care provider Diet and lifestyle changes are usually the first steps that are taken to manage symptoms of GERD. If diet and  lifestyle changes do not improve your symptoms, talk with your health care provider about taking medicines. Where to find more information International Foundation for Gastrointestinal Disorders: aboutgerd.org Summary When you have gastroesophageal reflux disease (GERD), food and lifestyle choices may be very helpful in easing the discomfort of GERD. Eat frequent, small meals instead of three large meals each day. Eat your meals slowly, in a relaxed setting. Avoid bending over or lying down until 2-3 hours after eating. Limit high-fat foods such as fatty meats or fried foods. This information is not intended to replace advice given to you by your health care provider. Make sure you discuss any questions you have with your health care provider. Document Revised: 06/30/2019 Document Reviewed: 06/30/2019 Elsevier Patient Education  2022 [place],   Merri Ray, MD [place] 02/09/21 4:10 PM

## 2021-02-09 NOTE — Patient Instructions (Addendum)
Keep follow up with cardiology as planned.If any return of chest discomfort be seen right away. May have been food related today, but again be seen if sensation returns.   Try to avoid foods that can flare heartburn. If any dark stools be seen to make sure not from your intestines.   Tylenol is fine for now, but follow up to evaluate your shoulder and arm further next month.   Try lowest effective dose of sildenafil.  Can check to see where that medication may be least costly.  Take printed prescription.  Can also use discount card provided today.  Good talking to you today.  We will see you next month.  Food Choices for Gastroesophageal Reflux Disease, Adult When you have gastroesophageal reflux disease (GERD), the foods you eat and your eating habits are very important. Choosing the right foods can help ease the discomfort of GERD. Consider working with a dietitian to help you make healthy food choices. What are tips for following this plan? Reading food labels Look for foods that are low in saturated fat. Foods that have less than 5% of daily value (DV) of fat and 0 g of trans fats may help with your symptoms. Cooking Cook foods using methods other than frying. This may include baking, steaming, grilling, or broiling. These are all methods that do not need a lot of fat for cooking. To add flavor, try to use herbs that are low in spice and acidity. Meal planning  Choose healthy foods that are low in fat, such as fruits, vegetables, whole grains, low-fat dairy products, lean meats, fish, and poultry. Eat frequent, small meals instead of three large meals each day. Eat your meals slowly, in a relaxed setting. Avoid bending over or lying down until 2-3 hours after eating. Limit high-fat foods such as fatty meats or fried foods. Limit your intake of fatty foods, such as oils, butter, and shortening. Avoid the following as told by your health care provider: Foods that cause symptoms. These may  be different for different people. Keep a food diary to keep track of foods that cause symptoms. Alcohol. Drinking large amounts of liquid with meals. Eating meals during the 2-3 hours before bed. Lifestyle Maintain a healthy weight. Ask your health care provider what weight is healthy for you. If you need to lose weight, work with your health care provider to do so safely. Exercise for at least 30 minutes on 5 or more days each week, or as told by your health care provider. Avoid wearing clothes that fit tightly around your waist and chest. Do not use any products that contain nicotine or tobacco. These products include cigarettes, chewing tobacco, and vaping devices, such as e-cigarettes. If you need help quitting, ask your health care provider. Sleep with the head of your bed raised. Use a wedge under the mattress or blocks under the bed frame to raise the head of the bed. Chew sugar-free gum after mealtimes. What foods should I eat? Eat a healthy, well-balanced diet of fruits, vegetables, whole grains, low-fat dairy products, lean meats, fish, and poultry. Each person is different. Foods that may trigger symptoms in one person may not trigger any symptoms in another person. Work with your health care provider to identify foods that are safe for you. The items listed above may not be a complete list of recommended foods and beverages. Contact a dietitian for more information. What foods should I avoid? Limiting some of these foods may help manage the symptoms of  GERD. Everyone is different. Consult a dietitian or your health care provider to help you identify the exact foods to avoid, if any. Fruits Any fruits prepared with added fat. Any fruits that cause symptoms. For some people this may include citrus fruits, such as oranges, grapefruit, pineapple, and lemons. Vegetables Deep-fried vegetables. [place] fries. Any vegetables prepared with added fat. Any vegetables that cause symptoms. For some  people, this may include tomatoes and tomato products, chili peppers, onions and garlic, and horseradish. Grains Pastries or quick breads with added fat. Meats and other proteins High-fat meats, such as fatty beef or pork, hot dogs, ribs, ham, sausage, salami, and bacon. Fried meat or protein, including fried fish and fried chicken. Nuts and nut butters, in large amounts. Dairy Whole milk and chocolate milk. Sour cream. Cream. Ice cream. Cream cheese. Milkshakes. Fats and oils Butter. Margarine. Shortening. Ghee. Beverages Coffee and tea, with or without caffeine. Carbonated beverages. Sodas. Energy drinks. Fruit juice made with acidic fruits, such as orange or grapefruit. Tomato juice. Alcoholic drinks. Sweets and desserts Chocolate and cocoa. Donuts. Seasonings and condiments Pepper. Peppermint and spearmint. Added salt. Any condiments, herbs, or seasonings that cause symptoms. For some people, this may include curry, hot sauce, or vinegar-based salad dressings. The items listed above may not be a complete list of foods and beverages to avoid. Contact a dietitian for more information. Questions to ask your health care provider Diet and lifestyle changes are usually the first steps that are taken to manage symptoms of GERD. If diet and lifestyle changes do not improve your symptoms, talk with your health care provider about taking medicines. Where to find more information International Foundation for Gastrointestinal Disorders: aboutgerd.org Summary When you have gastroesophageal reflux disease (GERD), food and lifestyle choices may be very helpful in easing the discomfort of GERD. Eat frequent, small meals instead of three large meals each day. Eat your meals slowly, in a relaxed setting. Avoid bending over or lying down until 2-3 hours after eating. Limit high-fat foods such as fatty meats or fried foods. This information is not intended to replace advice given to you by your health care  provider. Make sure you discuss any questions you have with your health care provider. Document Revised: 06/30/2019 Document Reviewed: 06/30/2019 Elsevier Patient Education  [place].

## 2021-02-24 ENCOUNTER — Other Ambulatory Visit: Payer: Self-pay | Admitting: Cardiology

## 2021-03-09 ENCOUNTER — Ambulatory Visit (INDEPENDENT_AMBULATORY_CARE_PROVIDER_SITE_OTHER): Payer: Medicare HMO | Admitting: Family Medicine

## 2021-03-09 ENCOUNTER — Encounter: Payer: Self-pay | Admitting: Family Medicine

## 2021-03-09 VITALS — BP 118/70 | HR 72 | Temp 98.1°F | Resp 15 | Ht 66.5 in | Wt 193.4 lb

## 2021-03-09 DIAGNOSIS — M25521 Pain in right elbow: Secondary | ICD-10-CM

## 2021-03-09 DIAGNOSIS — M25511 Pain in right shoulder: Secondary | ICD-10-CM

## 2021-03-09 DIAGNOSIS — M7521 Bicipital tendinitis, right shoulder: Secondary | ICD-10-CM | POA: Diagnosis not present

## 2021-03-09 DIAGNOSIS — G8929 Other chronic pain: Secondary | ICD-10-CM | POA: Diagnosis not present

## 2021-03-09 NOTE — Progress Notes (Signed)
Subjective:  Patient ID: [name] Benitez, male    DOB: 01/10/1955  Age: 66 y.o. MRN: [id number]  CC:  Chief Complaint  Patient presents with   Arm Pain    Pt reports the Rt arm and shoulder pain has improved some but is still present     HPI [name] Benitez presents for    Right shoulder pain: Noted at last visit when he was establishing care.  Symptoms for few years.  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 Initial soreness getting out of a loader few years ago.  No recent changes.  No NSAIDs -history of GI bleed. Symptoms about 2 years.  R hand dominant. Changes how he lifts, but not limiting ADL's.  No neck pain.  Sore in upper bicep and fold of elbow only at times. .  No weakness.   Tx: tylenol.   History Patient Active Problem List   Diagnosis Date Noted   Encounter for Department of Transportation (DOT) examination for driving license renewal 76/72/0947   Nocturia 05/08/2013   Atherosclerosis of native coronary artery without angina pectoris    Essential hypertension    Dyslipidemia, at goal LDL below 70    Erectile dysfunction    CAD  - S/P urgent CABG x 2 for subtotal occlusion of the LAD in a stented segment that was unable to be recanalized percutaneously 04/02/2009   Past Medical History:  Diagnosis Date   CAD in native artery    Essentially LAD occlusion with in-stent restenosis/thrombosis.  50% PDA--referred for CABG x2.   Coronary stent restenosis due to progression of disease 04/2009   Subtotal occlusion of the LAD with ISR, unable to cross with wire; aneurysmal dilation of the LAD prior to stent   Current occasional smoker    He initially quit around the time of his CABG, but he has gotten back to smoking maybe 1 or 2 a day as a stress relief.  Does not smoke every day.   Dyslipidemia, goal LDL below 70    Erectile dysfunction    History of GI bleed    No recurrence   History of vertigo    Hypertension    S/P CABG x 03 April 2009    LIMA-LAD, SVG-RPDA (to bypass a roughly 50% lesion); post CABG stress normal with no ischemia   ST elevation (STEMI) myocardial infarction involving left anterior descending coronary artery [facility]) October 2005   LAD PCI with 2 overlapping Cypher DES 3.0 mm 23 mm   Past Surgical History:  Procedure Laterality Date   CARDIAC CATHETERIZATION  04/28/2009   Subtotal occlusion of mid LAD with ISR/thrombosis, unable to pass --> urgent CABG (Dr. Pecola Lawless)   COLONOSCOPY  2005   CORONARY ANGIOPLASTY WITH STENT PLACEMENT  10/26/2003   anterior ischemia with significant reversibility (mid anterior and anteroseptal, apical anterior and apical anteroseptal)  - Cypher 3x21m stent to mid LAD and Cypher 3x257mCypher DES to prox LAD(Dr. R.Gerrie Nordmann  CORONARY ARTERY BYPASS GRAFT  04/2009   LIMA-LAD, reverse SVG-distal RCA (Dr. S.Chauncey CruelHendrickson)   DOPPLER ECHOCARDIOGRAPHY  04/2009   EF 50-50%, borderline septal hypokinesis   Exercise Tolerance Test  June 20/15   Exercise 8:05 Min, 10.1 METs, reached 99% max. Peak HR -- 160 bpm; NEGATIVE Bruce TM GXT = no EKG evidence of ischemia. Prolonged heart rate and blood pressure recovery and PVCs noted that resolve with Exerction.   NM MYOVIEW LTD  05/2018   EF  45%.  Low risk.  No ischemia or infarction.   No Known Allergies Prior to Admission medications   Medication Sig Start Date End Date Taking? Authorizing Provider  acetaminophen (TYLENOL) 500 MG tablet Take 500 mg by mouth every 6 (six) hours as needed.   Yes [provider]  aspirin EC 81 MG tablet Take 81 mg by mouth daily.   Yes [provider]  benazepril (LOTENSIN) 20 MG tablet TAKE 1 TABLET BY MOUTH EVERY DAY 11/29/20  Yes Leonie Man, MD  fish oil-omega-3 fatty acids 1000 MG capsule Take 1 g by mouth 2 (two) times daily.    Yes [provider]  metoprolol succinate (TOPROL-XL) 50 MG 24 hr tablet TAKE 1 TABLET BY MOUTH EVERY DAY WITH OR IMMEDIATELY FOLLOWING A MEAL 02/09/21  Yes  Wendie Agreste, MD  RABEprazole (ACIPHEX) 20 MG tablet Take 1 tablet (20 mg total) by mouth daily. 02/09/21  Yes Wendie Agreste, MD  simvastatin (ZOCOR) 40 MG tablet TAKE 1 TABLET BY MOUTH EVERY DAY 02/25/21  Yes Leonie Man, MD   Social History   Socioeconomic History   Marital status: Divorced    Spouse name: Not on file   Number of children: 1   Years of education: 11   Highest education level: Not on file  Occupational History    Employer: [employer]  Tobacco Use   Smoking status: Some Days    Types: Cigarettes    Last attempt to quit: 10/26/2003    Years since quitting: 17.3   Smokeless tobacco: Never  Vaping Use   Vaping Use: Never used  Substance and Sexual Activity   Alcohol use: Yes    Comment: occas   Drug use: No   Sexual activity: Yes    Birth control/protection: Condom  Other Topics Concern   Not on file  Social History Narrative   Divorced father of one, grandfather 2. No longer smokes -- quit in 2005. Works for [employer] in [place]. Does not get routine activity/exercise -- Basically, he is not motivated to do any exercise. He, by himself, is admittedly lazy. Does not drink alcohol.   Social Determinants of Health   Financial Resource Strain: Not on file  Food Insecurity: Not on file  Transportation Needs: Not on file  Physical Activity: Not on file  Stress: Not on file  Social Connections: Not on file  Intimate Partner Violence: Not on file    Review of Systems   Objective:   Vitals:   03/09/21 1532  BP: 118/70  Pulse: 72  Resp: 15  Temp: 98.1 F (36.7 C)  TempSrc: Temporal  SpO2: 95%  Weight: 193 lb 6.4 oz (87.7 kg)  Height: 5' 6.5" (1.689 m)     Physical Exam Vitals reviewed.  Constitutional:      General: He is not in acute distress.    Appearance: Normal appearance. He is well-developed.  HENT:     Head: Normocephalic and atraumatic.  Cardiovascular:     Rate and Rhythm: Normal rate.   Pulmonary:     Effort: Pulmonary effort is normal.  Musculoskeletal:     Comments: C-spine, no focal bony tenderness, slight diffuse decreased range of motion but does not reproduce shoulder/arm symptoms.  Right shoulder:skin intact, no apparent swelling, erythema or ecchymosis. Active range of motion appears full but slight discomfort at terminal flexion, abduction.  Area of discomfort in anterior shoulder. [place], AC, clavicle nontender.  Slight discomfort anterior shoulder,  primarily at the bicipital groove, reproduces area of discomfort.  Negative empty can, equal RTC strength.  Negative Neer, Hawkins. Pain with Speed testing.  Again at bicipital groove.  Slight discomfort distally at biceps with testing. Bicep bulk appears to be intact without apparent significant asymmetry to left side.  No defect appreciated. Right elbow with full range of motion, no focal bony tenderness.  Slight discomfort just distal to the antecubital fossa, distal biceps.  No defect.   Neurological:     Mental Status: He is alert and oriented to person, place, and time.  Psychiatric:        Mood and Affect: Mood normal.       Assessment & Plan:  ROCKET GUNDERSON is a 66 y.o. male . Biceps tendonitis on right - Plan: DG Shoulder Right, Ambulatory referral to Sports Medicine  Chronic right shoulder pain - Plan: DG Shoulder Right, Ambulatory referral to Sports Medicine  Right elbow pain  Approximate 2-year history of right shoulder pain.  Did notice initial pain after trying to pull up into piece of work equipment.  Some continued symptoms with similar motion.  Exam suspicious for bicipital tendinitis, but with acute onset of symptoms previously, labral tear, SLAP tear possible as well.  No apparent bicep defect, less likely biceps rupture.  Some distal biceps symptoms could be in response to chronic symptoms and possible adjustment of motion/activity.  -Check imaging with x-ray initially, refer to sports  Medicine for possible musculoskeletal ultrasound to evaluate area further as well as potential ultrasound-guided injection for possible biceps tendinitis.  Plan discussed with patient, all questions answered.  Avoid NSAIDs given history of GI bleed.  No orders of the defined types were placed in this encounter.  Patient Instructions  I suspect you do have some irritation of the biceps muscle tendon as it enters the shoulder.  There could also be some underlying shoulder issues but we will initially check x-ray at [facility].  See location below.  I have also referred you to [facility] sports medicine for consideration of ultrasound and possible injection of that affected area.  Tylenol is fine for now, do not use any Advil, or Aleve with your history of gastrointestinal bleeding.  Let me know if there are questions.     Signed,   Merri Ray, MD [place] 03/09/21 4:27 PM

## 2021-03-09 NOTE — Patient Instructions (Signed)
I suspect you do have some irritation of the biceps muscle tendon as it enters the shoulder.  There could also be some underlying shoulder issues but we will initially check x-ray at [facility].  See location below.  I have also referred you to [facility] sports medicine for consideration of ultrasound and possible injection of that affected area.  Tylenol is fine for now, do not use any Advil, or Aleve with your history of gastrointestinal bleeding.  Let me know if there are questions.  ?

## 2021-03-14 ENCOUNTER — Encounter: Payer: Self-pay | Admitting: Family Medicine

## 2021-03-14 ENCOUNTER — Ambulatory Visit: Payer: Self-pay

## 2021-03-14 ENCOUNTER — Ambulatory Visit (INDEPENDENT_AMBULATORY_CARE_PROVIDER_SITE_OTHER): Payer: Medicare HMO | Admitting: Family Medicine

## 2021-03-14 ENCOUNTER — Ambulatory Visit (INDEPENDENT_AMBULATORY_CARE_PROVIDER_SITE_OTHER): Payer: Medicare HMO

## 2021-03-14 ENCOUNTER — Other Ambulatory Visit: Payer: Self-pay

## 2021-03-14 VITALS — BP 114/84 | HR 76 | Ht 66.5 in | Wt 196.4 lb

## 2021-03-14 DIAGNOSIS — M79631 Pain in right forearm: Secondary | ICD-10-CM

## 2021-03-14 DIAGNOSIS — G8929 Other chronic pain: Secondary | ICD-10-CM

## 2021-03-14 DIAGNOSIS — M25511 Pain in right shoulder: Secondary | ICD-10-CM

## 2021-03-14 DIAGNOSIS — M25521 Pain in right elbow: Secondary | ICD-10-CM

## 2021-03-14 NOTE — Patient Instructions (Addendum)
Good to see you today. ? ?You had a R shoulder injection.  Call or go to the ER if you develop a large red swollen joint with extreme pain or oozing puss.  ? ?Please perform the exercise program that we have prepared for you and gone over in detail on a daily basis.  In addition to the handout you were provided you can access your program through: [email or web address]  ? ?Your unique program code is:   [place] ? ?Please get an Xray today before you leave. ? ?Follow-up: 6 weeks ?

## 2021-03-14 NOTE — Progress Notes (Signed)
? ?I, Wendy Poet, LAT, ATC, am serving as scribe for Dr. [name]. ? ?Subjective:   ? ?CC: R shoulder/upper arm pain ? ?HPI: Pt is a 66 y/o male c/o R shoulder/upper arm pain for a few years.  Pt is R hand dominant. 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 Pt locates pain to his R ant shoulder and his R proximal ant forearm just distal to his elbow. ? ?Neck pain: no ?Radiates: no ?Aggravates: pull-up type motions; sleeping on his R side; end-range overhead L shoulder AROM ?Treatments tried: Tylenol; heat;  ? ?Pertinent review of Systems: No fevers or chills ? ?Relevant historical information: Hypertension ? ? ?Objective:   ? ?Vitals:  ? 03/14/21 1524  ?BP: 114/84  ?Pulse: 76  ?SpO2: 96%  ? ?General: Well Developed, well nourished, and in no acute distress.  ? ?MSK: Right shoulder: Normal-appearing ?Range of motion abduction full pain with abduction.  Internal rotation lumbar spine external rotation full. ?Strength intact. ?Positive Hawkins and Neer's test.  Positive empty can test.  Positive Yergason's and speeds test. ? ?Right elbow: Normal-appearing ?Tender palpation anterior elbow near the biceps tendon insertion. ?Mildly tender palpation just distal to the medial epicondyles. ?Some pain with resisted elbow flexion and some pain with resisted wrist flexion present. ?Intact strength. ? ?Lab and Radiology Results ? ?Procedure: Real-time Ultrasound Guided Injection of right shoulder subacromial bursa ?Device: Philips Affiniti 50G ?Images permanently stored and available for review in PACS ?Ultrasound evaluation prior to injection reveals a normal-appearing biceps tendon without significant hypoechoic fluid or evidence of biceps tendon tear. ?Subscapularis tendon normal. ?Supraspinatus tendon intact however moderate subacromial bursitis is present. ?Infraspinatus tendon is intact ?AC DJD present. ?Verbal informed consent obtained.   Discussed risks and benefits of procedure. Warned about infection bleeding damage to structures skin hypopigmentation and fat atrophy among others. ?Patient expresses understanding and agreement ?Time-out conducted.   ?Noted no overlying erythema, induration, or other signs of local infection.   ?Skin prepped in a sterile fashion.   ?Local anesthesia: Topical Ethyl chloride.   ?With sterile technique and under real time ultrasound guidance: 40 mg of Kenalog and 2 mL of Marcaine injected into subacromial bursa. Fluid seen entering the bursa.   ?Completed without difficulty   ?Pain immediately resolved suggesting accurate placement of the medication.   ?Advised to call if fevers/chills, erythema, induration, drainage, or persistent bleeding.   ?Images permanently stored and available for review in the ultrasound unit.  ?Impression: Technically successful ultrasound guided injection. ? ?Diagnostic Limited MSK Ultrasound of: Right elbow ?Well-appearing medial epicondyle. ?Distal biceps tendon partially visualized no obvious tear ?Impression: No severe medial epicondylitis or distal biceps tear visible. ? ? ?X-ray images right shoulder and right elbow obtained today personally and independently interpreted ? ?Right shoulder: Mild glenohumeral DJD.  Mild to moderate AC DJD.  No acute fractures. ? ?Right elbow: No severe DJD.  No acute fractures. ? ?Await formal radiology review ? ? ?Impression and Recommendations:   ? ?Assessment and Plan: ?66 y.o. male with chronic right shoulder pain and chronic right anterior elbow pain.  This has been ongoing for over a year and occurs with biceps activity and activity such as climbing a ladder. ? ?His physical exam and ultrasound examination are somewhat positive for subacromial bursitis.  He may have a more intra-articular biceps problem although the proximal biceps tendon was relatively normal appearing on ultrasound  today. ?. ?Plan to treat with subacromial injection and home  exercise program today taught by ATC.  Ideally would like to refer to PT but he thinks he does not have the time to go to PT right now.  Check back in 6 weeks. ? ?PDMP not reviewed this encounter. ?Orders Placed This Encounter  ?Procedures  ? [place] LIMITED JOINT SPACE STRUCTURES UP RIGHT(NO LINKED CHARGES)  ?  Order Specific Question:   Reason for Exam (SYMPTOM  OR DIAGNOSIS REQUIRED)  ?  Answer:   R shoulder pain  ?  Order Specific Question:   Preferred imaging location?  ?  Answer:   [place]  ? DG Shoulder Right  ?  Standing Status:   Future  ?  Standing Expiration Date:   04/14/2021  ?  Order Specific Question:   Reason for Exam (SYMPTOM  OR DIAGNOSIS REQUIRED)  ?  Answer:   R shoulder pain  ?  Order Specific Question:   Preferred imaging location?  ?  Answer:   [facility]  ? DG ELBOW COMPLETE RIGHT (3+VIEW)  ?  Standing Status:   Future  ?  Standing Expiration Date:   04/14/2021  ?  Order Specific Question:   Reason for Exam (SYMPTOM  OR DIAGNOSIS REQUIRED)  ?  Answer:   R elbow pain  ?  Order Specific Question:   Preferred imaging location?  ?  Answer:   [facility]  ? ?No orders of the defined types were placed in this encounter. ? ? ?Discussed warning signs or symptoms. Please see discharge instructions. Patient expresses understanding. ? ? ?The above documentation has been reviewed and is accurate and complete [name], M.D. ? ?

## 2021-03-17 NOTE — Progress Notes (Signed)
Right shoulder x-ray shows mild arthritis changes.

## 2021-03-17 NOTE — Progress Notes (Signed)
Right elbow xray shows minimal arthritis.

## 2021-03-30 ENCOUNTER — Ambulatory Visit: Payer: Medicare HMO

## 2021-04-06 ENCOUNTER — Ambulatory Visit: Payer: Medicare HMO | Admitting: Cardiology

## 2021-04-06 ENCOUNTER — Encounter: Payer: Self-pay | Admitting: Cardiology

## 2021-04-06 VITALS — BP 110/90 | HR 89 | Ht 66.0 in | Wt 187.2 lb

## 2021-04-06 DIAGNOSIS — E785 Hyperlipidemia, unspecified: Secondary | ICD-10-CM

## 2021-04-06 DIAGNOSIS — I10 Essential (primary) hypertension: Secondary | ICD-10-CM

## 2021-04-06 DIAGNOSIS — I2510 Atherosclerotic heart disease of native coronary artery without angina pectoris: Secondary | ICD-10-CM | POA: Diagnosis not present

## 2021-04-06 DIAGNOSIS — Z951 Presence of aortocoronary bypass graft: Secondary | ICD-10-CM

## 2021-04-06 NOTE — Progress Notes (Signed)
? ? ?Primary Care Provider: Wendie Agreste, MD ?Cardiologist: Glenetta Hew, MD ?Electrophysiologist: None ? ?Clinic Note: ?Chief Complaint  ?Patient presents with  ? Follow-up  ?  Annual; doing well.  16 pound weight loss  ? Coronary Artery Disease  ?  No angina  ? ?=================================== ? ?ASSESSMENT/PLAN  ? ?Problem List Items Addressed This Visit   ? ?  ? Cardiology Problems  ? Atherosclerosis of native coronary artery without angina pectoris - Primary (Chronic)  ?  12 years out from his CABG.  Still doing well with no active angina symptoms.  Most recent Myoview (May 2020) was low risk with no ischemia infarction.  Mildly reduced EF 45%.  He remains very active with no concerning anginal symptoms.  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  So we are therefore okay to wait until May 2020 04-2023 to reassess based on symptoms. ? ?Plan: ?Continue 81 mg aspirin along with stable dose of statin. ?Continue benazepril and Toprol at stable doses. ?I encouraged him to continue with his diet and exercise plan to lose weight. ?Continue fish oil ?  ?  ? Essential hypertension (Chronic)  ?  Well-controlled BP on current dose of ACE inhibitor and Toprol. ?  ?  ? Relevant Orders  ? EKG 12-Lead (Completed)  ? Lipid panel  ? Comprehensive metabolic panel  ? TSH  ? Hemoglobin A1c  ? CBC  ? Dyslipidemia, at goal LDL below 70 (Chronic)  ?  Labs as of last July but well controlled with an LDL of 60.  HDL was a little low, but with total cholesterol down to 119, not all that acceptable.  Continue with diet and exercise. ?Plan: Continue simvastatin at current dose. ? ?We will then recheck labs today as he is not sure when he would ask his PCP. ->  FLP, A1c, CBC and TSH as well as CMP. ?  ?  ? Relevant Orders  ? EKG 12-Lead (Completed)  ? Lipid panel  ? Comprehensive metabolic panel  ? TSH  ? Hemoglobin A1c  ? CBC  ?  ? Other  ? CAD  - S/P urgent CABG x 2 for subtotal occlusion of the LAD in a stented segment  that was unable to be recanalized percutaneously (Chronic)  ?  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  Has not had any further angina since that timeframe. ? ?Due for follow-up stress test as early as May 2024 if indicated for symptoms, otherwise would likely wait till May 2025. ?  ?  ? Relevant Orders  ? EKG 12-Lead (Completed)  ? Lipid panel  ? Comprehensive metabolic panel  ? TSH  ? Hemoglobin A1c  ? CBC  ? ? ?=================================== ? ?HPI:   ? ?[name] Benitez is a 66 y.o. male with a PMH notable for CAD (CABG x2), HTN, HLD along with a ED who presents today for annual follow-up. ? ?[name] Benitez was last seen on February 05, 2020--doing well.  25 Dr. Quay Burow standpoint.  Still active.  Close to considering retirement-hopefully before he turns 61.  He noted that sildenafil is helping his ED.  No other cardiac symptoms. ? ?Recent Hospitalizations: None ? ?Reviewed  CV studies:   ? ?The following studies were reviewed today: (if available, images/films reviewed: From Epic Chart or Care Everywhere) ?None: ? ?Interval History:  ? ?[name] Benitez returns here today for annual follow-up doing well.  He is very happy to announce that  he is down about 16 pounds over the last year.  He weighed 203 pounds before working on weight loss elevated 187 pounds today.  He notes improved overall energy levels.  He remains active although is not exercising routinely.  Planning on retiring near the end of this year.  Will likely be moving to the [place] area. ? ?CV Review of Symptoms (Summary) ?Cardiovascular ROS: no chest pain or dyspnea on exertion ?positive for -  Still notes some mild exercise intolerance, better than before. ?negative for - edema, irregular heartbeat, orthopnea, palpitations, paroxysmal nocturnal dyspnea, rapid heart rate, shortness of breath, or lightheadedness, dizziness or wooziness, syncope/near syncope or TIA/amaurosis fugax, claudication ? ?REVIEWED  OF SYSTEMS  ? ?CV symptoms noted above-otherwise positive for: ?Smoker's cough in the morning. ?Off-and-on joint pains. ? ?Remainder of ROS negative ? ?I have reviewed and (if needed) personally updated the patient's problem list, medications, allergies, past medical and surgical history, social and family history.  ? ?PAST MEDICAL HISTORY  ? ?Past Medical History:  ?Diagnosis Date  ? CAD in native artery   ? Essentially LAD occlusion with in-stent restenosis/thrombosis.  50% PDA--referred for CABG x2.  ? Coronary stent restenosis due to progression of disease 04/2009  ? Subtotal occlusion of the LAD with ISR, unable to cross with wire; aneurysmal dilation of the LAD prior to stent  ? Current occasional smoker   ? He initially quit around the time of his CABG, but he has gotten back to smoking maybe 1 or 2 a day as a stress relief.  Does not smoke every day.  ? Dyslipidemia, goal LDL below 70   ? Erectile dysfunction   ? History of GI bleed   ? No recurrence  ? History of vertigo   ? Hypertension   ? S/P CABG x 03 April 2009  ? LIMA-LAD, SVG-RPDA (to bypass a roughly 50% lesion); post CABG stress normal with no ischemia  ? ST elevation (STEMI) myocardial infarction involving left anterior descending coronary artery [facility]) October 2005  ? LAD PCI with 2 overlapping Cypher DES 3.0 mm 23 mm  ? ? ?PAST SURGICAL HISTORY  ? ?Past Surgical History:  ?Procedure Laterality Date  ? CARDIAC CATHETERIZATION  04/28/2009  ? Subtotal occlusion of mid LAD with ISR/thrombosis, unable to pass --> urgent CABG (Dr. Pecola Lawless)  ? COLONOSCOPY  2005  ? CORONARY ANGIOPLASTY WITH STENT PLACEMENT  10/26/2003  ? anterior ischemia with significant reversibility (mid anterior and anteroseptal, apical anterior and apical anteroseptal)  - Cypher 3x47m stent to mid LAD and Cypher 3x274mCypher DES to prox LAD(Dr. R.Gerrie Nordmann ? CORONARY ARTERY BYPASS GRAFT  04/2009  ? LIMA-LAD, reverse SVG-distal RCA (Dr. S.Chauncey CruelHendrickson)  ? DOPPLER ECHOCARDIOGRAPHY   04/2009  ? EF 50-50%, borderline septal hypokinesis  ? Exercise Tolerance Test  June 20/15  ? Exercise 8:05 Min, 10.1 METs, reached 99% max. Peak HR -- 160 bpm; NEGATIVE Bruce TM GXT = no EKG evidence of ischemia. Prolonged heart rate and blood pressure recovery and PVCs noted that resolve with Exerction.  ? NM MYOVIEW LTD  05/2018  ? EF 45%.  Low risk.  No ischemia or infarction.  ? ? ?Immunization History  ?Administered Date(s) Administered  ? Influenza Inj Mdck Quad Pf 10/03/2017  ? Influenza,inj,Quad PF,6+ Mos 09/21/2018, 10/03/2019  ? Influenza-Unspecified 12/01/2020  ? Moderna Sars-Covid-2 Vaccination 03/29/2019, 04/26/2019  ? PFIZER(Purple Top)SARS-COV-2 Vaccination 11/07/2019  ? Tdap 11/17/2019  ? ? ?MEDICATIONS/ALLERGIES  ? ?Current Meds  ?Medication Sig  ?  acetaminophen (TYLENOL) 500 MG tablet Take 500 mg by mouth 2 (two) times daily.  ? aspirin EC 81 MG tablet Take 81 mg by mouth daily.  ? benazepril (LOTENSIN) 20 MG tablet TAKE 1 TABLET BY MOUTH EVERY DAY  ? Doxylamine Succinate, Sleep, (SLEEP AID PO) Take by mouth daily. 2 tablets at bedtime.  ? fish oil-omega-3 fatty acids 1000 MG capsule Take 1 g by mouth 2 (two) times daily.   ? metoprolol succinate (TOPROL-XL) 50 MG 24 hr tablet TAKE 1 TABLET BY MOUTH EVERY DAY WITH OR IMMEDIATELY FOLLOWING A MEAL  ? RABEprazole (ACIPHEX) 20 MG tablet Take 1 tablet (20 mg total) by mouth daily.  ? simvastatin (ZOCOR) 40 MG tablet TAKE 1 TABLET BY MOUTH EVERY DAY  ? ? ?No Known Allergies ? ?SOCIAL HISTORY/FAMILY HISTORY  ? ?Reviewed in Epic:  ?Pertinent findings:  ?Social History  ? ?Tobacco Use  ? Smoking status: Every Day  ?  Types: Cigarettes  ?  Last attempt to quit: 10/26/2003  ?  Years since quitting: 17.4  ? Smokeless tobacco: Never  ? Tobacco comments:  ?  12 Cigarettes daily   ?Vaping Use  ? Vaping Use: Never used  ?Substance Use Topics  ? Alcohol use: Yes  ?  Comment: occas  ? Drug use: No  ? ?Social History  ? ?Social History Narrative  ? Divorced father of  one, grandfather 2. No longer smokes -- quit in 2005. Works for [employer] in [place]. Does not get routine activity/exercise -- Basically, he is not motivated to do any exercise. He, by

## 2021-04-06 NOTE — Patient Instructions (Addendum)
Medication Instructions:  ?NO CHANGES  ? ?*If you need a refill on your cardiac medications before your next appointment, please call your pharmacy* ? ? ?Lab Work: FASTING ?CBC ?CMP ?LIPID ?TSH ?HGBA1C ?If you have labs (blood work) drawn today and your tests are completely normal, you will receive your results only by: ?MyChart Message (if you have MyChart) OR ?A paper copy in the mail ?If you have any lab test that is abnormal or we need to change your treatment, we will call you to review the results. ? ? ?Testing/Procedures: ? ?Not needed ? ?Follow-Up: ?At [facility] you and your health needs are our priority.  As part of our continuing mission to provide you with exceptional heart care, we have created designated Provider Care Teams.  These Care Teams include your primary Cardiologist (physician) and Advanced Practice Providers (APPs -  Physician Assistants and Nurse Practitioners) who all work together to provide you with the care you need, when you need it. ? ?  ? ?Your next appointment:   ?12 month(s) ? ?The format for your next appointment:   ?In Person ? ?Provider:   ?None  ? ? ?Other Instructions  ?

## 2021-04-14 ENCOUNTER — Encounter: Payer: Self-pay | Admitting: Cardiology

## 2021-04-14 NOTE — Assessment & Plan Note (Signed)
Well-controlled BP on current dose of ACE inhibitor and Toprol. ?

## 2021-04-14 NOTE — Assessment & Plan Note (Addendum)
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  Has not had any further angina since that timeframe. ? ?Due for follow-up stress test as early as May 2024 if indicated for symptoms, otherwise would likely wait till May 2025. ?

## 2021-04-14 NOTE — Assessment & Plan Note (Addendum)
12 years out from his CABG.  Still doing well with no active angina symptoms.  Most recent Myoview (May 2020) was low risk with no ischemia infarction.  Mildly reduced EF 45%.  He remains very active with no concerning anginal symptoms.  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  So we are therefore okay to wait until May 2020 04-2023 to reassess based on symptoms. ? ?Plan: ?? Continue 81 mg aspirin along with stable dose of statin. ?? Continue benazepril and Toprol at stable doses. ?? I encouraged him to continue with his diet and exercise plan to lose weight. ?? Continue fish oil ?

## 2021-04-14 NOTE — Assessment & Plan Note (Signed)
Labs as of last July but well controlled with an LDL of 60.  HDL was a little low, but with total cholesterol down to 119, not all that acceptable.  Continue with diet and exercise. ?Plan: Continue simvastatin at current dose. ? ?We will then recheck labs today as he is not sure when he would ask his PCP. ->  FLP, A1c, CBC and TSH as well as CMP. ?

## 2021-04-25 ENCOUNTER — Ambulatory Visit (INDEPENDENT_AMBULATORY_CARE_PROVIDER_SITE_OTHER): Payer: Medicare HMO | Admitting: Family Medicine

## 2021-04-25 VITALS — BP 130/88 | HR 81 | Ht 66.0 in | Wt 186.0 lb

## 2021-04-25 DIAGNOSIS — G8929 Other chronic pain: Secondary | ICD-10-CM

## 2021-04-25 DIAGNOSIS — M25511 Pain in right shoulder: Secondary | ICD-10-CM

## 2021-04-25 NOTE — Patient Instructions (Addendum)
Thank you for coming in today.  ? ?Continue working on the exercises at home ? ?Recheck back as needed ? ? ?

## 2021-04-25 NOTE — Progress Notes (Signed)
? ?  I, [name] Benitez, LAT, ATC acting as a scribe for [name], MD. ? ?[name] Benitez is a 66 y.o. male who presents to [place] [facility] today for f/u R shoulder/upper arm pain. Pt was last seen by Dr. Georgina Snell on 03/14/21 and was given a R subacromial steroid injection and was taught HEP. Today, pt reports much benefit from prior steroid injection. Pt notes only slight tenderness is just starting to return. Pt has been working on [employer].  ? ?Dx imaging: 03/14/21 R shoulder & R elbow XR ? ?Pertinent review of systems: No fevers or chills ? ?Relevant historical information: Dyslipidemia.  Hypertension. ? ? ?Exam:  ?BP 130/88   Pulse 81   Ht '5\' 6"'$  (1.676 m)   Wt 186 lb (84.4 kg)   SpO2 94%   BMI 30.02 kg/m?  ?General: Well Developed, well nourished, and in no acute distress.  ? ?MSK: Right shoulder normal-appearing normal motion intact strength negative Hawkins and Neer's test.  Negative Yergason's and speeds test. ? ? ? ?Lab and Radiology Results ? ?EXAM: ?RIGHT SHOULDER - 2+ VIEW ?  ?COMPARISON:  None. ?  ?FINDINGS: ?Minimal inferior glenoid degenerative osteophytosis. ?  ?Mild acromioclavicular joint space narrowing and peripheral ?osteophytosis. Mild distal lateral subacromial spurring. ?  ?Status post median sternotomy. ?  ?IMPRESSION: ?Mild acromioclavicular osteoarthritis. ?  ?  ?Electronically Signed ?  By: Yvonne Kendall M.D. ?  On: 03/15/2021 21:17 ?I, [name], personally (independently) visualized and performed the interpretation of the images attached in this note. ? ? ? ? ? ?Assessment and Plan: ?66 y.o. male with right shoulder pain.  Improved with subacromial injection and home exercise program on March 13.  Plan to continue home exercise program and check back with me as needed.  Can repeat the injection on June 13 or later.  Could also refer to formal physical therapy if needed.  Discussed precautions and check back indications.  Return as needed. ? ?Total encounter  time 20 minutes including face-to-face time with the patient and, reviewing past medical record, and charting on the date of service.   ? ? ? ?Discussed warnin R g signs or symptoms. Please see discharge instructions. Patient expresses understanding. ? ? ?The above documentation has been reviewed and is accurate and complete [name], M.D. ? ? ?

## 2021-04-28 DIAGNOSIS — I10 Essential (primary) hypertension: Secondary | ICD-10-CM | POA: Diagnosis not present

## 2021-04-28 DIAGNOSIS — E785 Hyperlipidemia, unspecified: Secondary | ICD-10-CM | POA: Diagnosis not present

## 2021-04-28 DIAGNOSIS — Z951 Presence of aortocoronary bypass graft: Secondary | ICD-10-CM | POA: Diagnosis not present

## 2021-04-28 DIAGNOSIS — I2510 Atherosclerotic heart disease of native coronary artery without angina pectoris: Secondary | ICD-10-CM | POA: Diagnosis not present

## 2021-04-28 LAB — LIPID PANEL
Chol/HDL Ratio: 3.7 ratio (ref 0.0–5.0)
Cholesterol, Total: 130 mg/dL (ref 100–199)
HDL: 35 mg/dL — ABNORMAL LOW (ref >39)
LDL Chol Calc (NIH): 67 mg/dL (ref 0–99)
Triglycerides: 161 mg/dL — ABNORMAL HIGH (ref 0–149)
VLDL Cholesterol Cal: 28 mg/dL (ref 5–40)

## 2021-04-28 LAB — COMPREHENSIVE METABOLIC PANEL
ALT: 25 IU/L (ref 0–44)
AST: 26 IU/L (ref 0–40)
Albumin/Globulin Ratio: 1.9 (ref 1.2–2.2)
Albumin: 4.6 g/dL (ref 3.8–4.8)
Alkaline Phosphatase: 77 IU/L (ref 44–121)
BUN/Creatinine Ratio: 12 (ref 10–24)
BUN: 13 mg/dL (ref 8–27)
Bilirubin Total: 0.9 mg/dL (ref 0.0–1.2)
CO2: 21 mmol/L (ref 20–29)
Calcium: 10 mg/dL (ref 8.6–10.2)
Chloride: 100 mmol/L (ref 96–106)
Creatinine, Ser: 1.1 mg/dL (ref 0.76–1.27)
Globulin, Total: 2.4 g/dL (ref 1.5–4.5)
Glucose: 81 mg/dL (ref 70–99)
Potassium: 4.9 mmol/L (ref 3.5–5.2)
Sodium: 137 mmol/L (ref 134–144)
Total Protein: 7 g/dL (ref 6.0–8.5)
eGFR: 74 mL/min/{1.73_m2} (ref >59)

## 2021-04-28 LAB — HEMOGLOBIN A1C
Est. average glucose Bld gHb Est-mCnc: 123 mg/dL
Hgb A1c MFr Bld: 5.9 % — ABNORMAL HIGH (ref 4.8–5.6)

## 2021-04-28 LAB — CBC
Hematocrit: 56.2 % — ABNORMAL HIGH (ref 37.5–51.0)
Hemoglobin: 18.7 g/dL — ABNORMAL HIGH (ref 13.0–17.7)
MCH: 28.9 pg (ref 26.6–33.0)
MCHC: 33.3 g/dL (ref 31.5–35.7)
MCV: 87 fL (ref 79–97)
Platelets: 232 10*3/uL (ref 150–450)
RBC: 6.47 x10E6/uL — ABNORMAL HIGH (ref 4.14–5.80)
RDW: 13.8 % (ref 11.6–15.4)
WBC: 7 10*3/uL (ref 3.4–10.8)

## 2021-04-28 LAB — TSH: TSH: 0.876 u[IU]/mL (ref 0.450–4.500)

## 2021-05-02 ENCOUNTER — Telehealth: Payer: Self-pay | Admitting: *Deleted

## 2021-05-02 DIAGNOSIS — H2513 Age-related nuclear cataract, bilateral: Secondary | ICD-10-CM | POA: Diagnosis not present

## 2021-05-02 DIAGNOSIS — I10 Essential (primary) hypertension: Secondary | ICD-10-CM | POA: Diagnosis not present

## 2021-05-02 DIAGNOSIS — H2511 Age-related nuclear cataract, right eye: Secondary | ICD-10-CM | POA: Diagnosis not present

## 2021-05-02 NOTE — Telephone Encounter (Signed)
? ?  Patient Name: [name] Benitez  ?DOB: May 20, 1955 ?MRN: [id number] ? ?Primary Cardiologist: Glenetta Hew, MD ? ?Chart reviewed as part of pre-operative protocol coverage. Cataract extractions are recognized in guidelines as low risk surgeries that do not typically require specific preoperative testing or holding of blood thinner therapy. Therefore, given past medical history and time since last visit, based on ACC/AHA guidelines, [name] Benitez would be at acceptable risk for the planned procedure without further cardiovascular testing.  ? ?I will route this recommendation to the requesting party via Epic fax function and remove from pre-op pool. ? ?Please call with questions. ? ?Charlie Pitter, PA-C ?05/02/2021, 1:12 PM ? ?

## 2021-05-02 NOTE — Telephone Encounter (Signed)
? ?  Pre-operative Risk Assessment  ?  ?Patient Name: [name] Benitez  ?DOB: [birth year] ?MRN: [id number]  ? ?  ? ?Request for Surgical Clearance   ? ?Procedure:   CATARACT EXTRACTION WITH INTRAOCULAR LENS IMPLANT OF THE RIGHT EYE ? ?Date of Surgery:  Clearance 06/09/21                              ?   ?Surgeon:  DR. LISA SUN ?Surgeon's Group or Practice Name:  [facility] ?Phone number:  [phone] ?Fax number:  [phone] ATTN: Noel Journey ?  ?Type of Clearance Requested:   ?- Medical  (PER CLEARANCE REQUEST PT DOES NOT NEED TO STOP ANY MEDICATIONS, INCLUDING ANY BLOOD THINNERS) ?  ?Type of Anesthesia:   TOPICAL ANESTHESIA WITH IV MEDICATION ?  ?Additional requests/questions:   ? ?Signed, ?Julaine Hua   ?05/02/2021, 11:25 AM  ? ?

## 2021-05-05 DIAGNOSIS — H524 Presbyopia: Secondary | ICD-10-CM | POA: Diagnosis not present

## 2021-05-26 ENCOUNTER — Ambulatory Visit (INDEPENDENT_AMBULATORY_CARE_PROVIDER_SITE_OTHER): Payer: Medicare HMO

## 2021-05-26 VITALS — Wt 185.0 lb

## 2021-05-26 DIAGNOSIS — Z0000 Encounter for general adult medical examination without abnormal findings: Secondary | ICD-10-CM | POA: Diagnosis not present

## 2021-05-26 NOTE — Patient Instructions (Signed)
[name] Benitez , Thank you for taking time to come for your Medicare Wellness Visit. I appreciate your ongoing commitment to your health goals. Please review the following plan we discussed and let me know if I can assist you in the future.   Screening recommendations/referrals: Colonoscopy: Done 12/25/2018 - This was supposed to be repeated in 2021 - Make appointment soon Recommended yearly ophthalmology/optometry visit for glaucoma screening and checkup Recommended yearly dental visit for hygiene and checkup  Vaccinations: Influenza vaccine: Done 12/01/2020 - Repeat annually Pneumococcal vaccine: Due - consider once per lifetime Prevnar-20 Tdap vaccine: Done 11/17/2019 - Repeat in 10 years Shingles vaccine: Due - Shingrix is 2 doses 2-6 months apart and over 90% effective     Covid-19: Done 03/29/2019, 04/26/2019, 11/07/2019, & 02/25/2021  Advanced directives: Advance directive discussed with you today. Even though you declined this today, please call our office should you change your mind, and we can give you the proper paperwork for you to fill out.   Conditions/risks identified: Aim for 30 minutes of exercise or brisk walking, 6-8 glasses of water, and 5 servings of fruits and vegetables each day.   Next appointment: Follow up in one year for your annual wellness visit.   Preventive Care 81 Years and Older, Male  Preventive care refers to lifestyle choices and visits with your health care provider that can promote health and wellness. What does preventive care include? A yearly physical exam. This is also called an annual well check. Dental exams once or twice a year. Routine eye exams. Ask your health care provider how often you should have your eyes checked. Personal lifestyle choices, including: Daily care of your teeth and gums. Regular physical activity. Eating a healthy diet. Avoiding tobacco and drug use. Limiting alcohol use. Practicing safe sex. Taking low doses of  aspirin every day. Taking vitamin and mineral supplements as recommended by your health care provider. What happens during an annual well check? The services and screenings done by your health care provider during your annual well check will depend on your age, overall health, lifestyle risk factors, and family history of disease. Counseling  Your health care provider may ask you questions about your: Alcohol use. Tobacco use. Drug use. Emotional well-being. Home and relationship well-being. Sexual activity. Eating habits. History of falls. Memory and ability to understand (cognition). Work and work Statistician. Screening  You may have the following tests or measurements: Height, weight, and BMI. Blood pressure. Lipid and cholesterol levels. These may be checked every 5 years, or more frequently if you are over 29 years old. Skin check. Lung cancer screening. You may have this screening every year starting at age 64 if you have a 30-pack-year history of smoking and currently smoke or have quit within the past 15 years. Fecal occult blood test (FOBT) of the stool. You may have this test every year starting at age 73. Flexible sigmoidoscopy or colonoscopy. You may have a sigmoidoscopy every 5 years or a colonoscopy every 10 years starting at age 56. Prostate cancer screening. Recommendations will vary depending on your family history and other risks. Hepatitis C blood test. Hepatitis B blood test. Sexually transmitted disease (STD) testing. Diabetes screening. This is done by checking your blood sugar (glucose) after you have not eaten for a while (fasting). You may have this done every 1-3 years. Abdominal aortic aneurysm (AAA) screening. You may need this if you are a current or former smoker. Osteoporosis. You may be screened starting at age 60  if you are at high risk. Talk with your health care provider about your test results, treatment options, and if necessary, the need for more  tests. Vaccines  Your health care provider may recommend certain vaccines, such as: Influenza vaccine. This is recommended every year. Tetanus, diphtheria, and acellular pertussis (Tdap, Td) vaccine. You may need a Td booster every 10 years. Zoster vaccine. You may need this after age 75. Pneumococcal 13-valent conjugate (PCV13) vaccine. One dose is recommended after age 92. Pneumococcal polysaccharide (PPSV23) vaccine. One dose is recommended after age 27. Talk to your health care provider about which screenings and vaccines you need and how often you need them. This information is not intended to replace advice given to you by your health care provider. Make sure you discuss any questions you have with your health care provider. Document Released: 01/15/2015 Document Revised: 09/08/2015 Document Reviewed: 10/20/2014 Elsevier Interactive Patient Education  2017 [place] Prevention in the Home Falls can cause injuries. They can happen to people of all ages. There are many things you can do to make your home safe and to help prevent falls. What can I do on the outside of my home? Regularly fix the edges of walkways and driveways and fix any cracks. Remove anything that might make you trip as you walk through a door, such as a raised step or threshold. Trim any bushes or trees on the path to your home. Use bright outdoor lighting. Clear any walking paths of anything that might make someone trip, such as rocks or tools. Regularly check to see if handrails are loose or broken. Make sure that both sides of any steps have handrails. Any raised decks and porches should have guardrails on the edges. Have any leaves, snow, or ice cleared regularly. Use sand or salt on walking paths during winter. Clean up any spills in your garage right away. This includes oil or grease spills. What can I do in the bathroom? Use night lights. Install grab bars by the toilet and in the tub and shower.  Do not use towel bars as grab bars. Use non-skid mats or decals in the tub or shower. If you need to sit down in the shower, use a plastic, non-slip stool. Keep the floor dry. Clean up any water that spills on the floor as soon as it happens. Remove soap buildup in the tub or shower regularly. Attach bath mats securely with double-sided non-slip rug tape. Do not have throw rugs and other things on the floor that can make you trip. What can I do in the bedroom? Use night lights. Make sure that you have a light by your bed that is easy to reach. Do not use any sheets or blankets that are too big for your bed. They should not hang down onto the floor. Have a firm chair that has side arms. You can use this for support while you get dressed. Do not have throw rugs and other things on the floor that can make you trip. What can I do in the kitchen? Clean up any spills right away. Avoid walking on wet floors. Keep items that you use a lot in easy-to-reach places. If you need to reach something above you, use a strong step stool that has a grab bar. Keep electrical cords out of the way. Do not use floor polish or wax that makes floors slippery. If you must use wax, use non-skid floor wax. Do not have throw rugs and other things  on the floor that can make you trip. What can I do with my stairs? Do not leave any items on the stairs. Make sure that there are handrails on both sides of the stairs and use them. Fix handrails that are broken or loose. Make sure that handrails are as long as the stairways. Check any carpeting to make sure that it is firmly attached to the stairs. Fix any carpet that is loose or worn. Avoid having throw rugs at the top or bottom of the stairs. If you do have throw rugs, attach them to the floor with carpet tape. Make sure that you have a light switch at the top of the stairs and the bottom of the stairs. If you do not have them, ask someone to add them for you. What else  can I do to help prevent falls? Wear shoes that: Do not have high heels. Have rubber bottoms. Are comfortable and fit you well. Are closed at the toe. Do not wear sandals. If you use a stepladder: Make sure that it is fully opened. Do not climb a closed stepladder. Make sure that both sides of the stepladder are locked into place. Ask someone to hold it for you, if possible. Clearly mark and make sure that you can see: Any grab bars or handrails. First and last steps. Where the edge of each step is. Use tools that help you move around (mobility aids) if they are needed. These include: Canes. Walkers. Scooters. Crutches. Turn on the lights when you go into a dark area. Replace any light bulbs as soon as they burn out. Set up your furniture so you have a clear path. Avoid moving your furniture around. If any of your floors are uneven, fix them. If there are any pets around you, be aware of where they are. Review your medicines with your doctor. Some medicines can make you feel dizzy. This can increase your chance of falling. Ask your doctor what other things that you can do to help prevent falls. This information is not intended to replace advice given to you by your health care provider. Make sure you discuss any questions you have with your health care provider. Document Released: 10/15/2008 Document Revised: 05/27/2015 Document Reviewed: 01/23/2014 Elsevier Interactive Patient Education  2017 [employer]   Prostatitis  Prostatitis is swelling of the prostate gland, also called the prostate. This gland is about 1.5 inches wide and 1 inch high, and it helps to make a fluid called semen. The prostate is below a man's bladder, in front of the butt (rectum). There are different types of prostatitis. What are the causes? One type of prostatitis is caused by an infection from germs (bacteria). Another type is not caused by germs. It may be caused by: Things having to do with the  nervous system. This system includes thebrain, spinal cord, and nerves. An autoimmune response. This happens when the body's disease-fighting system attacks healthy tissue in the body by mistake. Psychological factors. These have to do with how the mind works. The causes of other types of prostatitis are normally not known. What are the signs or symptoms? Symptoms of this condition depend on the type of prostatitis you have. If your condition is caused by germs: You may feel pain or burning when you pee (urinate). You may pee often and all of a sudden. You may have problems starting to pee. You may have trouble emptying your bladder when you pee. You may have fever or chills. You  may feel pain in your muscles, joints, low back, or lower belly. If you have other types of prostatitis: You may pee often or all of a sudden. You may have trouble starting to pee. You may have a weak flow when you pee. You may leak pee after using the bathroom. You may have other problems, such as: Abnormal fluid coming from the penis. Pain in the testicles or penis. Pain between the butt and the testicles. Pain when fluid comes out of the penis during sex. How is this treated? Treatment for this condition depends on the type of prostatitis. Treatment may include: Medicines. These may treat pain or swelling, or they may help relax muscles. Exercises to help you move better or get stronger (physical therapy). Heat therapy. Techniques to help you control some of the ways that your body works. Exercises to help you relax. Antibiotic medicine, if your condition is caused by germs. Warm water baths (sitz baths) to relax muscles. Follow these instructions at home: Medicines Take over-the-counter and prescription medicines only as told by your doctor. If you were prescribed an antibiotic medicine, take it as told by your doctor. Do not stop using the antibiotic even if you start to feel better. Managing pain  and swelling  Take sitz baths as told by your doctor. For a sitz bath, sit in warm water that is deep enough to cover your hips and butt. If told, put heat on the painful area. Do this as often as told by your doctor. Use the heat source that your doctor recommends, such as a moist heat pack or a heating pad. Place a towel between your skin and the heat source. Leave the heat on for 20-30 minutes. Take off the heat if your skin turns bright red. This is very important if you are unable to feel pain, heat, or cold. You may have a greater risk of getting burned. General instructions Do exercises as told by your doctor, if your doctor prescribed them. Keep all follow-up visits as told by your doctor. This is important. Where to find more information [employer] of Diabetes and Digestive and Kidney Diseases: http://www.bass.com/ Contact a doctor if: Your symptoms get worse. You have a fever. Get help right away if: You have chills. You feel light-headed. You feel like you may faint. You cannot pee. You have blood or clumps of blood (blood clots) in your pee. Summary Prostatitis is swelling of the prostate gland. There are different types of prostatitis. Treatment depends on the type that you have. Take over-the-counter and prescription medicines only as told by your doctor. Get help right away of you have chills, feel light-headed, or feel like you may faint. Also get help right away if you cannot pee or you have blood or clumps of blood in your pee. This information is not intended to replace advice given to you by your health care provider. Make sure you discuss any questions you have with your health care provider. Document Revised: 01/24/2019 Document Reviewed: 01/24/2019 Elsevier Patient Education  [place].

## 2021-05-26 NOTE — Progress Notes (Signed)
Subjective:   [name] Benitez is a 66 y.o. male who presents for Medicare Annual/Subsequent preventive examination.  Virtual Visit via Telephone Note  I connected with  [name] Benitez on 05/26/21 at  8:15 AM EDT by telephone and verified that I am speaking with the correct person using two identifiers.  Location: Patient: Home Provider: [place] Persons participating in the virtual visit: patient/Nurse Health Advisor   I discussed the limitations, risks, security and privacy concerns of performing an evaluation and management service by telephone and the availability of in person appointments. The patient expressed understanding and agreed to proceed.  Interactive audio and video telecommunications were attempted between this nurse and patient, however failed, due to patient having technical difficulties OR patient did not have access to video capability.  We continued and completed visit with audio only.  Some vital signs may be absent or patient reported.   [name] Benitez [name] Dillyn Joaquin, LPN   Review of Systems     Cardiac Risk Factors include: advanced age (>30mn, >>24women);hypertension;male gender;dyslipidemia;smoking/ tobacco exposure;sedentary lifestyle;Other (see comment), Risk factor comments: CAD, atherosclerosis     Objective:    Today's Vitals   05/26/21 0819  Weight: 185 lb (83.9 kg)   Body mass index is 29.86 kg/m.     05/26/2021    8:30 AM 12/08/2015   11:52 AM  Advanced Directives  Does Patient Have a Medical Advance Directive? No No  Would patient like information on creating a medical advance directive? No - Patient declined     Current Medications (verified) Outpatient Encounter Medications as of 05/26/2021  Medication Sig   acetaminophen (TYLENOL) 500 MG tablet Take 500 mg by mouth 2 (two) times daily.   aspirin EC 81 MG tablet Take 81 mg by mouth daily.   benazepril (LOTENSIN) 20 MG tablet TAKE 1 TABLET BY MOUTH EVERY DAY   Doxylamine  Succinate, Sleep, (SLEEP AID PO) Take by mouth daily. 2 tablets at bedtime.   fish oil-omega-3 fatty acids 1000 MG capsule Take 1 g by mouth 2 (two) times daily.    metoprolol succinate (TOPROL-XL) 50 MG 24 hr tablet TAKE 1 TABLET BY MOUTH EVERY DAY WITH OR IMMEDIATELY FOLLOWING A MEAL   RABEprazole (ACIPHEX) 20 MG tablet Take 1 tablet (20 mg total) by mouth daily.   simvastatin (ZOCOR) 40 MG tablet TAKE 1 TABLET BY MOUTH EVERY DAY   Difluprednate 0.05 % EMUL Apply to eye. (Patient not taking: Reported on 05/26/2021)   ketorolac (ACULAR) 0.5 % ophthalmic solution SMARTSIG:In Eye(s) (Patient not taking: Reported on 05/26/2021)   moxifloxacin (VIGAMOX) 0.5 % ophthalmic solution Apply to eye. (Patient not taking: Reported on 05/26/2021)   No facility-administered encounter medications on file as of 05/26/2021.    Allergies (verified) Patient has no known allergies.   History: Past Medical History:  Diagnosis Date   CAD in native artery    Essentially LAD occlusion with in-stent restenosis/thrombosis.  50% PDA--referred for CABG x2.   Coronary stent restenosis due to progression of disease 04/2009   Subtotal occlusion of the LAD with ISR, unable to cross with wire; aneurysmal dilation of the LAD prior to stent   Current occasional smoker    He initially quit around the time of his CABG, but he has gotten back to smoking maybe 1 or 2 a day as a stress relief.  Does not smoke every day.   Dyslipidemia, goal LDL below 70    Erectile dysfunction    History of GI  bleed    No recurrence   History of vertigo    Hypertension    S/P CABG x 03 April 2009   LIMA-LAD, SVG-RPDA (to bypass a roughly 50% lesion); post CABG stress normal with no ischemia   ST elevation (STEMI) myocardial infarction involving left anterior descending coronary artery [facility]) October 2005   LAD PCI with 2 overlapping Cypher DES 3.0 mm 23 mm   Past Surgical History:  Procedure Laterality Date   CARDIAC CATHETERIZATION   04/28/2009   Subtotal occlusion of mid LAD with ISR/thrombosis, unable to pass --> urgent CABG (Dr. Pecola Lawless)   COLONOSCOPY  2005   CORONARY ANGIOPLASTY WITH STENT PLACEMENT  10/26/2003   anterior ischemia with significant reversibility (mid anterior and anteroseptal, apical anterior and apical anteroseptal)  - Cypher 3x12m stent to mid LAD and Cypher 3x249mCypher DES to prox LAD(Dr. R.Gerrie Nordmann  CORONARY ARTERY BYPASS GRAFT  04/2009   LIMA-LAD, reverse SVG-distal RCA (Dr. S.Chauncey CruelHendrickson)   DOPPLER ECHOCARDIOGRAPHY  04/2009   EF 50-50%, borderline septal hypokinesis   Exercise Tolerance Test  June 20/15   Exercise 8:05 Min, 10.1 METs, reached 99% max. Peak HR -- 160 bpm; NEGATIVE Bruce TM GXT = no EKG evidence of ischemia. Prolonged heart rate and blood pressure recovery and PVCs noted that resolve with Exerction.   NM MYOVIEW LTD  05/2018   EF 45%.  Low risk.  No ischemia or infarction.   Family History  Problem Relation Age of Onset   Hyperlipidemia Father    Cancer Maternal Grandmother    Cancer Paternal Grandfather    Colon cancer Neg Hx    Colon polyps Neg Hx    Esophageal cancer Neg Hx    Rectal cancer Neg Hx    Stomach cancer Neg Hx    Social History   Socioeconomic History   Marital status: Divorced    Spouse name: Not on file   Number of children: 1   Years of education: 11   Highest education level: Not on file  Occupational History    Employer: [employer]  Tobacco Use   Smoking status: Every Day    Types: Cigarettes    Last attempt to quit: 10/26/2003    Years since quitting: 17.5   Smokeless tobacco: Never   Tobacco comments:    12 Cigarettes daily   Vaping Use   Vaping Use: Never used  Substance and Sexual Activity   Alcohol use: Yes    Comment: occas   Drug use: No   Sexual activity: Yes    Birth control/protection: Condom  Other Topics Concern   Not on file  Social History Narrative   Divorced father of one, grandfather 2. No longer  smokes -- quit in 2005. Works for [employer] not get routine activity/exercise -- Basically, he is not motivated to do any exercise. He, by himself, is admittedly lazy. Does not drink alcohol.   Social Determinants of Health   Financial Resource Strain: Low Risk    Difficulty of Paying Living Expenses: Not hard at all  Food Insecurity: No Food Insecurity   Worried About RuCharity fundraisern the Last Year: Never true   [place] the Last Year: Never true  Transportation Needs: No Transportation Needs   Lack of Transportation (Medical): No   Lack of Transportation (Non-Medical): No  Physical Activity: Insufficiently Active   Days of Exercise per Week: 5 days  Minutes of Exercise per Session: 20 min  Stress: No Stress Concern Present   Feeling of Stress : Not at all  Social Connections: Socially Isolated   Frequency of Communication with Friends and Family: More than three times a week   Frequency of Social Gatherings with Friends and Family: Twice a week   Attends Religious Services: Never   Marine scientist or Organizations: No   Attends Music therapist: Never   Marital Status: Divorced    Tobacco Counseling Ready to quit: Not Answered Counseling given: Not Answered Tobacco comments: 12 Cigarettes daily    Clinical Intake:  Pre-visit preparation completed: Yes  Pain : No/denies pain     BMI - recorded: 29.86 Nutritional Status: BMI 25 -29 Overweight Nutritional Risks: None Diabetes: No  How often do you need to have someone help you when you read instructions, pamphlets, or other written materials from your doctor or pharmacy?: 1 - Never  Diabetic? no  Interpreter Needed?: No  Information entered by :: Aino Heckert, LPN   Activities of Daily Living    05/26/2021    8:30 AM  In your present state of health, do you have any difficulty performing the following activities:  Hearing? 1  Comment right  ear only  Vision? 1  Comment cataract surgery is scheduled  Difficulty concentrating or making decisions? 0  Walking or climbing stairs? 0  Dressing or bathing? 0  Preparing Food and eating ? N  Using the Toilet? N  In the past six months, have you accidently leaked urine? N  Do you have problems with loss of bowel control? N  Managing your Medications? N  Managing your Finances? N  Housekeeping or managing your Housekeeping? N    Patient Care Team: Wendie Agreste, MD as PCP - General (Family Medicine) Leonie Man, MD as PCP - Cardiology (Cardiology) Leonie Man, MD as Consulting Physician (Cardiology) Madelin Rear, [facility] as Pharmacist (Pharmacist)  Indicate any recent Medical Services you may have received from other than [facility] providers in the past year (date may be approximate).     Assessment:   This is a routine wellness examination for [facility].  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 - declines hearing aid Vision Screening - Comments:: Wears rx glasses - up to date with routine eye exams with [facility] - has cataract surgery scheduled with Dr Nancy Fetter next month  Dietary issues and exercise activities discussed: Current Exercise Habits: The patient has a physically strenuous job, but has no regular exercise apart from work., Type of exercise: walking;Other - see comments (gets in and out of truck, lifting, pulling, etc), Time (Minutes): 20, Frequency (Times/Week): 5, Weekly Exercise (Minutes/Week): 100, Intensity: Mild, Exercise limited by: cardiac condition(s)   Goals Addressed             This Visit's Progress    Patient Stated       Stay healthy       Depression Screen    05/26/2021    8:27 AM 02/09/2021    3:11 PM 11/17/2019    8:23 AM 11/14/2018    8:37 AM 11/06/2017    3:37 PM 01/26/2017   10:17 AM 12/14/2015    8:31 AM  PHQ 2/9 Scores  PHQ - 2 Score 0 0 1 0 0 0 1  PHQ- 9 Score  2 1 0 1  3     Fall Risk    05/26/2021  8:22 AM 02/09/2021    3:11 PM 11/14/2018    8:37 AM 01/26/2017   10:17 AM 12/14/2015    8:29 AM  Fall Risk   Falls in the past year? 0 0 0 No No  Number falls in past yr: 0 0 0    Injury with Fall? 0 0 0    Risk for fall due to : No Fall Risks No Fall Risks     Follow up Falls prevention discussed Falls evaluation completed Falls evaluation completed      [place]:  Any stairs in or around the home? Yes  If so, are there any without handrails? No  Home free of loose throw rugs in walkways, pet beds, electrical cords, etc? Yes  Adequate lighting in your home to reduce risk of falls? Yes   ASSISTIVE DEVICES UTILIZED TO PREVENT FALLS:  Life alert? No  Use of a cane, walker or w/c? No  Grab bars in the bathroom? No  Shower chair or bench in shower? No  Elevated toilet seat or a handicapped toilet? No   TIMED UP AND GO:  Was the test performed? No . Telephonic visit  Cognitive Function:        05/26/2021    8:34 AM  6CIT Screen  What Year? 0 points  What month? 0 points  What time? 0 points  Count back from 20 0 points  Months in reverse 0 points  Repeat phrase 0 points  Total Score 0 points    Immunizations Immunization History  Administered Date(s) Administered   Influenza Inj Mdck Quad Pf 10/03/2017   Influenza,inj,Quad PF,6+ Mos 09/21/2018, 10/03/2019   Influenza-Unspecified 12/01/2020   Moderna Sars-Covid-2 Vaccination 03/29/2019, 04/26/2019   PFIZER(Purple Top)SARS-COV-2 Vaccination 11/07/2019   Pfizer Covid-19 Vaccine Bivalent Booster 63yr & up 02/25/2021   Tdap 11/17/2019    TDAP status: Up to date  Flu Vaccine status: Up to date  Pneumococcal vaccine status: Due, Education has been provided regarding the importance of this vaccine. Advised may receive this vaccine at local pharmacy or Health Dept. Aware to provide a copy of the vaccination record if obtained from local pharmacy or Health  Dept. Verbalized acceptance and understanding.  Covid-19 vaccine status: Completed vaccines  Qualifies for Shingles Vaccine? Yes   Zostavax completed No   Shingrix Completed?: No.    Education has been provided regarding the importance of this vaccine. Patient has been advised to call insurance company to determine out of pocket expense if they have not yet received this vaccine. Advised may also receive vaccine at local pharmacy or Health Dept. Verbalized acceptance and understanding.  Screening Tests Health Maintenance  Topic Date Due   Pneumonia Vaccine 66 Years old (1 - PCV) Never done   Zoster Vaccines- Shingrix (1 of 2) Never done   COLONOSCOPY (Pts 45-460yrInsurance coverage will need to be confirmed)  12/25/2019   INFLUENZA VACCINE  08/02/2021   TETANUS/TDAP  11/16/2029   COVID-19 Vaccine  Completed   Hepatitis C Screening  Completed   HPV VACCINES  Aged Out    Health Maintenance  Health Maintenance Due  Topic Date Due   Pneumonia Vaccine [age]Years old (1 - PCV) Never done   Zoster Vaccines- Shingrix (1 of 2) Never done   COLONOSCOPY (Pts 45-4928yrnsurance coverage will need to be confirmed)  12/25/2019    Colorectal cancer screening: Type of screening: Colonoscopy. Completed 12/25/2018. Repeat every 1 years He knows he needs to  reschedule this - declines at this time  Lung Cancer Screening: (Low Dose CT Chest recommended if Age 19-80 years, 30 pack-year currently smoking OR have quit w/in 15years.) does qualify.   Lung Cancer Screening Referral: discuss with PCP  Additional Screening:  Hepatitis C Screening: does qualify; Completed 12/08/2014  Vision Screening: Recommended annual ophthalmology exams for early detection of glaucoma and other disorders of the eye. Is the patient up to date with their annual eye exam?  Yes  Who is the provider or what is the name of the office in which the patient attends annual eye exams? [place] If pt is not established with a  provider, would they like to be referred to a provider to establish care? No .   Dental Screening: Recommended annual dental exams for proper oral hygiene  Community Resource Referral / Chronic Care Management: CRR required this visit?  No   CCM required this visit?  No      Plan:     I have personally reviewed and noted the following in the patient's chart:   Medical and social history Use of alcohol, tobacco or illicit drugs  Current medications and supplements including opioid prescriptions. Patient is not currently taking opioid prescriptions. Functional ability and status Nutritional status Physical activity Advanced directives List of other physicians Hospitalizations, surgeries, and ER visits in previous 12 months Vitals Screenings to include cognitive, depression, and falls Referrals and appointments  In addition, I have reviewed and discussed with patient certain preventive protocols, quality metrics, and best practice recommendations. A written personalized care plan for preventive services as well as general preventive health recommendations were provided to patient.     Sandrea Hammond, LPN   1/76/1607   Nurse Notes: Feels like his prostate is swollen - sometimes he has to strain to urinate - admits to not drinking enough water, sitting in truck all day, no exercise - I advised all of these things can be affecting prostate - try changing and call for appt if no better.

## 2021-06-09 DIAGNOSIS — H2511 Age-related nuclear cataract, right eye: Secondary | ICD-10-CM | POA: Diagnosis not present

## 2021-06-16 DIAGNOSIS — H25011 Cortical age-related cataract, right eye: Secondary | ICD-10-CM | POA: Diagnosis not present

## 2021-07-14 DIAGNOSIS — H5201 Hypermetropia, right eye: Secondary | ICD-10-CM | POA: Diagnosis not present

## 2021-09-09 ENCOUNTER — Ambulatory Visit (INDEPENDENT_AMBULATORY_CARE_PROVIDER_SITE_OTHER): Payer: Medicare HMO | Admitting: Family Medicine

## 2021-09-09 ENCOUNTER — Encounter: Payer: Self-pay | Admitting: Family Medicine

## 2021-09-09 VITALS — BP 138/78 | HR 80 | Temp 97.2°F | Ht 66.0 in | Wt 186.0 lb

## 2021-09-09 DIAGNOSIS — Z23 Encounter for immunization: Secondary | ICD-10-CM

## 2021-09-09 DIAGNOSIS — I10 Essential (primary) hypertension: Secondary | ICD-10-CM

## 2021-09-09 DIAGNOSIS — R7303 Prediabetes: Secondary | ICD-10-CM | POA: Diagnosis not present

## 2021-09-09 DIAGNOSIS — R12 Heartburn: Secondary | ICD-10-CM | POA: Diagnosis not present

## 2021-09-09 DIAGNOSIS — N5201 Erectile dysfunction due to arterial insufficiency: Secondary | ICD-10-CM

## 2021-09-09 DIAGNOSIS — F17210 Nicotine dependence, cigarettes, uncomplicated: Secondary | ICD-10-CM

## 2021-09-09 DIAGNOSIS — D2230 Melanocytic nevi of unspecified part of face: Secondary | ICD-10-CM

## 2021-09-09 DIAGNOSIS — Z8719 Personal history of other diseases of the digestive system: Secondary | ICD-10-CM

## 2021-09-09 DIAGNOSIS — Z951 Presence of aortocoronary bypass graft: Secondary | ICD-10-CM

## 2021-09-09 DIAGNOSIS — E785 Hyperlipidemia, unspecified: Secondary | ICD-10-CM

## 2021-09-09 LAB — COMPREHENSIVE METABOLIC PANEL
ALT: 23 U/L (ref 0–53)
AST: 19 U/L (ref 0–37)
Albumin: 4.2 g/dL (ref 3.5–5.2)
Alkaline Phosphatase: 58 U/L (ref 39–117)
BUN: 11 mg/dL (ref 6–23)
CO2: 25 mEq/L (ref 19–32)
Calcium: 9.6 mg/dL (ref 8.4–10.5)
Chloride: 107 mEq/L (ref 96–112)
Creatinine, Ser: 0.89 mg/dL (ref 0.40–1.50)
GFR: 89.19 mL/min (ref >60.00)
Glucose, Bld: 101 mg/dL — ABNORMAL HIGH (ref 70–99)
Potassium: 4.5 mEq/L (ref 3.5–5.1)
Sodium: 137 mEq/L (ref 135–145)
Total Bilirubin: 0.9 mg/dL (ref 0.2–1.2)
Total Protein: 7.2 g/dL (ref 6.0–8.3)

## 2021-09-09 LAB — LIPID PANEL
Cholesterol: 121 mg/dL (ref 0–200)
HDL: 30.7 mg/dL — ABNORMAL LOW (ref >39.00)
LDL Cholesterol: 54 mg/dL (ref 0–99)
NonHDL: 90.29
Total CHOL/HDL Ratio: 4
Triglycerides: 182 mg/dL — ABNORMAL HIGH (ref 0.0–149.0)
VLDL: 36.4 mg/dL (ref 0.0–40.0)

## 2021-09-09 LAB — HEMOGLOBIN A1C: Hgb A1c MFr Bld: 6.2 % (ref 4.6–6.5)

## 2021-09-09 MED ORDER — RABEPRAZOLE SODIUM 20 MG PO TBEC: 20.0000 mg | DELAYED_RELEASE_TABLET | Freq: Every day | ORAL | 3 refills | Status: DC

## 2021-09-09 MED ORDER — METOPROLOL SUCCINATE ER 50 MG PO TB24: ORAL_TABLET | ORAL | 2 refills | Status: DC

## 2021-09-09 MED ORDER — SILDENAFIL CITRATE 100 MG PO TABS: 50.0000 mg | ORAL_TABLET | Freq: Every day | ORAL | 11 refills | Status: DC | PRN

## 2021-09-09 NOTE — Progress Notes (Signed)
Subjective:  Patient ID: [name] Benitez, male    DOB: Dec 17, 1955  Age: 66 y.o. MRN: [id number]  CC:  Chief Complaint  Patient presents with   Hypertension    Pt states all is well   Eye Problem    Pt states something bothering him in his right eye    HPI [name] Benitez presents for   Hypertension: With hx of CAD. Followed by cardiology, Dr. Ellyn Hack.  Hypertension treated with Lotensin, metoprolol, on aspirin and Zocor as well. Evaluate for erectile dysfunction, discussed in February.  No new side effects, no vision/hearing changes. Moderate relief ED with use. Cardiology ok with him using that med.  Labs noted from April.  Slight increased HDL and LDL.  60-monthrecheck labs planned from cardiology. Home readings: none No new med side effects Still using tobacco - 1/2 ppd. Would like to think about med for cessation. Declines for now.   BP Readings from Last 3 Encounters:  09/09/21 138/78  04/25/21 130/88  04/06/21 110/90   Lab Results  Component Value Date   CREATININE 1.10 04/28/2021   Prediabetes: Previous A1c 6.3 in 2021.  Plan for repeat labs at 6 months from April with cardiology. Sweet tea/soda - every few days.  Fast food - daily.  Lab Results  Component Value Date   HGBA1C 5.9 (H) 04/28/2021   Wt Readings from Last 3 Encounters:  09/09/21 186 lb (84.4 kg)  05/26/21 185 lb (83.9 kg)  04/25/21 186 lb (84.4 kg)   Spot under R eye: Present for years, but increasing in size, no bleeding, no drainage.  No treatments. Cataract surgery in June - optho recommended derm eval.   Takes aciphex for acid reflux - working well. No blood in stool or dark stool. No abd pain.   HM: flu and pneumonia vaccine today.  Immunization History  Administered Date(s) Administered   Influenza Inj Mdck Quad Pf 10/03/2017   Influenza,inj,Quad PF,6+ Mos 09/21/2018, 10/03/2019   Influenza-Unspecified 12/01/2020   Moderna Sars-Covid-2 Vaccination 03/29/2019, 04/26/2019    PFIZER(Purple Top)SARS-COV-2 Vaccination 11/07/2019   Pfizer Covid-19 Vaccine Bivalent Booster 135yr& up 02/25/2021   Tdap 11/17/2019     History Patient Active Problem List   Diagnosis Date Noted   Encounter for Department of Transportation (DOT) examination for driving license renewal [id number] Nocturia 05/08/2013   Atherosclerosis of native coronary artery without angina pectoris    Essential hypertension    Dyslipidemia, at goal LDL below 70    Erectile dysfunction    CAD  - S/P urgent CABG x 2 for subtotal occlusion of the LAD in a stented segment that was unable to be recanalized percutaneously 04/02/2009   Past Medical History:  Diagnosis Date   CAD in native artery    Essentially LAD occlusion with in-stent restenosis/thrombosis.  50% PDA--referred for CABG x2.   Coronary stent restenosis due to progression of disease 04/2009   Subtotal occlusion of the LAD with ISR, unable to cross with wire; aneurysmal dilation of the LAD prior to stent   Current occasional smoker    He initially quit around the time of his CABG, but he has gotten back to smoking maybe 1 or 2 a day as a stress relief.  Does not smoke every day.   Dyslipidemia, goal LDL below 70    Erectile dysfunction    History of GI bleed    No recurrence   History of vertigo    Hypertension    S/P  CABG x 03 April 2009   LIMA-LAD, SVG-RPDA (to bypass a roughly 50% lesion); post CABG stress normal with no ischemia   ST elevation (STEMI) myocardial infarction involving left anterior descending coronary artery [facility]) October 2005   LAD PCI with 2 overlapping Cypher DES 3.0 mm 23 mm   Past Surgical History:  Procedure Laterality Date   CARDIAC CATHETERIZATION  04/28/2009   Subtotal occlusion of mid LAD with ISR/thrombosis, unable to pass --> urgent CABG (Dr. Pecola Lawless)   COLONOSCOPY  2005   CORONARY ANGIOPLASTY WITH STENT PLACEMENT  10/26/2003   anterior ischemia with significant reversibility (mid anterior and  anteroseptal, apical anterior and apical anteroseptal)  - Cypher 3x55m stent to mid LAD and Cypher 3x212mCypher DES to prox LAD(Dr. R.Gerrie Nordmann  CORONARY ARTERY BYPASS GRAFT  04/2009   LIMA-LAD, reverse SVG-distal RCA (Dr. S.Chauncey CruelHendrickson)   DOPPLER ECHOCARDIOGRAPHY  04/2009   EF 50-50%, borderline septal hypokinesis   Exercise Tolerance Test  June 20/15   Exercise 8:05 Min, 10.1 METs, reached 99% max. Peak HR -- 160 bpm; NEGATIVE Bruce TM GXT = no EKG evidence of ischemia. Prolonged heart rate and blood pressure recovery and PVCs noted that resolve with Exerction.   NM MYOVIEW LTD  05/2018   EF 45%.  Low risk.  No ischemia or infarction.   No Known Allergies Prior to Admission medications   Medication Sig Start Date End Date Taking? Authorizing Provider  acetaminophen (TYLENOL) 500 MG tablet Take 500 mg by mouth 2 (two) times daily.   Yes [provider]  aspirin EC 81 MG tablet Take 81 mg by mouth daily.   Yes [provider]  benazepril (LOTENSIN) 20 MG tablet TAKE 1 TABLET BY MOUTH EVERY DAY 11/29/20  Yes HaLeonie ManMD  Doxylamine Succinate, Sleep, (SLEEP AID PO) Take by mouth daily. 2 tablets at bedtime.   Yes [provider]  fish oil-omega-3 fatty acids 1000 MG capsule Take 1 g by mouth 2 (two) times daily.    Yes [provider]  metoprolol succinate (TOPROL-XL) 50 MG 24 hr tablet TAKE 1 TABLET BY MOUTH EVERY DAY WITH OR IMMEDIATELY FOLLOWING A MEAL 02/09/21  Yes GrWendie AgresteMD  RABEprazole (ACIPHEX) 20 MG tablet Take 1 tablet (20 mg total) by mouth daily. 02/09/21  Yes GrWendie AgresteMD  simvastatin (ZOCOR) 40 MG tablet TAKE 1 TABLET BY MOUTH EVERY DAY 02/25/21  Yes HaLeonie ManMD  Difluprednate 0.05 % EMUL Apply to eye. Patient not taking: Reported on 05/26/2021 05/02/21   [provider]  ketorolac (ACULAR) 0.5 % ophthalmic solution SMARTSIG:In Eye(s) Patient not taking: Reported on 05/26/2021 05/02/21   [provider]  moxifloxacin (VIGAMOX) 0.5 % ophthalmic solution Apply to eye. Patient not taking: Reported on 05/26/2021 05/02/21   [provider]   Social History   Socioeconomic History   Marital status: Divorced    Spouse name: Not on file   Number of children: 1   Years of education: 11   Highest education level: Not on file  Occupational History    Employer: [employer]  Tobacco Use   Smoking status: Every Day    Packs/day: 0.50    Years: 50.00    Total pack years: 25.00    Types: Cigarettes   Smokeless tobacco: Never   Tobacco comments:    12 Cigarettes daily; last attempt at quitting 2005  Vaping Use   Vaping Use: Never used  Substance and Sexual Activity   Alcohol use: Yes    Comment: occas   Drug use: No   Sexual activity: Yes    Birth control/protection: Condom  Other Topics Concern   Not on file  Social History Narrative   Divorced father of one, grandfather 2. No longer smokes -- quit in 2005. Works for [employer] in [place]. Does not get routine activity/exercise -- Basically, he is not motivated to do any exercise. He, by himself, is admittedly lazy. Does not drink alcohol.   Social Determinants of Health   Financial Resource Strain: Low Risk  (05/26/2021)   Overall Financial Resource Strain (CARDIA)    Difficulty of Paying Living Expenses: Not hard at all  Food Insecurity: No Food Insecurity (05/26/2021)   Hunger Vital Sign    Worried About Running Out of Food in the Last Year: Never true    Ran Out of Food in the Last Year: Never true  Transportation Needs: No Transportation Needs (05/26/2021)   PRAPARE - Hydrologist (Medical): No    Lack of Transportation (Non-Medical): No  Physical Activity: Insufficiently Active (05/26/2021)   Exercise Vital Sign    Days of Exercise per Week: 5 days    Minutes of Exercise per Session: 20 min  Stress: No Stress Concern Present (05/26/2021)   [place]    Feeling of Stress : Not at all  Social Connections: Socially Isolated (05/26/2021)   Social Connection and Isolation Panel [NHANES]    Frequency of Communication with Friends and Family: More than three times a week    Frequency of Social Gatherings with Friends and Family: Twice a week    Attends Religious Services: Never    Marine scientist or Organizations: No    Attends Archivist Meetings: Never    Marital Status: Divorced  Human resources officer Violence: Not At Risk (05/26/2021)   Humiliation, Afraid, Rape, and Kick questionnaire    Fear of Current or Ex-Partner: No    Emotionally Abused: No    Physically Abused: No    Sexually Abused: No    Review of Systems   Objective:   Vitals:   09/09/21 0907  BP: 138/78  Pulse: 80  Temp: (!) 97.2 F (36.2 C)  SpO2: 97%  Weight: 186 lb (84.4 kg)  Height: '5\' 6"'$  (1.676 m)     Physical Exam     Assessment & Plan:  [name] Benitez is a 66 y.o. male . Essential hypertension  CAD  - S/P urgent CABG x 2 for subtotal occlusion of the LAD in a stented segment that was unable to be recanalized percutaneously  History of GI bleed  Heartburn  Need for vaccination for Strep pneumoniae - Plan: Pneumococcal conjugate vaccine 13-valent  Need for influenza vaccination - Plan: Flu Vaccine QUAD High Dose(Fluad)   No orders of the defined types were placed in this encounter.  There are no Patient Instructions on file for this visit.    Signed,   Merri Ray, MD [place] 09/09/21 9:40 AM

## 2021-09-09 NOTE — Patient Instructions (Addendum)
It is very important that you quit smoking with your heart health history. See info below, let me know if you would like me to prescribe Chantix.  Follow up if viagra is not effective or new side effects.  Cut back on fast food - try to prepare more food at home.  No med changes today. If concerns on labs I will let you know.   Pneumonia and flu vaccines were given today.  You can get the shingles vaccine at your pharmacy if you would like.  Thank you again for coming in today and take care!   Steps to Quit Smoking Smoking tobacco is the leading cause of preventable death. It can affect almost every organ in the body. Smoking puts you and those around you at risk for developing many serious chronic diseases. Quitting smoking can be very challenging. Do not get discouraged if you are not successful the first time. Some people need to make many attempts to quit before they achieve long-term success. Do your best to stick to your quit plan, and talk with your health care provider if you have any questions or concerns. How do I get ready to quit? When you decide to quit smoking, create a plan to help you succeed. Before you quit: Pick a date to quit. Set a date within the next 2 weeks to give you time to prepare. Write down the reasons why you are quitting. Keep this list in places where you will see it often. Tell your family, friends, and co-workers that you are quitting. Support from people you are close to can make quitting easier. Talk with your health care provider about your options for quitting smoking. Find out what treatment options are covered by your health insurance. Identify people, places, things, and activities that make you want to smoke (triggers). Avoid them. What first steps can I take to quit smoking? Throw away all cigarettes at home, at work, and in your car. Throw away smoking accessories, such as Scientist, research (medical). Clean your car. Make sure to empty the ashtray. Clean  your home, including curtains and carpets. What strategies can I use to quit smoking? Talk with your health care provider about combining strategies, such as taking medicines while you are also receiving in-person counseling. Using these two strategies together makes you more likely to succeed in quitting than if you used either strategy on its own. If you are pregnant or breastfeeding, talk with your health care provider about finding counseling or other support strategies to quit smoking. Do not take medicine to help you quit smoking unless your health care provider tells you to. Quit right away Quit smoking completely, instead of gradually reducing how much you smoke over a period of time. Stopping smoking right away may be more successful than gradually quitting. Attend in-person counseling to help you build problem-solving skills. You are more likely to succeed in quitting if you attend counseling sessions regularly. Even short sessions of 10 minutes can be effective. Take medicine You may take medicines to help you quit smoking. Some medicines require a prescription. You can also purchase over-the-counter medicines. Medicines may have nicotine in them to replace the nicotine in cigarettes. Medicines may: Help to stop cravings. Help to relieve withdrawal symptoms. Your health care provider may recommend: Nicotine patches, gum, or lozenges. Nicotine inhalers or sprays. Non-nicotine medicine that you take by mouth. Find resources Find resources and support systems that can help you quit smoking and remain smoke-free after you quit. These  resources are most helpful when you use them often. They include: Online chats with a Social worker. Telephone quitlines. Printed Furniture conservator/restorer. Support groups or group counseling. Text messaging programs. Mobile phone apps or applications. Use apps that can help you stick to your quit plan by providing reminders, tips, and encouragement. Examples of free  services include Quit Guide from the CDC and smokefree.gov  What can I do to make it easier to quit?  Reach out to your family and friends for support and encouragement. Call telephone quitlines, such as 1-800-QUIT-NOW, reach out to support groups, or work with a counselor for support. Ask people who smoke to avoid smoking around you. Avoid places that trigger you to smoke, such as bars, parties, or smoke-break areas at work. Spend time with people who do not smoke. Lessen the stress in your life. Stress can be a smoking trigger for some people. To lessen stress, try: Exercising regularly. Doing deep-breathing exercises. Doing yoga. Meditating. What benefits will I see if I quit smoking? Over time, you should start to see positive results, such as: Improved sense of smell and taste. Decreased coughing and sore throat. Slower heart rate. Lower blood pressure. Clearer and healthier skin. The ability to breathe more easily. Fewer sick days. Summary Quitting smoking can be very challenging. Do not get discouraged if you are not successful the first time. Some people need to make many attempts to quit before they achieve long-term success. When you decide to quit smoking, create a plan to help you succeed. Quit smoking right away, not slowly over a period of time. Find resources and support systems that can help you quit smoking and remain smoke-free after you quit. This information is not intended to replace advice given to you by your health care provider. Make sure you discuss any questions you have with your health care provider. Document Revised: 12/10/2020 Document Reviewed: 12/10/2020 Elsevier Patient Education  [place].

## 2021-10-24 DIAGNOSIS — L820 Inflamed seborrheic keratosis: Secondary | ICD-10-CM | POA: Diagnosis not present

## 2021-11-02 ENCOUNTER — Other Ambulatory Visit: Payer: Self-pay | Admitting: Cardiology

## 2022-01-16 ENCOUNTER — Telehealth: Payer: Self-pay | Admitting: Family Medicine

## 2022-01-16 NOTE — Telephone Encounter (Signed)
Patient called stating that he has an issue that he would like to discuss with Dr Carlota Raspberry and did not feel comfortable disclosing to a woman. I let him know that Dr Carlota Raspberry was with patients right now but I would send him a message and see if he could call him later today or this evening.

## 2022-01-16 NOTE — Telephone Encounter (Signed)
Called patient.  Notes sores on penis. Small blisters, on head of penis. Painful. Noted a week after intercourse, current partner for past 3 years. No new or other partners. Partner has had rash as well. Initial flare 2 years ago. Goes away for awhile. Has recurred a few times since then. Every few months. Current rash has been present for 2 weeks, getting better with use of hydrocortisone cream.  Current relationship has ended, but would like to be careful not to transmit to another partner if he becomes sexually active with others.   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 Plans in office visit in the next few days for exam of rash, can check HSV titer if needed with other STI screening tests.  Also discussed Valtrex, both for acute treatment, and prevention.  We briefly discussed possible transmission of virus to others, even without acute symptoms but antiviral may lessen that transmission.  Can discuss this further and conversations to have with his next partner at his upcoming visit.  Please schedule appointment with me Wednesday afternoon if possible for exam of rash and labs.   Thanks.

## 2022-01-17 NOTE — Telephone Encounter (Signed)
Scheduled

## 2022-01-18 ENCOUNTER — Ambulatory Visit (INDEPENDENT_AMBULATORY_CARE_PROVIDER_SITE_OTHER): Payer: Medicare HMO | Admitting: Family Medicine

## 2022-01-18 ENCOUNTER — Encounter: Payer: Self-pay | Admitting: Family Medicine

## 2022-01-18 ENCOUNTER — Other Ambulatory Visit (HOSPITAL_COMMUNITY)
Admission: RE | Admit: 2022-01-18 | Discharge: 2022-01-18 | Disposition: A | Discharge status: Home / Self Care | Payer: Medicare HMO | Source: Ambulatory Visit | Attending: Family Medicine | Admitting: Family Medicine

## 2022-01-18 VITALS — BP 132/74 | HR 66 | Temp 99.1°F | Ht 66.0 in | Wt 178.8 lb

## 2022-01-18 DIAGNOSIS — R21 Rash and other nonspecific skin eruption: Secondary | ICD-10-CM | POA: Diagnosis not present

## 2022-01-18 DIAGNOSIS — A6000 Herpesviral infection of urogenital system, unspecified: Secondary | ICD-10-CM

## 2022-01-18 DIAGNOSIS — Z113 Encounter for screening for infections with a predominantly sexual mode of transmission: Secondary | ICD-10-CM

## 2022-01-18 MED ORDER — VALACYCLOVIR HCL 500 MG PO TABS: 500.0000 mg | ORAL_TABLET | Freq: Every day | ORAL | 3 refills | Status: DC

## 2022-01-18 NOTE — Patient Instructions (Signed)
Based on your symptoms and type of rash, I am suspicious for herpes virus.  See information below.  Valtrex can be taken daily to help prevent that infection from recurring and help lessen the chance of transmission to sexual partner, but that does not completely eliminate the possibility of transmission.  I do recommend condoms if your partner has not had herpes virus.  If you do have symptoms of a recurrence or flare of that infection, increased Valtrex to twice per day for 3 days.   If any concerns on other sexually transmitted infection testing I will let you know.  Let me know if there are questions and take care.   Genital Herpes Genital herpes is a common sexually transmitted infection (STI) that is caused by a virus. The virus spreads from person to person through contact with a sore, infected saliva, or infected skin. The virus can cause itching, blisters, and sores around the genitals or rectum. During an outbreak of infection, symptoms may last for several days and then go away. However, the virus remains in the body, so more outbreaks may happen in the future. The time between outbreaks varies and can be from months to years. Genital herpes can affect anyone. It is particularly concerning for pregnant women because the virus can be passed to the baby during delivery. Genital herpes is also a concern for people who have a weak disease-fighting system (immune system). What are the causes? This condition is caused by the herpes simplex virus, type 1 or type 2 (HSV-1 or HSV-2). The virus may spread through: Sexual contact with an infected person, including vaginal, anal, and oral sex. Contact with a herpes sore. The skin. This means that you can get herpes from an infected partner even if there are no blisters or sores present. Your partner may not know that he or she is infected. What increases the risk? You are more likely to develop this condition if: You have sex with many partners. You do  not use latex or polyurethane condoms during sex. What are the signs or symptoms? Most people do not have symptoms or they have mild symptoms that may be mistaken for other skin problems. Symptoms may include: Small, red bumps near the genitals, rectum, or mouth. These bumps turn into blisters and then sores. Flu-like (influenza-like) symptoms, including: Fever. Body aches. Swollen lymph nodes. Headache. Painful urination. Pain and itching in the genital area or rectal area. Vaginal discharge. Tingling or shooting pain in the legs and buttocks. Generally, symptoms are more severe and last longer during the first (primary) outbreak. Influenza-like symptoms are also more common during the primary outbreak. How is this diagnosed? This condition may be diagnosed based on: A physical exam. Your medical history. Blood tests. A test of a fluid sample (culture) from an open sore. How is this treated? There is no cure for this condition, but treatment with antiviral medicines can do the following: Speed up healing and relieve symptoms. Help to reduce the spread of the virus to sexual partners. Limit the chance of future outbreaks, or make future outbreaks shorter. Lessen symptoms of future outbreaks. Your health care provider may also recommend over-the-counter medicines to help with pain and itching. Follow these instructions at home: If you have an outbreak:  Keep the affected areas dry and clean. Avoid rubbing or touching blisters and sores. If you do touch blisters or sores: Wash your hands thoroughly with soap and water for at least 20 seconds. If soap and water are  not available, use an alcohol-based hand sanitizer. Do not touch your eyes afterward. Sexual activity Do not have sexual contact during active outbreaks. Practice safe sex. Herpes can spread even if your partner does not have blisters or sores. Latex or polyurethane condoms and male condoms may help prevent the spread of  the herpes virus. Managing pain and discomfort If directed, put ice on the painful area. To do this: Put ice in a plastic bag. Place a towel between your skin and the bag. Leave the ice on for 20 minutes, 2-3 times a day. Remove the ice if your skin turns bright red. This is very important. If you cannot feel pain, heat, or cold, you have a greater risk of damage to the area. If told, take a cool sitz bath to help relieve pain or itching. A sitz bath is a water bath that you take while sitting down in water that is deep enough to cover your hips and buttocks. General instructions Take over-the-counter and prescription medicines only as told by your health care provider. If you were prescribed an antiviral medicine, use it as told by your health care provider. Do not stop using the antiviral even if you start to feel better. Keep all follow-up visits. This is important. How is this prevented? Use condoms. Although you can get genital herpes during sexual contact even with the use of a condom, a condom can provide some protection. Avoid having multiple sexual partners. Talk with your sexual partner about any symptoms either of you may have. Also, talk with your partner about any history of STIs. Do not have sexual contact if you have active symptoms of genital herpes. Contact a health care provider if: Your symptoms are not improving with medicine. Your symptoms return, or you have new symptoms. You have a fever. You have abdominal pain. You have redness, swelling, or pain in your eye. You notice new sores on other parts of your body. You have had herpes and you become pregnant or plan to become pregnant. Get help right away if: You have symptoms of viral meningitis. This is rare but may happen if the virus spreads to the brain. Symptoms may include: Severe headache or stiff neck. Muscle aches. Nausea and vomiting. Sensitivity to light. Summary Genital herpes is a common sexually  transmitted infection (STI) that is caused by the herpes simplex virus, type 1 or type 2 (HSV-1 or HSV-2). These viruses are most often spread through sexual contact with an infected person. You are more likely to develop this condition if you have sex with many partners or you do not use condoms during sex. Most people do not have symptoms or have mild symptoms that may be mistaken for other skin problems. Symptoms occur as outbreaks that may happen months or years apart. There is no cure for this condition, but treatment with oral antiviral medicines can reduce symptoms, reduce the chance of spreading the virus to a partner, prevent future outbreaks, or shorten future outbreaks. This information is not intended to replace advice given to you by your health care provider. Make sure you discuss any questions you have with your health care provider. Document Revised: 09/23/2020 Document Reviewed: 09/23/2020 Elsevier Patient Education  [place].

## 2022-01-18 NOTE — Progress Notes (Signed)
Subjective:  Patient ID: [name] Benitez, male    DOB: Aug 19, 1955  Age: 67 y.o. MRN: [id number]  CC:  Chief Complaint  Patient presents with   Rash    Pt notes a rash on his genitals notes he has had this bet=fore can go away on his own, has some blistering or small red bumps that can be irritated pt believes this is herpes unsure about exposure pain is gone now thinks he is almost better, pt notes this rash only appears after having intercourse,  has had sex with same person 3 years and notes each time they are intimate he will then have a break     HPI [name] Benitez presents for   Genital Rash: See phone note 2 nights ago.  Recurrent genital rash, blisters, suspicious for HSV.  Most recent rash improving, has been there for approximately 2 weeks and improved with use of hydrocortisone cream, less burning almost gone.  Previous partner also reportedly had rash. No rash prior  He is no longer with current partner but would like to make sure he treats this effectively or prevents it for next partner.  We discussed the testing and treatment options briefly over the phone 2 days ago. No prior STI testing or disease known.  No penile d/c.  No f/c/weight loss.  Sometimes noted after vigorous intercourse.   History Patient Active Problem List   Diagnosis Date Noted   Encounter for Department of Transportation (DOT) examination for driving license renewal 37/62/8315   Nocturia 05/08/2013   Atherosclerosis of native coronary artery without angina pectoris    Essential hypertension    Dyslipidemia, at goal LDL below 70    Erectile dysfunction    CAD  - S/P urgent CABG x 2 for subtotal occlusion of the LAD in a stented segment that was unable to be recanalized percutaneously 04/02/2009   Past Medical History:  Diagnosis Date   CAD in native artery    Essentially LAD occlusion with in-stent restenosis/thrombosis.  50% PDA--referred for CABG x2.   Coronary stent restenosis due to  progression of disease 04/2009   Subtotal occlusion of the LAD with ISR, unable to cross with wire; aneurysmal dilation of the LAD prior to stent   Current occasional smoker    He initially quit around the time of his CABG, but he has gotten back to smoking maybe 1 or 2 a day as a stress relief.  Does not smoke every day.   Dyslipidemia, goal LDL below 70    Erectile dysfunction    History of GI bleed    No recurrence   History of vertigo    Hypertension    S/P CABG x 03 April 2009   LIMA-LAD, SVG-RPDA (to bypass a roughly 50% lesion); post CABG stress normal with no ischemia   ST elevation (STEMI) myocardial infarction involving left anterior descending coronary artery [facility]) October 2005   LAD PCI with 2 overlapping Cypher DES 3.0 mm 23 mm   Past Surgical History:  Procedure Laterality Date   CARDIAC CATHETERIZATION  04/28/2009   Subtotal occlusion of mid LAD with ISR/thrombosis, unable to pass --> urgent CABG (Dr. Pecola Lawless)   COLONOSCOPY  2005   CORONARY ANGIOPLASTY WITH STENT PLACEMENT  10/26/2003   anterior ischemia with significant reversibility (mid anterior and anteroseptal, apical anterior and apical anteroseptal)  - Cypher 3x33m stent to mid LAD and Cypher 3x267mCypher DES to prox LAD(Dr. R.Gerrie Nordmann  CORONARY ARTERY BYPASS GRAFT  04/2009   LIMA-LAD, reverse SVG-distal RCA (Dr. Chauncey Cruel. Hendrickson)   DOPPLER ECHOCARDIOGRAPHY  04/2009   EF 50-50%, borderline septal hypokinesis   Exercise Tolerance Test  June 20/15   Exercise 8:05 Min, 10.1 METs, reached 99% max. Peak HR -- 160 bpm; NEGATIVE Bruce TM GXT = no EKG evidence of ischemia. Prolonged heart rate and blood pressure recovery and PVCs noted that resolve with Exerction.   NM MYOVIEW LTD  05/2018   EF 45%.  Low risk.  No ischemia or infarction.   No Known Allergies Prior to Admission medications   Medication Sig Start Date End Date Taking? Authorizing Provider  acetaminophen (TYLENOL) 500 MG tablet Take 500 mg by mouth 2  (two) times daily.    [provider]  aspirin EC 81 MG tablet Take 81 mg by mouth daily.    [provider]  benazepril (LOTENSIN) 20 MG tablet Take 1 tablet (20 mg total) by mouth daily. Please contact the office to schedule appointment for additional refills. 11/03/21   Leonie Man, MD  Difluprednate 0.05 % EMUL Apply to eye. Patient not taking: Reported on 05/26/2021 05/02/21   [provider]  Doxylamine Succinate, Sleep, (SLEEP AID PO) Take by mouth daily. 2 tablets at bedtime.    [provider]  fish oil-omega-3 fatty acids 1000 MG capsule Take 1 g by mouth 2 (two) times daily.     [provider]  ketorolac (ACULAR) 0.5 % ophthalmic solution SMARTSIG:In Eye(s) Patient not taking: Reported on 05/26/2021 05/02/21   [provider]  metoprolol succinate (TOPROL-XL) 50 MG 24 hr tablet TAKE 1 TABLET BY MOUTH EVERY DAY WITH OR IMMEDIATELY FOLLOWING A MEAL 09/09/21   Wendie Agreste, MD  moxifloxacin (VIGAMOX) 0.5 % ophthalmic solution Apply to eye. Patient not taking: Reported on 05/26/2021 05/02/21   [provider]  RABEprazole (ACIPHEX) 20 MG tablet Take 1 tablet (20 mg total) by mouth daily. 09/09/21   Wendie Agreste, MD  sildenafil (VIAGRA) 100 MG tablet Take 0.5-1 tablets (50-100 mg total) by mouth daily as needed for erectile dysfunction. 09/09/21   Wendie Agreste, MD  simvastatin (ZOCOR) 40 MG tablet TAKE 1 TABLET BY MOUTH EVERY DAY 02/25/21   Leonie Man, MD   Social History   Socioeconomic History   Marital status: Divorced    Spouse name: Not on file   Number of children: 1   Years of education: 11   Highest education level: Not on file  Occupational History    Employer: [employer]  Tobacco Use   Smoking status: Every Day    Packs/day: 0.50    Years: 50.00    Total pack years: 25.00    Types: Cigarettes   Smokeless tobacco: Never   Tobacco comments:    12 Cigarettes daily; last attempt at  quitting 2005  Vaping Use   Vaping Use: Never used  Substance and Sexual Activity   Alcohol use: Yes    Comment: occas   Drug use: No   Sexual activity: Yes    Birth control/protection: Condom  Other Topics Concern   Not on file  Social History Narrative   Divorced father of one, grandfather 2. No longer smokes -- quit in 2005. Works for [employer] in [place]. Does not get routine activity/exercise -- Basically, he is not motivated to do any exercise. He, by himself, is admittedly lazy. Does not drink alcohol.   Social Determinants of Health   Financial  Resource Strain: Low Risk  (05/26/2021)   Overall Financial Resource Strain (CARDIA)    Difficulty of Paying Living Expenses: Not hard at all  Food Insecurity: No Food Insecurity (05/26/2021)   Hunger Vital Sign    Worried About Running Out of Food in the Last Year: Never true    Ran Out of Food in the Last Year: Never true  Transportation Needs: No Transportation Needs (05/26/2021)   PRAPARE - Hydrologist (Medical): No    Lack of Transportation (Non-Medical): No  Physical Activity: Insufficiently Active (05/26/2021)   Exercise Vital Sign    Days of Exercise per Week: 5 days    Minutes of Exercise per Session: 20 min  Stress: No Stress Concern Present (05/26/2021)   [place]    Feeling of Stress : Not at all  Social Connections: Socially Isolated (05/26/2021)   Social Connection and Isolation Panel [NHANES]    Frequency of Communication with Friends and Family: More than three times a week    Frequency of Social Gatherings with Friends and Family: Twice a week    Attends Religious Services: Never    Marine scientist or Organizations: No    Attends Archivist Meetings: Never    Marital Status: Divorced  Human resources officer Violence: Not At Risk (05/26/2021)   Humiliation, Afraid, Rape, and Kick questionnaire     Fear of Current or Ex-Partner: No    Emotionally Abused: No    Physically Abused: No    Sexually Abused: No    Review of Systems Per HPI  Objective:   Vitals:   01/18/22 1431  BP: 132/74  Pulse: 66  Temp: 99.1 F (37.3 C)  TempSrc: Oral  SpO2: 97%  Weight: 178 lb 12.8 oz (81.1 kg)  Height: '5\' 6"'$  (1.676 m)     Physical Exam Constitutional:      General: He is not in acute distress.    Appearance: Normal appearance. He is well-developed.  HENT:     Head: Normocephalic and atraumatic.  Cardiovascular:     Rate and Rhythm: Normal rate.  Pulmonary:     Effort: Pulmonary effort is normal.  Genitourinary:    Penis: Lesions present.      Comments: See skin section.  Lymphadenopathy:     Lower Body: No right inguinal adenopathy. No left inguinal adenopathy.  Skin:    Findings: Rash (Faint small papular rash at distal glans, urethral meatus, few on left glans.) present.  Neurological:     Mental Status: He is alert and oriented to person, place, and time.  Psychiatric:        Mood and Affect: Mood normal.      Assessment & Plan:  ZEESHAN KORTE is a 67 y.o. male . Rash of genital area - Plan: HSV(herpes simplex vrs) 1+2 ab-IgG  Genital herpes simplex, unspecified site - Plan: HSV(herpes simplex vrs) 1+2 ab-IgG, valACYclovir (VALTREX) 500 MG tablet  Routine screening for STI (sexually transmitted infection) - Plan: HSV(herpes simplex vrs) 1+2 ab-IgG, HIV Antibody (routine testing w rflx), RPR, Urine cytology ancillary only Rash and history of outbreaks suspicious for genital HSV.  Current flare almost resolved.  Valtrex daily to lessen chance of recurrence as well as transmission but still discussed potential for transmission and safer sex practices discussed.  Can increase Valtrex to twice daily dosing if flare, check other STI testing.  All questions were answered.  Meds ordered this  encounter  Medications   valACYclovir (VALTREX) 500 MG tablet    Sig: Take  1 tablet (500 mg total) by mouth daily. Increase to BID dosing for 3 days if symptoms of flare.    Dispense:  90 tablet    Refill:  3   Patient Instructions  Based on your symptoms and type of rash, I am suspicious for herpes virus.  See information below.  Valtrex can be taken daily to help prevent that infection from recurring and help lessen the chance of transmission to sexual partner, but that does not completely eliminate the possibility of transmission.  I do recommend condoms if your partner has not had herpes virus.  If you do have symptoms of a recurrence or flare of that infection, increased Valtrex to twice per day for 3 days.   If any concerns on other sexually transmitted infection testing I will let you know.  Let me know if there are questions and take care.   Genital Herpes Genital herpes is a common sexually transmitted infection (STI) that is caused by a virus. The virus spreads from person to person through contact with a sore, infected saliva, or infected skin. The virus can cause itching, blisters, and sores around the genitals or rectum. During an outbreak of infection, symptoms may last for several days and then go away. However, the virus remains in the body, so more outbreaks may happen in the future. The time between outbreaks varies and can be from months to years. Genital herpes can affect anyone. It is particularly concerning for pregnant women because the virus can be passed to the baby during delivery. Genital herpes is also a concern for people who have a weak disease-fighting system (immune system). What are the causes? This condition is caused by the herpes simplex virus, type 1 or type 2 (HSV-1 or HSV-2). The virus may spread through: Sexual contact with an infected person, including vaginal, anal, and oral sex. Contact with a herpes sore. The skin. This means that you can get herpes from an infected partner even if there are no blisters or sores present. Your  partner may not know that he or she is infected. What increases the risk? You are more likely to develop this condition if: You have sex with many partners. You do not use latex or polyurethane condoms during sex. What are the signs or symptoms? Most people do not have symptoms or they have mild symptoms that may be mistaken for other skin problems. Symptoms may include: Small, red bumps near the genitals, rectum, or mouth. These bumps turn into blisters and then sores. Flu-like (influenza-like) symptoms, including: Fever. Body aches. Swollen lymph nodes. Headache. Painful urination. Pain and itching in the genital area or rectal area. Vaginal discharge. Tingling or shooting pain in the legs and buttocks. Generally, symptoms are more severe and last longer during the first (primary) outbreak. Influenza-like symptoms are also more common during the primary outbreak. How is this diagnosed? This condition may be diagnosed based on: A physical exam. Your medical history. Blood tests. A test of a fluid sample (culture) from an open sore. How is this treated? There is no cure for this condition, but treatment with antiviral medicines can do the following: Speed up healing and relieve symptoms. Help to reduce the spread of the virus to sexual partners. Limit the chance of future outbreaks, or make future outbreaks shorter. Lessen symptoms of future outbreaks. Your health care provider may also recommend over-the-counter medicines to help with  pain and itching. Follow these instructions at home: If you have an outbreak:  Keep the affected areas dry and clean. Avoid rubbing or touching blisters and sores. If you do touch blisters or sores: Wash your hands thoroughly with soap and water for at least 20 seconds. If soap and water are not available, use an alcohol-based hand sanitizer. Do not touch your eyes afterward. Sexual activity Do not have sexual contact during active  outbreaks. Practice safe sex. Herpes can spread even if your partner does not have blisters or sores. Latex or polyurethane condoms and male condoms may help prevent the spread of the herpes virus. Managing pain and discomfort If directed, put ice on the painful area. To do this: Put ice in a plastic bag. Place a towel between your skin and the bag. Leave the ice on for 20 minutes, 2-3 times a day. Remove the ice if your skin turns bright red. This is very important. If you cannot feel pain, heat, or cold, you have a greater risk of damage to the area. If told, take a cool sitz bath to help relieve pain or itching. A sitz bath is a water bath that you take while sitting down in water that is deep enough to cover your hips and buttocks. General instructions Take over-the-counter and prescription medicines only as told by your health care provider. If you were prescribed an antiviral medicine, use it as told by your health care provider. Do not stop using the antiviral even if you start to feel better. Keep all follow-up visits. This is important. How is this prevented? Use condoms. Although you can get genital herpes during sexual contact even with the use of a condom, a condom can provide some protection. Avoid having multiple sexual partners. Talk with your sexual partner about any symptoms either of you may have. Also, talk with your partner about any history of STIs. Do not have sexual contact if you have active symptoms of genital herpes. Contact a health care provider if: Your symptoms are not improving with medicine. Your symptoms return, or you have new symptoms. You have a fever. You have abdominal pain. You have redness, swelling, or pain in your eye. You notice new sores on other parts of your body. You have had herpes and you become pregnant or plan to become pregnant. Get help right away if: You have symptoms of viral meningitis. This is rare but may happen if the virus  spreads to the brain. Symptoms may include: Severe headache or stiff neck. Muscle aches. Nausea and vomiting. Sensitivity to light. Summary Genital herpes is a common sexually transmitted infection (STI) that is caused by the herpes simplex virus, type 1 or type 2 (HSV-1 or HSV-2). These viruses are most often spread through sexual contact with an infected person. You are more likely to develop this condition if you have sex with many partners or you do not use condoms during sex. Most people do not have symptoms or have mild symptoms that may be mistaken for other skin problems. Symptoms occur as outbreaks that may happen months or years apart. There is no cure for this condition, but treatment with oral antiviral medicines can reduce symptoms, reduce the chance of spreading the virus to a partner, prevent future outbreaks, or shorten future outbreaks. This information is not intended to replace advice given to you by your health care provider. Make sure you discuss any questions you have with your health care provider. Document Revised: 09/23/2020 Document Reviewed: 09/23/2020  Elsevier Patient Education  2023 [place],   Merri Ray, MD [place], [facility] 01/18/22 3:28 PM

## 2022-01-19 LAB — HSV(HERPES SIMPLEX VRS) I + II AB-IGG
HAV 1 IGG,TYPE SPECIFIC AB: 7.07 index — ABNORMAL HIGH
HSV 2 IGG,TYPE SPECIFIC AB: 2.27 index — ABNORMAL HIGH

## 2022-01-19 LAB — HIV ANTIBODY (ROUTINE TESTING W REFLEX): HIV 1&2 Ab, 4th Generation: NONREACTIVE

## 2022-01-19 LAB — RPR: RPR Ser Ql: NONREACTIVE

## 2022-01-20 LAB — URINE CYTOLOGY ANCILLARY ONLY
Chlamydia: NEGATIVE
Comment: NEGATIVE
Comment: NEGATIVE
Comment: NORMAL
Neisseria Gonorrhea: NEGATIVE
Trichomonas: NEGATIVE

## 2022-02-02 HISTORY — PX: OTHER SURGICAL HISTORY: SHX169

## 2022-02-23 DIAGNOSIS — M79676 Pain in unspecified toe(s): Secondary | ICD-10-CM | POA: Diagnosis not present

## 2022-02-23 DIAGNOSIS — L03031 Cellulitis of right toe: Secondary | ICD-10-CM | POA: Diagnosis not present

## 2022-02-23 DIAGNOSIS — L03032 Cellulitis of left toe: Secondary | ICD-10-CM | POA: Diagnosis not present

## 2022-03-09 DIAGNOSIS — M79676 Pain in unspecified toe(s): Secondary | ICD-10-CM | POA: Diagnosis not present

## 2022-03-09 DIAGNOSIS — L03032 Cellulitis of left toe: Secondary | ICD-10-CM | POA: Diagnosis not present

## 2022-03-09 DIAGNOSIS — L03031 Cellulitis of right toe: Secondary | ICD-10-CM | POA: Diagnosis not present

## 2022-03-15 ENCOUNTER — Encounter: Payer: Medicare HMO | Admitting: Family Medicine

## 2022-03-29 ENCOUNTER — Encounter: Payer: Self-pay | Admitting: Family Medicine

## 2022-03-29 ENCOUNTER — Ambulatory Visit (INDEPENDENT_AMBULATORY_CARE_PROVIDER_SITE_OTHER): Payer: Medicare HMO | Admitting: Family Medicine

## 2022-03-29 VITALS — BP 112/78 | HR 81 | Temp 98.2°F | Ht 66.0 in | Wt 171.6 lb

## 2022-03-29 DIAGNOSIS — R7303 Prediabetes: Secondary | ICD-10-CM

## 2022-03-29 DIAGNOSIS — I10 Essential (primary) hypertension: Secondary | ICD-10-CM

## 2022-03-29 DIAGNOSIS — Z125 Encounter for screening for malignant neoplasm of prostate: Secondary | ICD-10-CM

## 2022-03-29 DIAGNOSIS — Z1211 Encounter for screening for malignant neoplasm of colon: Secondary | ICD-10-CM | POA: Diagnosis not present

## 2022-03-29 DIAGNOSIS — Z0000 Encounter for general adult medical examination without abnormal findings: Secondary | ICD-10-CM | POA: Diagnosis not present

## 2022-03-29 DIAGNOSIS — N5201 Erectile dysfunction due to arterial insufficiency: Secondary | ICD-10-CM

## 2022-03-29 DIAGNOSIS — R3911 Hesitancy of micturition: Secondary | ICD-10-CM

## 2022-03-29 DIAGNOSIS — Z7982 Long term (current) use of aspirin: Secondary | ICD-10-CM

## 2022-03-29 DIAGNOSIS — Z951 Presence of aortocoronary bypass graft: Secondary | ICD-10-CM

## 2022-03-29 DIAGNOSIS — F17200 Nicotine dependence, unspecified, uncomplicated: Secondary | ICD-10-CM

## 2022-03-29 NOTE — Progress Notes (Unsigned)
Subjective:  Patient ID: [name] Benitez, male    DOB: [birth year]  Age: 67 y.o. MRN: [id number]  CC:  Chief Complaint  Patient presents with   Annual Exam    HPI [name] Benitez presents for Annual Exam No acute concerns. No health changes since last visit.  Was able to get his old motorcycle running - '76 [employer].   Hypertension: With history of CAD, cardiologist Dr. Ellyn Hack.  Hypertension treated with Lotensin, metoprolol.  Also on aspirin. Home readings: none. No new side effects with meds.  BP Readings from Last 3 Encounters:  03/29/22 112/78  01/18/22 132/74  09/09/21 138/78   Lab Results  Component Value Date   CREATININE 0.89 09/09/2021   Hyperlipidemia: Simvastatin 40 mg daily.  LDL stable in September.  History of CAD as above. Appt with cardiology 6/25.  Lab Results  Component Value Date   CHOL 121 09/09/2021   HDL 30.70 (L) 09/09/2021   LDLCALC 54 09/09/2021   LDLDIRECT 60.0 11/06/2017   TRIG 182.0 (H) 09/09/2021   CHOLHDL 4 09/09/2021   Lab Results  Component Value Date   ALT 23 09/09/2021   AST 19 09/09/2021   ALKPHOS 58 09/09/2021   BILITOT 0.9 09/09/2021   Erectile dysfunction Viagra 100 mg as needed - still has some. Not taken recently. Effective with half pill.   Some flushing, nasal congestion with use.  denies chest pain or dyspnea with exertion, no vision or hearing changes.  Prediabetes: Diet/Exercise approach, stable A1c last visit, 6.3-6.2. has been working on weight loss with more salad, better eating.  Lab Results  Component Value Date   HGBA1C 6.2 09/09/2021   Wt Readings from Last 3 Encounters:  03/29/22 171 lb 9.6 oz (77.8 kg)  01/18/22 178 lb 12.8 oz (81.1 kg)  09/09/21 186 lb (84.4 kg)   GERD Aciphex daily, working well. Taking daily. Flare in heartburn if off meds.       03/29/2022    3:47 PM 01/18/2022    2:34 PM 09/09/2021    9:01 AM 05/26/2021    8:27 AM 02/09/2021    3:11 PM  Depression screen PHQ 2/9   Decreased Interest 0 0 0 0 0  Down, Depressed, Hopeless 0 0 0 0 0  PHQ - 2 Score 0 0 0 0 0  Altered sleeping 0 0 0  1  Tired, decreased energy 0 0 0  1  Change in appetite 0 0 0  0  Feeling bad or failure about yourself  0 0 0  0  Trouble concentrating 0 0 0  0  Moving slowly or fidgety/restless 0 0 0  0  Suicidal thoughts 0 0 0  0  PHQ-9 Score 0 0 0  2  Difficult doing work/chores Not difficult at all        Health Maintenance  Topic Date Due   Lung Cancer Screening  Never done   COVID-19 Vaccine (5 - 2023-24 season) 09/02/2021   Medicare Annual Wellness (AWV)  05/27/2022   Zoster Vaccines- Shingrix (1 of 2) 04/19/2022 (Originally 01/05/1974)   COLONOSCOPY (Pts 45-18yrs Insurance coverage will need to be confirmed)  09/10/2022 (Originally 12/25/2019)   DTaP/Tdap/Td (2 - Td or Tdap) 11/16/2029   Pneumonia Vaccine 61+ Years old  Completed   INFLUENZA VACCINE  Completed   Hepatitis C Screening  Completed   HPV VACCINES  Aged Out  Colonoscopy: 12/25/18. 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 Agrees to  referral.   Lung CA screening. Cutting back on smoking, only 5 today, 8 yesterday. Lung ca screening discussed- agrees to referral to discuss testing. Smoking off and on since 67yo, 1ppd on average prior to heart attack, quit for 11 yrs then restarted.   Prostate: Some times with difficulty with starting urinary flow, past 6-8 months. Only some times, not worsening. the natural history of prostate cancer and ongoing controversy regarding screening and potential treatment outcomes of prostate cancer has been discussed with the patient. The meaning of a false positive PSA and a false negative PSA has been discussed. He indicates understanding of the limitations of this screening test and wishes  to proceed with screening PSA testing. Lab Results  Component Value Date   PSA 1.22 11/17/2019   PSA 1.63 11/14/2018   PSA 1.14 11/06/2017    Immunization  History  Administered Date(s) Administered   Fluad Quad(high Dose 65+) 09/09/2021   Influenza Inj Mdck Quad Pf 10/03/2017   Influenza,inj,Quad PF,6+ Mos 09/21/2018, 10/03/2019   Influenza-Unspecified 12/01/2020   Moderna Sars-Covid-2 Vaccination 03/29/2019, 04/26/2019   PFIZER(Purple Top)SARS-COV-2 Vaccination 11/07/2019   PNEUMOCOCCAL CONJUGATE-20 09/09/2021   Pfizer Covid-19 Vaccine Bivalent Booster 62yrs & up 02/25/2021   Tdap 11/17/2019  Covid booster - recommended at pharmacy.  Shingrix - recommended, declined.   No results found. No corrective lenses, s/p cataract surgery.   Dental: every 6 months.   Alcohol: rare - none in past month. Infrequent.   Tobacco: 8 cigs per day - see above.   Exercise: no regular exercise other than climbing in and out of loader, walking. Active with work.    History Patient Active Problem List   Diagnosis Date Noted   Encounter for Department of Transportation (DOT) examination for driving license renewal [id number]   Nocturia 05/08/2013   Atherosclerosis of native coronary artery without angina pectoris    Essential hypertension    Dyslipidemia, at goal LDL below 70    Erectile dysfunction    CAD  - S/P urgent CABG x 2 for subtotal occlusion of the LAD in a stented segment that was unable to be recanalized percutaneously 04/02/2009   Past Medical History:  Diagnosis Date   CAD in native artery    Essentially LAD occlusion with in-stent restenosis/thrombosis.  50% PDA--referred for CABG x2.   Coronary stent restenosis due to progression of disease 04/2009   Subtotal occlusion of the LAD with ISR, unable to cross with wire; aneurysmal dilation of the LAD prior to stent   Current occasional smoker    He initially quit around the time of his CABG, but he has gotten back to smoking maybe 1 or 2 a day as a stress relief.  Does not smoke every day.   Dyslipidemia, goal LDL below 70    Erectile dysfunction    History of GI bleed    No  recurrence   History of vertigo    Hypertension    S/P CABG x 03 April 2009   LIMA-LAD, SVG-RPDA (to bypass a roughly 50% lesion); post CABG stress normal with no ischemia   ST elevation (STEMI) myocardial infarction involving left anterior descending coronary artery [facility]) October 2005   LAD PCI with 2 overlapping Cypher DES 3.0 mm 23 mm   Past Surgical History:  Procedure Laterality Date   CARDIAC CATHETERIZATION  04/28/2009   Subtotal occlusion of mid LAD with ISR/thrombosis, unable to pass --> urgent CABG (Dr. Pecola Lawless)   cataract surgery  02/2022   COLONOSCOPY  2005   CORONARY ANGIOPLASTY WITH STENT PLACEMENT  10/26/2003   anterior ischemia with significant reversibility (mid anterior and anteroseptal, apical anterior and apical anteroseptal)  - Cypher 3x15mm stent to mid LAD and Cypher 3x29mm Cypher DES to prox LAD(Dr. Gerrie Nordmann)   CORONARY ARTERY BYPASS GRAFT  04/2009   LIMA-LAD, reverse SVG-distal RCA (Dr. Chauncey Cruel. Hendrickson)   DOPPLER ECHOCARDIOGRAPHY  04/2009   EF 50-50%, borderline septal hypokinesis   Exercise Tolerance Test  06/21/2013   Exercise 8:05 Min, 10.1 METs, reached 99% max. Peak HR -- 160 bpm; NEGATIVE Bruce TM GXT = no EKG evidence of ischemia. Prolonged heart rate and blood pressure recovery and PVCs noted that resolve with Exerction.   NM MYOVIEW LTD  05/2018   EF 45%.  Low risk.  No ischemia or infarction.   No Known Allergies Prior to Admission medications   Medication Sig Start Date End Date Taking? Authorizing Provider  acetaminophen (TYLENOL) 500 MG tablet Take 500 mg by mouth 2 (two) times daily.   Yes [provider]  aspirin EC 81 MG tablet Take 81 mg by mouth daily.   Yes [provider]  benazepril (LOTENSIN) 20 MG tablet Take 1 tablet (20 mg total) by mouth daily. Please contact the office to schedule appointment for additional refills. 11/03/21  Yes Leonie Man, MD  Difluprednate 0.05 % EMUL Apply to eye. 05/02/21  Yes [provider]  fish oil-omega-3 fatty acids 1000 MG capsule Take 1 g by mouth 2 (two) times daily.    Yes [provider]  metoprolol succinate (TOPROL-XL) 50 MG 24 hr tablet TAKE 1 TABLET BY MOUTH EVERY DAY WITH OR IMMEDIATELY FOLLOWING A MEAL 09/09/21  Yes Wendie Agreste, MD  RABEprazole (ACIPHEX) 20 MG tablet Take 1 tablet (20 mg total) by mouth daily. 09/09/21  Yes Wendie Agreste, MD  sildenafil (VIAGRA) 100 MG tablet Take 0.5-1 tablets (50-100 mg total) by mouth daily as needed for erectile dysfunction. 09/09/21  Yes Wendie Agreste, MD  simvastatin (ZOCOR) 40 MG tablet TAKE 1 TABLET BY MOUTH EVERY DAY 02/25/21  Yes Leonie Man, MD  valACYclovir (VALTREX) 500 MG tablet Take 1 tablet (500 mg total) by mouth daily. Increase to BID dosing for 3 days if symptoms of flare. 01/18/22  Yes Wendie Agreste, MD  Doxylamine Succinate, Sleep, (SLEEP AID PO) Take by mouth daily. 2 tablets at bedtime.    [provider]  ketorolac (ACULAR) 0.5 % ophthalmic solution SMARTSIG:In Eye(s) Patient not taking: Reported on 03/29/2022 05/02/21   [provider]  moxifloxacin (VIGAMOX) 0.5 % ophthalmic solution Apply to eye. Patient not taking: Reported on 05/26/2021 05/02/21   [provider]   Social History   Socioeconomic History   Marital status: Divorced    Spouse name: Not on file   Number of children: 1   Years of education: 11   Highest education level: Not on file  Occupational History    Employer: [employer]  Tobacco Use   Smoking status: Every Day    Packs/day: 0.50    Years: 50.00    Additional pack years: 0.00    Total pack years: 25.00    Types: Cigarettes   Smokeless tobacco: Never   Tobacco comments:    12 Cigarettes daily; last attempt at quitting 2005  Vaping Use   Vaping Use: Never used  Substance and Sexual Activity   Alcohol use: Yes    Comment: occas  Drug use: No   Sexual activity: Yes    Birth control/protection: Condom   Other Topics Concern   Not on file  Social History Narrative   Divorced father of one, grandfather 2. No longer smokes -- quit in 2005. Works for [employer] in [place]. Does not get routine activity/exercise -- Basically, he is not motivated to do any exercise. He, by himself, is admittedly lazy. Does not drink alcohol.   Social Determinants of Health   Financial Resource Strain: Low Risk  (05/26/2021)   Overall Financial Resource Strain (CARDIA)    Difficulty of Paying Living Expenses: Not hard at all  Food Insecurity: No Food Insecurity (05/26/2021)   Hunger Vital Sign    Worried About Running Out of Food in the Last Year: Never true    Ran Out of Food in the Last Year: Never true  Transportation Needs: No Transportation Needs (05/26/2021)   PRAPARE - Hydrologist (Medical): No    Lack of Transportation (Non-Medical): No  Physical Activity: Insufficiently Active (05/26/2021)   Exercise Vital Sign    Days of Exercise per Week: 5 days    Minutes of Exercise per Session: 20 min  Stress: No Stress Concern Present (05/26/2021)   [place]    Feeling of Stress : Not at all  Social Connections: Socially Isolated (05/26/2021)   Social Connection and Isolation Panel [NHANES]    Frequency of Communication with Friends and Family: More than three times a week    Frequency of Social Gatherings with Friends and Family: Twice a week    Attends Religious Services: Never    Marine scientist or Organizations: No    Attends Archivist Meetings: Never    Marital Status: Divorced  Human resources officer Violence: Not At Risk (05/26/2021)   Humiliation, Afraid, Rape, and Kick questionnaire    Fear of Current or Ex-Partner: No    Emotionally Abused: No    Physically Abused: No    Sexually Abused: No    Review of Systems 13 point review of systems per patient health survey noted.   Negative other than as indicated above or in HPI.    Objective:   Vitals:   03/29/22 1603  BP: 112/78  Pulse: 81  Temp: 98.2 F (36.8 C)  TempSrc: Temporal  SpO2: 97%  Weight: 171 lb 9.6 oz (77.8 kg)  Height: 5\' 6"  (1.676 m)   {Vitals History (Optional):23777}  Physical Exam Vitals reviewed.  Constitutional:      Appearance: He is well-developed.  HENT:     Head: Normocephalic and atraumatic.     Right Ear: External ear normal.     Left Ear: External ear normal.  Eyes:     Conjunctiva/sclera: Conjunctivae normal.     Pupils: Pupils are equal, round, and reactive to light.  Neck:     Thyroid: No thyromegaly.  Cardiovascular:     Rate and Rhythm: Normal rate and regular rhythm.     Heart sounds: Normal heart sounds.  Pulmonary:     Effort: Pulmonary effort is normal. No respiratory distress.     Breath sounds: Normal breath sounds. No wheezing.  Abdominal:     General: There is no distension.     Palpations: Abdomen is soft.     Tenderness: There is no abdominal tenderness.  Musculoskeletal:        General: No tenderness. Normal range of motion.  Cervical back: Normal range of motion and neck supple.  Lymphadenopathy:     Cervical: No cervical adenopathy.  Skin:    General: Skin is warm and dry.  Neurological:     Mental Status: He is alert and oriented to person, place, and time.     Deep Tendon Reflexes: Reflexes are normal and symmetric.  Psychiatric:        Behavior: Behavior normal.     Assessment & Plan:  [name] Benitez is a 67 y.o. male . Annual physical exam - Plan: Comp Met (CMET), Lipid panel, HgB A1c  Prediabetes - Plan: Lipid panel, HgB A1c  CAD  - S/P urgent CABG x 2 for subtotal occlusion of the LAD in a stented segment that was unable to be recanalized percutaneously - Plan: CBC w/Diff  Essential hypertension - Plan: CBC w/Diff, TSH  Erectile dysfunction due to arterial insufficiency  Current use of aspirin - Plan: CBC  w/Diff  Current smoker - Plan: Ambulatory Referral Lung Cancer Screening [facility]  Screen for colon cancer - Plan: Ambulatory referral to Gastroenterology  Urinary hesitancy - Plan: PSA, Medicare ( [place] [facility] only)  Screening for prostate cancer - Plan: PSA, Medicare (  [facility] only)   No orders of the defined types were placed in this encounter.  Patient Instructions  I will refer you to discuss lung cancer screening and repeat colonoscopy.  Quitting smoking is one of the best things you can do for your health. Let me know if I can help!  I will check prostate test, but we can discuss urinary symptoms further at your next visit. That may be from enlarged prostate.  I will see you in next month. Return to the clinic or go to the nearest emergency room if any of your symptoms worsen or new symptoms occur.  Thanks for coming in today. Take care!     Steps to Quit Smoking Smoking tobacco is the leading cause of preventable death. It can affect almost every organ in the body. Smoking puts you and those around you at risk for developing many serious chronic diseases. Quitting smoking can be very challenging. Do not get discouraged if you are not successful the first time. Some people need to make many attempts to quit before they achieve long-term success. Do your best to stick to your quit plan, and talk with your health care provider if you have any questions or concerns. How do I get ready to quit? When you decide to quit smoking, create a plan to help you succeed. Before you quit: Pick a date to quit. Set a date within the next 2 weeks to give you time to prepare. Write down the reasons why you are quitting. Keep this list in places where you will see it often. Tell your family, friends, and co-workers that you are quitting. Support from people you are close to can make quitting easier. Talk with your health care provider about your options for quitting  smoking. Find out what treatment options are covered by your health insurance. Identify people, places, things, and activities that make you want to smoke (triggers). Avoid them. What first steps can I take to quit smoking? Throw away all cigarettes at home, at work, and in your car. Throw away smoking accessories, such as Scientist, research (medical). Clean your car. Make sure to empty the ashtray. Clean your home, including curtains and carpets. What strategies can I use to quit smoking? Talk with your health care provider about  combining strategies, such as taking medicines while you are also receiving in-person counseling. Using these two strategies together makes you more likely to succeed in quitting than if you used either strategy on its own. If you are pregnant or breastfeeding, talk with your health care provider about finding counseling or other support strategies to quit smoking. Do not take medicine to help you quit smoking unless your health care provider tells you to. Quit right away Quit smoking completely, instead of gradually reducing how much you smoke over a period of time. Stopping smoking right away may be more successful than gradually quitting. Attend in-person counseling to help you build problem-solving skills. You are more likely to succeed in quitting if you attend counseling sessions regularly. Even short sessions of 10 minutes can be effective. Take medicine You may take medicines to help you quit smoking. Some medicines require a prescription. You can also purchase over-the-counter medicines. Medicines may have nicotine in them to replace the nicotine in cigarettes. Medicines may: Help to stop cravings. Help to relieve withdrawal symptoms. Your health care provider may recommend: Nicotine patches, gum, or lozenges. Nicotine inhalers or sprays. Non-nicotine medicine that you take by mouth. Find resources Find resources and support systems that can help you quit smoking  and remain smoke-free after you quit. These resources are most helpful when you use them often. They include: Online chats with a Social worker. Telephone quitlines. Printed Furniture conservator/restorer. Support groups or group counseling. Text messaging programs. Mobile phone apps or applications. Use apps that can help you stick to your quit plan by providing reminders, tips, and encouragement. Examples of free services include Quit Guide from the CDC and smokefree.gov  What can I do to make it easier to quit?  Reach out to your family and friends for support and encouragement. Call telephone quitlines, such as 1-800-QUIT-NOW, reach out to support groups, or work with a counselor for support. Ask people who smoke to avoid smoking around you. Avoid places that trigger you to smoke, such as bars, parties, or smoke-break areas at work. Spend time with people who do not smoke. Lessen the stress in your life. Stress can be a smoking trigger for some people. To lessen stress, try: Exercising regularly. Doing deep-breathing exercises. Doing yoga. Meditating. What benefits will I see if I quit smoking? Over time, you should start to see positive results, such as: Improved sense of smell and taste. Decreased coughing and sore throat. Slower heart rate. Lower blood pressure. Clearer and healthier skin. The ability to breathe more easily. Fewer sick days. Summary Quitting smoking can be very challenging. Do not get discouraged if you are not successful the first time. Some people need to make many attempts to quit before they achieve long-term success. When you decide to quit smoking, create a plan to help you succeed. Quit smoking right away, not slowly over a period of time. Find resources and support systems that can help you quit smoking and remain smoke-free after you quit. This information is not intended to replace advice given to you by your health care provider. Make sure you discuss any  questions you have with your health care provider. Document Revised: 12/10/2020 Document Reviewed: 12/10/2020 Elsevier Patient Education  [place] 65 Years and Older, Male Preventive care refers to lifestyle choices and visits with your health care provider that can promote health and wellness. Preventive care visits are also called wellness exams. What can I expect for my preventive care visit?  Counseling During your preventive care visit, your health care provider may ask about your: Medical history, including: Past medical problems. Family medical history. History of falls. Current health, including: Emotional well-being. Home life and relationship well-being. Sexual activity. Memory and ability to understand (cognition). Lifestyle, including: Alcohol, nicotine or tobacco, and drug use. Access to firearms. Diet, exercise, and sleep habits. Work and work Statistician. Sunscreen use. Safety issues such as seatbelt and bike helmet use. Physical exam Your health care provider will check your: Height and weight. These may be used to calculate your BMI (body mass index). BMI is a measurement that tells if you are at a healthy weight. Waist circumference. This measures the distance around your waistline. This measurement also tells if you are at a healthy weight and may help predict your risk of certain diseases, such as type 2 diabetes and high blood pressure. Heart rate and blood pressure. Body temperature. Skin for abnormal spots. What immunizations do I need?  Vaccines are usually given at various ages, according to a schedule. Your health care provider will recommend vaccines for you based on your age, medical history, and lifestyle or other factors, such as travel or where you work. What tests do I need? Screening Your health care provider may recommend screening tests for certain conditions. This may include: Lipid and cholesterol  levels. Diabetes screening. This is done by checking your blood sugar (glucose) after you have not eaten for a while (fasting). Hepatitis C test. Hepatitis B test. HIV (human immunodeficiency virus) test. STI (sexually transmitted infection) testing, if you are at risk. Lung cancer screening. Colorectal cancer screening. Prostate cancer screening. Abdominal aortic aneurysm (AAA) screening. You may need this if you are a current or former smoker. Talk with your health care provider about your test results, treatment options, and if necessary, the need for more tests. Follow these instructions at home: Eating and drinking  Eat a diet that includes fresh fruits and vegetables, whole grains, lean protein, and low-fat dairy products. Limit your intake of foods with high amounts of sugar, saturated fats, and salt. Take vitamin and mineral supplements as recommended by your health care provider. Do not drink alcohol if your health care provider tells you not to drink. If you drink alcohol: Limit how much you have to 0-2 drinks a day. Know how much alcohol is in your drink. In the [place] one drink equals one 12 oz bottle of beer (355 mL), one 5 oz glass of wine (148 mL), or one 1 oz glass of hard liquor (44 mL). Lifestyle Brush your teeth every morning and night with fluoride toothpaste. Floss one time each day. Exercise for at least 30 minutes 5 or more days each week. Do not use any products that contain nicotine or tobacco. These products include cigarettes, chewing tobacco, and vaping devices, such as e-cigarettes. If you need help quitting, ask your health care provider. Do not use drugs. If you are sexually active, practice safe sex. Use a condom or other form of protection to prevent STIs. Take aspirin only as told by your health care provider. Make sure that you understand how much to take and what form to take. Work with your health care provider to find out whether it is safe and  beneficial for you to take aspirin daily. Ask your health care provider if you need to take a cholesterol-lowering medicine (statin). Find healthy ways to manage stress, such as: Meditation, yoga, or listening to music. Journaling. Talking to a trusted person.  Spending time with friends and family. Safety Always wear your seat belt while driving or riding in a vehicle. Do not drive: If you have been drinking alcohol. Do not ride with someone who has been drinking. When you are tired or distracted. While texting. If you have been using any mind-altering substances or drugs. Wear a helmet and other protective equipment during sports activities. If you have firearms in your house, make sure you follow all gun safety procedures. Minimize exposure to UV radiation to reduce your risk of skin cancer. What's next? Visit your health care provider once a year for an annual wellness visit. Ask your health care provider how often you should have your eyes and teeth checked. Stay up to date on all vaccines. This information is not intended to replace advice given to you by your health care provider. Make sure you discuss any questions you have with your health care provider. Document Revised: 06/16/2020 Document Reviewed: 06/16/2020 Elsevier Patient Education  [place],   Merri Ray, MD [place], [facility] 03/29/22 4:41 PM

## 2022-03-29 NOTE — Patient Instructions (Addendum)
I will refer you to discuss lung cancer screening and repeat colonoscopy.  Quitting smoking is one of the best things you can do for your health. Let me know if I can help!  I will check prostate test, but we can discuss urinary symptoms further at your next visit. That may be from enlarged prostate.  I will see you in next month. Return to the clinic or go to the nearest emergency room if any of your symptoms worsen or new symptoms occur.  Thanks for coming in today. Take care!     Steps to Quit Smoking Smoking tobacco is the leading cause of preventable death. It can affect almost every organ in the body. Smoking puts you and those around you at risk for developing many serious chronic diseases. Quitting smoking can be very challenging. Do not get discouraged if you are not successful the first time. Some people need to make many attempts to quit before they achieve long-term success. Do your best to stick to your quit plan, and talk with your health care provider if you have any questions or concerns. How do I get ready to quit? When you decide to quit smoking, create a plan to help you succeed. Before you quit: Pick a date to quit. Set a date within the next 2 weeks to give you time to prepare. Write down the reasons why you are quitting. Keep this list in places where you will see it often. Tell your family, friends, and co-workers that you are quitting. Support from people you are close to can make quitting easier. Talk with your health care provider about your options for quitting smoking. Find out what treatment options are covered by your health insurance. Identify people, places, things, and activities that make you want to smoke (triggers). Avoid them. What first steps can I take to quit smoking? Throw away all cigarettes at home, at work, and in your car. Throw away smoking accessories, such as Scientist, research (medical). Clean your car. Make sure to empty the ashtray. Clean your  home, including curtains and carpets. What strategies can I use to quit smoking? Talk with your health care provider about combining strategies, such as taking medicines while you are also receiving in-person counseling. Using these two strategies together makes you more likely to succeed in quitting than if you used either strategy on its own. If you are pregnant or breastfeeding, talk with your health care provider about finding counseling or other support strategies to quit smoking. Do not take medicine to help you quit smoking unless your health care provider tells you to. Quit right away Quit smoking completely, instead of gradually reducing how much you smoke over a period of time. Stopping smoking right away may be more successful than gradually quitting. Attend in-person counseling to help you build problem-solving skills. You are more likely to succeed in quitting if you attend counseling sessions regularly. Even short sessions of 10 minutes can be effective. Take medicine You may take medicines to help you quit smoking. Some medicines require a prescription. You can also purchase over-the-counter medicines. Medicines may have nicotine in them to replace the nicotine in cigarettes. Medicines may: Help to stop cravings. Help to relieve withdrawal symptoms. Your health care provider may recommend: Nicotine patches, gum, or lozenges. Nicotine inhalers or sprays. Non-nicotine medicine that you take by mouth. Find resources Find resources and support systems that can help you quit smoking and remain smoke-free after you quit. These resources are most helpful when  you use them often. They include: Online chats with a Social worker. Telephone quitlines. Printed Furniture conservator/restorer. Support groups or group counseling. Text messaging programs. Mobile phone apps or applications. Use apps that can help you stick to your quit plan by providing reminders, tips, and encouragement. Examples of free  services include Quit Guide from the CDC and smokefree.gov  What can I do to make it easier to quit?  Reach out to your family and friends for support and encouragement. Call telephone quitlines, such as 1-800-QUIT-NOW, reach out to support groups, or work with a counselor for support. Ask people who smoke to avoid smoking around you. Avoid places that trigger you to smoke, such as bars, parties, or smoke-break areas at work. Spend time with people who do not smoke. Lessen the stress in your life. Stress can be a smoking trigger for some people. To lessen stress, try: Exercising regularly. Doing deep-breathing exercises. Doing yoga. Meditating. What benefits will I see if I quit smoking? Over time, you should start to see positive results, such as: Improved sense of smell and taste. Decreased coughing and sore throat. Slower heart rate. Lower blood pressure. Clearer and healthier skin. The ability to breathe more easily. Fewer sick days. Summary Quitting smoking can be very challenging. Do not get discouraged if you are not successful the first time. Some people need to make many attempts to quit before they achieve long-term success. When you decide to quit smoking, create a plan to help you succeed. Quit smoking right away, not slowly over a period of time. Find resources and support systems that can help you quit smoking and remain smoke-free after you quit. This information is not intended to replace advice given to you by your health care provider. Make sure you discuss any questions you have with your health care provider. Document Revised: 12/10/2020 Document Reviewed: 12/10/2020 Elsevier Patient Education  [place] 65 Years and Older, Male Preventive care refers to lifestyle choices and visits with your health care provider that can promote health and wellness. Preventive care visits are also called wellness exams. What can I expect for my  preventive care visit? Counseling During your preventive care visit, your health care provider may ask about your: Medical history, including: Past medical problems. Family medical history. History of falls. Current health, including: Emotional well-being. Home life and relationship well-being. Sexual activity. Memory and ability to understand (cognition). Lifestyle, including: Alcohol, nicotine or tobacco, and drug use. Access to firearms. Diet, exercise, and sleep habits. Work and work Statistician. Sunscreen use. Safety issues such as seatbelt and bike helmet use. Physical exam Your health care provider will check your: Height and weight. These may be used to calculate your BMI (body mass index). BMI is a measurement that tells if you are at a healthy weight. Waist circumference. This measures the distance around your waistline. This measurement also tells if you are at a healthy weight and may help predict your risk of certain diseases, such as type 2 diabetes and high blood pressure. Heart rate and blood pressure. Body temperature. Skin for abnormal spots. What immunizations do I need?  Vaccines are usually given at various ages, according to a schedule. Your health care provider will recommend vaccines for you based on your age, medical history, and lifestyle or other factors, such as travel or where you work. What tests do I need? Screening Your health care provider may recommend screening tests for certain conditions. This may include: Lipid and cholesterol  levels. Diabetes screening. This is done by checking your blood sugar (glucose) after you have not eaten for a while (fasting). Hepatitis C test. Hepatitis B test. HIV (human immunodeficiency virus) test. STI (sexually transmitted infection) testing, if you are at risk. Lung cancer screening. Colorectal cancer screening. Prostate cancer screening. Abdominal aortic aneurysm (AAA) screening. You may need this if you  are a current or former smoker. Talk with your health care provider about your test results, treatment options, and if necessary, the need for more tests. Follow these instructions at home: Eating and drinking  Eat a diet that includes fresh fruits and vegetables, whole grains, lean protein, and low-fat dairy products. Limit your intake of foods with high amounts of sugar, saturated fats, and salt. Take vitamin and mineral supplements as recommended by your health care provider. Do not drink alcohol if your health care provider tells you not to drink. If you drink alcohol: Limit how much you have to 0-2 drinks a day. Know how much alcohol is in your drink. In the [place] one drink equals one 12 oz bottle of beer (355 mL), one 5 oz glass of wine (148 mL), or one 1 oz glass of hard liquor (44 mL). Lifestyle Brush your teeth every morning and night with fluoride toothpaste. Floss one time each day. Exercise for at least 30 minutes 5 or more days each week. Do not use any products that contain nicotine or tobacco. These products include cigarettes, chewing tobacco, and vaping devices, such as e-cigarettes. If you need help quitting, ask your health care provider. Do not use drugs. If you are sexually active, practice safe sex. Use a condom or other form of protection to prevent STIs. Take aspirin only as told by your health care provider. Make sure that you understand how much to take and what form to take. Work with your health care provider to find out whether it is safe and beneficial for you to take aspirin daily. Ask your health care provider if you need to take a cholesterol-lowering medicine (statin). Find healthy ways to manage stress, such as: Meditation, yoga, or listening to music. Journaling. Talking to a trusted person. Spending time with friends and family. Safety Always wear your seat belt while driving or riding in a vehicle. Do not drive: If you have been drinking alcohol. Do  not ride with someone who has been drinking. When you are tired or distracted. While texting. If you have been using any mind-altering substances or drugs. Wear a helmet and other protective equipment during sports activities. If you have firearms in your house, make sure you follow all gun safety procedures. Minimize exposure to UV radiation to reduce your risk of skin cancer. What's next? Visit your health care provider once a year for an annual wellness visit. Ask your health care provider how often you should have your eyes and teeth checked. Stay up to date on all vaccines. This information is not intended to replace advice given to you by your health care provider. Make sure you discuss any questions you have with your health care provider. Document Revised: 06/16/2020 Document Reviewed: 06/16/2020 Elsevier Patient Education  [place].

## 2022-03-30 ENCOUNTER — Encounter: Payer: Self-pay | Admitting: Family Medicine

## 2022-04-04 ENCOUNTER — Other Ambulatory Visit: Payer: Medicare HMO

## 2022-04-04 LAB — CBC WITH DIFFERENTIAL/PLATELET
Basophils Absolute: 0 10*3/uL (ref 0.0–0.1)
Basophils Relative: 0.3 % (ref 0.0–3.0)
Eosinophils Absolute: 0.1 10*3/uL (ref 0.0–0.7)
Eosinophils Relative: 1 % (ref 0.0–5.0)
HCT: 52.2 % — ABNORMAL HIGH (ref 39.0–52.0)
Hemoglobin: 17.7 g/dL — ABNORMAL HIGH (ref 13.0–17.0)
Lymphocytes Relative: 20 % (ref 12.0–46.0)
Lymphs Abs: 2.2 10*3/uL (ref 0.7–4.0)
MCHC: 33.9 g/dL (ref 30.0–36.0)
MCV: 87.4 fl (ref 78.0–100.0)
Monocytes Absolute: 1 10*3/uL (ref 0.1–1.0)
Monocytes Relative: 9.1 % (ref 3.0–12.0)
Neutro Abs: 7.6 10*3/uL (ref 1.4–7.7)
Neutrophils Relative %: 69.6 % (ref 43.0–77.0)
Platelets: 302 10*3/uL (ref 150.0–400.0)
RBC: 5.97 Mil/uL — ABNORMAL HIGH (ref 4.22–5.81)
RDW: 14.3 % (ref 11.5–15.5)
WBC: 10.9 10*3/uL — ABNORMAL HIGH (ref 4.0–10.5)

## 2022-04-04 LAB — LIPID PANEL
Cholesterol: 122 mg/dL (ref 0–200)
HDL: 30.2 mg/dL — ABNORMAL LOW (ref >39.00)
LDL Cholesterol: 54 mg/dL (ref 0–99)
NonHDL: 91.5
Total CHOL/HDL Ratio: 4
Triglycerides: 186 mg/dL — ABNORMAL HIGH (ref 0.0–149.0)
VLDL: 37.2 mg/dL (ref 0.0–40.0)

## 2022-04-04 LAB — TSH: TSH: 1.15 u[IU]/mL (ref 0.35–5.50)

## 2022-04-04 LAB — COMPREHENSIVE METABOLIC PANEL
ALT: 22 U/L (ref 0–53)
AST: 17 U/L (ref 0–37)
Albumin: 4.2 g/dL (ref 3.5–5.2)
Alkaline Phosphatase: 72 U/L (ref 39–117)
BUN: 15 mg/dL (ref 6–23)
CO2: 27 mEq/L (ref 19–32)
Calcium: 9.8 mg/dL (ref 8.4–10.5)
Chloride: 106 mEq/L (ref 96–112)
Creatinine, Ser: 0.93 mg/dL (ref 0.40–1.50)
GFR: 85.13 mL/min (ref >60.00)
Glucose, Bld: 86 mg/dL (ref 70–99)
Potassium: 4.4 mEq/L (ref 3.5–5.1)
Sodium: 136 mEq/L (ref 135–145)
Total Bilirubin: 0.6 mg/dL (ref 0.2–1.2)
Total Protein: 6.8 g/dL (ref 6.0–8.3)

## 2022-04-04 LAB — PSA, MEDICARE: PSA: 2.41 ng/ml (ref 0.10–4.00)

## 2022-04-04 LAB — HEMOGLOBIN A1C: Hgb A1c MFr Bld: 6 % (ref 4.6–6.5)

## 2022-04-05 ENCOUNTER — Emergency Department (HOSPITAL_BASED_OUTPATIENT_CLINIC_OR_DEPARTMENT_OTHER)
Admission: EM | Admit: 2022-04-05 | Discharge: 2022-04-05 | Disposition: A | Discharge status: Home / Self Care | Payer: Medicare HMO | Attending: Emergency Medicine | Admitting: Emergency Medicine

## 2022-04-05 ENCOUNTER — Encounter: Payer: Self-pay | Admitting: Family Medicine

## 2022-04-05 ENCOUNTER — Telehealth: Payer: Self-pay | Admitting: Family Medicine

## 2022-04-05 ENCOUNTER — Encounter (HOSPITAL_BASED_OUTPATIENT_CLINIC_OR_DEPARTMENT_OTHER): Payer: Self-pay

## 2022-04-05 ENCOUNTER — Ambulatory Visit (INDEPENDENT_AMBULATORY_CARE_PROVIDER_SITE_OTHER): Payer: Medicare HMO | Admitting: Family Medicine

## 2022-04-05 ENCOUNTER — Other Ambulatory Visit: Payer: Self-pay

## 2022-04-05 VITALS — BP 122/74 | HR 72 | Temp 98.2°F | Ht 66.0 in | Wt 171.0 lb

## 2022-04-05 DIAGNOSIS — T783XXA Angioneurotic edema, initial encounter: Secondary | ICD-10-CM | POA: Diagnosis not present

## 2022-04-05 DIAGNOSIS — Z951 Presence of aortocoronary bypass graft: Secondary | ICD-10-CM | POA: Diagnosis not present

## 2022-04-05 DIAGNOSIS — R220 Localized swelling, mass and lump, head: Secondary | ICD-10-CM

## 2022-04-05 DIAGNOSIS — K140 Glossitis: Secondary | ICD-10-CM | POA: Insufficient documentation

## 2022-04-05 DIAGNOSIS — K148 Other diseases of tongue: Secondary | ICD-10-CM | POA: Diagnosis not present

## 2022-04-05 DIAGNOSIS — I2510 Atherosclerotic heart disease of native coronary artery without angina pectoris: Secondary | ICD-10-CM | POA: Diagnosis not present

## 2022-04-05 DIAGNOSIS — Z7982 Long term (current) use of aspirin: Secondary | ICD-10-CM | POA: Insufficient documentation

## 2022-04-05 MED ORDER — FAMOTIDINE 20 MG PO TABS: 20.0000 mg | ORAL_TABLET | Freq: Two times a day (BID) | ORAL | 0 refills | Status: DC

## 2022-04-05 MED ORDER — METHYLPREDNISOLONE SODIUM SUCC 125 MG IJ SOLR
125.0000 mg | Freq: Once | INTRAMUSCULAR | Status: AC
  Administered 2022-04-05: 125 mg via INTRAVENOUS
  Filled 2022-04-05: qty 2

## 2022-04-05 MED ORDER — FAMOTIDINE 20 MG PO TABS
20.0000 mg | ORAL_TABLET | Freq: Once | ORAL | Status: AC
  Administered 2022-04-05: 20 mg via ORAL
  Filled 2022-04-05: qty 1

## 2022-04-05 MED ORDER — PREDNISONE 10 MG PO TABS: 20.0000 mg | ORAL_TABLET | Freq: Every day | ORAL | 0 refills | Status: AC

## 2022-04-05 MED ORDER — DIPHENHYDRAMINE HCL 12.5 MG/5ML PO ELIX
25.0000 mg | ORAL_SOLUTION | Freq: Once | ORAL | Status: AC
  Administered 2022-04-05: 25 mg via ORAL
  Filled 2022-04-05: qty 10

## 2022-04-05 NOTE — ED Notes (Signed)
Patient given water. Drank without difficulty.  

## 2022-04-05 NOTE — Progress Notes (Signed)
Subjective:  Patient ID: [name] Benitez, male    DOB: 09-24-1955  Age: 67 y.o. MRN: [id number]  CC:  Chief Complaint  Patient presents with   Oral Swelling    Pt states that his tongue started to swell yesterday afternoon, also felt swelling in throat and was difficult to eat breakfast. Pt denies any itching and difficult breathing. States swelling has started to o down since this morning.    HPI [name] Benitez presents for   Tongue swelling Noted yesterday afternoon around 2pm. Tongue only initially. Mouth started to water.  Increased in size yesterday - more on left side. Swelling has improved since 5:30 this morning.  Also reports some swelling sensation in throat and trouble eating breakfast this morning around 7am. Still feel s like something in back of throat. Ate hot dog this morning. Feels like throat has gotten worse in office - able to clear secretions.  Able to talk without difficulty.  He does take ACE inhibitor, benazepril 20 mg daily.  He has been on this med for years. No new supplements or new foods, no new meds.    History Patient Active Problem List   Diagnosis Date Noted   Encounter for Department of Transportation (DOT) examination for driving license renewal [id number]   Nocturia 05/08/2013   Atherosclerosis of native coronary artery without angina pectoris    Essential hypertension    Dyslipidemia, at goal LDL below 70    Erectile dysfunction    CAD  - S/P urgent CABG x 2 for subtotal occlusion of the LAD in a stented segment that was unable to be recanalized percutaneously 04/02/2009   Past Medical History:  Diagnosis Date   CAD in native artery    Essentially LAD occlusion with in-stent restenosis/thrombosis.  50% PDA--referred for CABG x2.   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  Does not smoke every day.   Dyslipidemia, goal LDL below 70    Erectile dysfunction    History of GI bleed    No recurrence   History of vertigo    Hypertension    S/P CABG x 03 April 2009   LIMA-LAD, SVG-RPDA (to bypass a roughly 50% lesion); post CABG stress normal with no ischemia   ST elevation (STEMI) myocardial infarction involving left anterior descending coronary artery October 2005   LAD PCI with 2 overlapping Cypher DES 3.0 mm 23 mm   Past Surgical History:  Procedure Laterality Date   CARDIAC CATHETERIZATION  04/28/2009   Subtotal occlusion of mid LAD with ISR/thrombosis, unable to pass --> urgent CABG (Dr. Pecola Lawless)   cataract surgery  02/2022   COLONOSCOPY  2005   CORONARY ANGIOPLASTY WITH STENT PLACEMENT  10/26/2003   anterior ischemia with significant reversibility (mid anterior and anteroseptal, apical anterior and apical anteroseptal)  - Cypher 3x47mm stent to mid LAD and Cypher 3x59mm Cypher DES to prox LAD(Dr. Gerrie Nordmann)   CORONARY ARTERY BYPASS GRAFT  04/2009   LIMA-LAD, reverse SVG-distal RCA (Dr. Chauncey Cruel. Hendrickson)   DOPPLER ECHOCARDIOGRAPHY  04/2009   EF 50-50%, borderline septal hypokinesis   Exercise Tolerance Test  06/21/2013   Exercise 8:05  Min, 10.1 METs, reached 99% max. Peak HR -- 160 bpm; NEGATIVE Bruce TM GXT = no EKG evidence of ischemia. Prolonged heart rate and blood pressure recovery and PVCs noted that resolve with Exerction.   NM MYOVIEW LTD  05/2018   EF 45%.  Low risk.  No ischemia or infarction.   Allergies  Allergen Reactions   Ace Inhibitors Other (See Comments)    Angioedema 04/05/22.    Prior to Admission medications   Medication Sig Start Date End Date Taking? Authorizing Provider  acetaminophen (TYLENOL) 500 MG tablet Take 500 mg by mouth 2 (two) times daily.   Yes [provider]  aspirin EC 81 MG tablet  Take 81 mg by mouth daily.   Yes [provider]  benazepril (LOTENSIN) 20 MG tablet Take 1 tablet (20 mg total) by mouth daily. Please contact the office to schedule appointment for additional refills. 11/03/21  Yes Leonie Man, MD  Difluprednate 0.05 % EMUL Apply to eye. 05/02/21  Yes [provider]  Doxylamine Succinate, Sleep, (SLEEP AID PO) Take by mouth daily. 2 tablets at bedtime.   Yes [provider]  fish oil-omega-3 fatty acids 1000 MG capsule Take 1 g by mouth 2 (two) times daily.    Yes [provider]  ketorolac (ACULAR) 0.5 % ophthalmic solution  05/02/21  Yes [provider]  metoprolol succinate (TOPROL-XL) 50 MG 24 hr tablet TAKE 1 TABLET BY MOUTH EVERY DAY WITH OR IMMEDIATELY FOLLOWING A MEAL 09/09/21  Yes Wendie Agreste, MD  moxifloxacin (VIGAMOX) 0.5 % ophthalmic solution Apply to eye. 05/02/21  Yes [provider]  RABEprazole (ACIPHEX) 20 MG tablet Take 1 tablet (20 mg total) by mouth daily. 09/09/21  Yes Wendie Agreste, MD  sildenafil (VIAGRA) 100 MG tablet Take 0.5-1 tablets (50-100 mg total) by mouth daily as needed for erectile dysfunction. 09/09/21  Yes Wendie Agreste, MD  simvastatin (ZOCOR) 40 MG tablet TAKE 1 TABLET BY MOUTH EVERY DAY 02/25/21  Yes Leonie Man, MD  valACYclovir (VALTREX) 500 MG tablet Take 1 tablet (500 mg total) by mouth daily. Increase to BID dosing for 3 days if symptoms of flare. 01/18/22  Yes Wendie Agreste, MD   Social History   Socioeconomic History   Marital status: Divorced    Spouse name: Not on file   Number of children: 1   Years of education: 11   Highest education level: Not on file  Occupational History    Employer: [employer]  Tobacco Use   Smoking status: Every Day    Packs/day: 0.50    Years: 50.00    Additional pack years: 0.00    Total pack years: 25.00    Types: Cigarettes   Smokeless tobacco: Never   Tobacco comments:    12 Cigarettes daily;  last attempt at quitting 2005  Vaping Use   Vaping Use: Never used  Substance and Sexual Activity   Alcohol use: Yes    Comment: occas   Drug use: No   Sexual activity: Yes    Birth control/protection: Condom  Other Topics Concern   Not on file  Social History Narrative   Divorced father of one, grandfather 2. No longer smokes -- quit in 2005. Works for [employer] in [place]. Does not get routine activity/exercise -- Basically, he is not motivated to do any exercise. He, by himself, is admittedly lazy. Does not drink alcohol.   Social Determinants of Health  Financial Resource Strain: Low Risk  (05/26/2021)   Overall Financial Resource Strain (CARDIA)    Difficulty of Paying Living Expenses: Not hard at all  Food Insecurity: No Food Insecurity (05/26/2021)   Hunger Vital Sign    Worried About Running Out of Food in the Last Year: Never true    Ran Out of Food in the Last Year: Never true  Transportation Needs: No Transportation Needs (05/26/2021)   PRAPARE - Hydrologist (Medical): No    Lack of Transportation (Non-Medical): No  Physical Activity: Insufficiently Active (05/26/2021)   Exercise Vital Sign    Days of Exercise per Week: 5 days    Minutes of Exercise per Session: 20 min  Stress: No Stress Concern Present (05/26/2021)   [place]    Feeling of Stress : Not at all  Social Connections: Socially Isolated (05/26/2021)   Social Connection and Isolation Panel [NHANES]    Frequency of Communication with Friends and Family: More than three times a week    Frequency of Social Gatherings with Friends and Family: Twice a week    Attends Religious Services: Never    Marine scientist or Organizations: No    Attends Archivist Meetings: Never    Marital Status: Divorced  Human resources officer Violence: Not At Risk (05/26/2021)   Humiliation, Afraid, Rape, and  Kick questionnaire    Fear of Current or Ex-Partner: No    Emotionally Abused: No    Physically Abused: No    Sexually Abused: No    Review of Systems   Objective:   Vitals:   04/05/22 0945  BP: 122/74  Pulse: 72  Temp: 98.2 F (36.8 C)  TempSrc: Temporal  SpO2: 97%  Weight: 171 lb (77.6 kg)  Height: 5\' 6"  (1.676 m)     Physical Exam Vitals reviewed.  Constitutional:      General: He is not in acute distress.    Appearance: Normal appearance. He is well-developed. He is not ill-appearing, toxic-appearing or diaphoretic.  HENT:     Head: Normocephalic and atraumatic.     Mouth/Throat:     Mouth: Mucous membranes are moist.     Pharynx: No oropharyngeal exudate or posterior oropharyngeal erythema.     Comments: Diffuse tongue swelling, left greater than right.  Prominent uvula, but midline.  No apparent peritonsillar swelling/abscess.  He is clearing secretions and speaking with normal voice.  No stridor. Neck:     Vascular: No carotid bruit or JVD.  Cardiovascular:     Rate and Rhythm: Normal rate and regular rhythm.     Heart sounds: Normal heart sounds. No murmur heard. Pulmonary:     Effort: Pulmonary effort is normal. No respiratory distress.     Breath sounds: Normal breath sounds. No stridor. No wheezing or rales.  Musculoskeletal:     Cervical back: No rigidity or tenderness.     Right lower leg: No edema.     Left lower leg: No edema.  Lymphadenopathy:     Cervical: No cervical adenopathy.  Skin:    General: Skin is warm and dry.  Neurological:     Mental Status: He is alert and oriented to person, place, and time.  Psychiatric:        Mood and Affect: Mood normal.   10:29 AM Discussed angioedema with patient, and although he does report tongue swelling has improved I am concerned with the throat symptoms  and possible progression to airway.  No airway compromise at this time as he is clearing secretions, normal voice, no stridor.  EMS transport  discussed, that was declined.  He does agree to be seen in the ER and will travel by private vehicle.  911 precautions given if any change in symptoms and route.  Plans to be seen at [facility] nearby.  Charge nurse advised by phone.   Assessment & Plan:  [name] Benitez is a 67 y.o. male . Angioedema, initial encounter  -Angioedema, initially tongue with improvement in tongue swelling today but now with sensation of throat swelling, posterior oropharynx symptoms.  He is clearing secretions, speaking normally, no sign of airway compromise at this time but concern for progression with angioedema.  ER evaluation discussed, EMS recommended, refused as above but will go by private vehicle with 911 precautions.Marland Kitchen  Charge nurse at ER advised.  Likely ACE inhibitor angioedema, no meds given in office.  Benazepril discontinued, avoid ACE inhibitors in the future.  No orders of the defined types were placed in this encounter.  Patient Instructions  Your symptoms are likely due to a condition called angioedema and most likely culprit is the benazepril.  Do not take any further doses of that medicine or any ACE inhibitor blood pressure medicines in the future.  Because of the throat symptoms, go straight to the emergency room for further evaluation and monitoring. If any acute worsening of symptoms on the way to the emergency room pull over and call 911.  [place] at [facility] Address: [address] [place], [place] [address]  Angioedema Angioedema is the sudden swelling of tissue in the body. Angioedema can affect any part of the body, including the legs, hands, genitals, face, mouth, lips, and internal organs, like your intestines. Depending on the cause, angioedema may happen just once. However, some people may have repeated bouts of angioedema during their lives. Symptoms may be mild and may occur along with other allergic symptoms such as itchy, red,  swollen areas of skin (hives). Severe angioedema can be life-threatening if it affects the air passages and blocks breathing. What are the causes? This condition may be caused by: Foods, such as milk, eggs, shellfish, wheat, or nuts. Certain medicines, such as ACE inhibitors, birth control pills, dyes used in X-rays, or NSAIDs, such as ibuprofen. Hereditary angioedema (HAE) is genetic. Episodes can be triggered by: Illness, infection, or emotional or physical stress. Changes in hormone levels. Exercise. Minor surgical or dental procedures. In some cases, the cause of this condition is not known. What increases the risk? You are more likely to develop HAE if you have family members with this condition.  What are the signs or symptoms?  Symptoms of this condition depend on where the swelling happens.  Symptoms of this condition include: Swollen skin. Hives. Pain, pressure, or tenderness in the affected area. Swollen eyelids, face, lips, or tongue. Trouble drinking, swallowing, or closing the mouth completely. Hoarseness or sore throat. Wheezing or trouble breathing. If your internal organs are affected, symptoms may also include: Nausea. Pain in the abdomen. Vomiting or diarrhea. Trouble swallowing. Trouble passing urine. How is this diagnosed? This condition may be diagnosed based on: An exam of the affected area. Your medical history. Whether anyone in your family has had this condition before. A review of any medicines you have been taking. Tests, including: Allergy skin tests to see if the condition was caused by an allergic reaction. Blood tests to see  if the condition was caused by certain inherited or genetic diseases. How is this treated? Treatment for this condition depends on the cause and severity of your symptoms. It may involve any of the following: Avoiding triggers, if they are known. Triggers may include foods or environmental allergens. Stopping medicines  permanently if they cause the condition. These include ACE inhibitors. Taking medicines to treat symptoms or prevent future episodes. These may include: Antihistamines. Epinephrine injections. Steroids. Blood products to treat specific types of non-allergic angioedema. Breathing tubes or ventilators in severe cases in which breathing is affected. Severe cases of angioedema are treated at the hospital. Mild to moderate angioedema usually gets better in 24-48 hours. Follow these instructions at home:  Take over-the-counter and prescription medicines only as told by your health care provider. If you were given medicines for emergency allergy treatment, always carry them with you. This includes epinephrine injector kits. Wear a medical bracelet as told by your health care provider. If something triggers your condition, avoid the trigger. Triggers can be foods, environmental allergens, stress, or exercise. Avoid all medicines that caused your angioedema. This is for your entire life. If your condition is inherited and you are thinking about having children, talk to your health care provider. It is important to discuss the risks of passing on the condition to your children. Where to find more information American Academy of Allergy Asthma & Immunology: www.aaaai.org Contact a health care provider if: You continue to have repeated episodes of angioedema. Episodes of angioedema start to happen more often than they used to, even after you take steps to prevent them. You have episodes of angioedema that are more severe than they have been before, even after you take steps to prevent them. You are thinking about having children. Get help right away if: You have severe swelling of your mouth, tongue, or lips. Your swelling gets worse. You have trouble breathing, swallowing, or talking. You have chest pain, dizziness or light-headedness, or you pass out. These symptoms may represent a serious problem  that is an emergency. Do not wait to see if the symptoms will go away. Get medical help right away. Call your local emergency services (911 in the [place].S[place] Do not drive yourself to the hospital. Summary Angioedema is the sudden swelling of tissues. It is important to be aware of all triggers or causes for your angioedema and to avoid them. Treatment for this condition depends on the cause and severity of your symptoms. Severe angioedema can be life-threatening if it blocks the air passages. This information is not intended to replace advice given to you by your health care provider. Make sure you discuss any questions you have with your health care provider. Document Revised: 04/21/2020 Document Reviewed: 04/21/2020 Elsevier Patient Education  [place],   Merri Ray, MD [place], [facility] 04/05/22 10:29 AM

## 2022-04-05 NOTE — Telephone Encounter (Signed)
Please schedule ER follow-up in the next 2 days so we can reassess his tongue swelling and blood pressure, especially as he was started on prednisone in the ER and we did stop his benazepril for possible allergy today.  I do not see that they added any blood pressure medication at his ER visit today.

## 2022-04-05 NOTE — Discharge Instructions (Addendum)
Evaluation for your tongue swelling was overall reassuring.  It is hard to know what was causing the swelling but we will treat for an allergic reaction.  I have sent famotidine and prednisone to your pharmacy.  Please take as prescribed.  Also recommend you follow-up with your PCP for reevaluation.  If you have worsening tongue or airway swelling, shortness of breath, drooling or any other concerning symptom please return the emergency department for further evaluation.

## 2022-04-05 NOTE — Patient Instructions (Addendum)
Your symptoms are likely due to a condition called angioedema and most likely culprit is the benazepril.  Do not take any further doses of that medicine or any ACE inhibitor blood pressure medicines in the future.  Because of the throat symptoms, go straight to the emergency room for further evaluation and monitoring. If any acute worsening of symptoms on the way to the emergency room pull over and call 911.  [place] at [facility] Address: [address] [place], [place] [address]  Angioedema Angioedema is the sudden swelling of tissue in the body. Angioedema can affect any part of the body, including the legs, hands, genitals, face, mouth, lips, and internal organs, like your intestines. Depending on the cause, angioedema may happen just once. However, some people may have repeated bouts of angioedema during their lives. Symptoms may be mild and may occur along with other allergic symptoms such as itchy, red, swollen areas of skin (hives). Severe angioedema can be life-threatening if it affects the air passages and blocks breathing. What are the causes? This condition may be caused by: Foods, such as milk, eggs, shellfish, wheat, or nuts. Certain medicines, such as ACE inhibitors, birth control pills, dyes used in X-rays, or NSAIDs, such as ibuprofen. Hereditary angioedema (HAE) is genetic. Episodes can be triggered by: Illness, infection, or emotional or physical stress. Changes in hormone levels. Exercise. Minor surgical or dental procedures. In some cases, the cause of this condition is not known. What increases the risk? You are more likely to develop HAE if you have family members with this condition.  What are the signs or symptoms?  Symptoms of this condition depend on where the swelling happens.  Symptoms of this condition include: Swollen skin. Hives. Pain, pressure, or tenderness in the affected area. Swollen eyelids, face, lips, or  tongue. Trouble drinking, swallowing, or closing the mouth completely. Hoarseness or sore throat. Wheezing or trouble breathing. If your internal organs are affected, symptoms may also include: Nausea. Pain in the abdomen. Vomiting or diarrhea. Trouble swallowing. Trouble passing urine. How is this diagnosed? This condition may be diagnosed based on: An exam of the affected area. Your medical history. Whether anyone in your family has had this condition before. A review of any medicines you have been taking. Tests, including: Allergy skin tests to see if the condition was caused by an allergic reaction. Blood tests to see if the condition was caused by certain inherited or genetic diseases. How is this treated? Treatment for this condition depends on the cause and severity of your symptoms. It may involve any of the following: Avoiding triggers, if they are known. Triggers may include foods or environmental allergens. Stopping medicines permanently if they cause the condition. These include ACE inhibitors. Taking medicines to treat symptoms or prevent future episodes. These may include: Antihistamines. Epinephrine injections. Steroids. Blood products to treat specific types of non-allergic angioedema. Breathing tubes or ventilators in severe cases in which breathing is affected. Severe cases of angioedema are treated at the hospital. Mild to moderate angioedema usually gets better in 24-48 hours. Follow these instructions at home:  Take over-the-counter and prescription medicines only as told by your health care provider. If you were given medicines for emergency allergy treatment, always carry them with you. This includes epinephrine injector kits. Wear a medical bracelet as told by your health care provider. If something triggers your condition, avoid the trigger. Triggers can be foods, environmental allergens, stress, or exercise. Avoid all medicines that caused your  angioedema. This is for your entire life. If your condition is inherited and you are thinking about having children, talk to your health care provider. It is important to discuss the risks of passing on the condition to your children. Where to find more information American Academy of Allergy Asthma & Immunology: www.aaaai.org Contact a health care provider if: You continue to have repeated episodes of angioedema. Episodes of angioedema start to happen more often than they used to, even after you take steps to prevent them. You have episodes of angioedema that are more severe than they have been before, even after you take steps to prevent them. You are thinking about having children. Get help right away if: You have severe swelling of your mouth, tongue, or lips. Your swelling gets worse. You have trouble breathing, swallowing, or talking. You have chest pain, dizziness or light-headedness, or you pass out. These symptoms may represent a serious problem that is an emergency. Do not wait to see if the symptoms will go away. Get medical help right away. Call your local emergency services (911 in the [place] Do not drive yourself to the hospital. Summary Angioedema is the sudden swelling of tissues. It is important to be aware of all triggers or causes for your angioedema and to avoid them. Treatment for this condition depends on the cause and severity of your symptoms. Severe angioedema can be life-threatening if it blocks the air passages. This information is not intended to replace advice given to you by your health care provider. Make sure you discuss any questions you have with your health care provider. Document Revised: 04/21/2020 Document Reviewed: 04/21/2020 Elsevier Patient Education  [place].

## 2022-04-05 NOTE — ED Triage Notes (Signed)
Onset yesterday states thong swelling up.  Noted thong swollen.  States having no SOB.  Speaking without difficulty  States swelling has gone down

## 2022-04-05 NOTE — ED Provider Notes (Signed)
[facility] Provider Note   CSN: [id number] Arrival date & time: 04/05/22  1043     History  Chief Complaint  Patient presents with   Oral Swelling   HPI [name] Benitez is a 67 y.o. male with history of CAD status post CABG presenting for oral swelling.  Started around 2 PM yesterday.  Swelling located in the left side of the tongue.  Also endorses associated throat swelling.  States he initially had some drooling but never had any trouble breathing or swallowing.  Denies any recent med changes.  States he was working outside in the yard a good bit yesterday.  States this following his drastically improved since yesterday almost completely gone.  Not on ACE inhibitor.  HPI     Home Medications Prior to Admission medications   Medication Sig Start Date End Date Taking? Authorizing Provider  famotidine (PEPCID) 20 MG tablet Take 1 tablet (20 mg total) by mouth 2 (two) times daily for 7 days. 04/05/22 04/12/22 Yes Harriet Pho, PA-C  predniSONE (DELTASONE) 10 MG tablet Take 2 tablets (20 mg total) by mouth daily for 5 days. 04/05/22 04/10/22 Yes Harriet Pho, PA-C  acetaminophen (TYLENOL) 500 MG tablet Take 500 mg by mouth 2 (two) times daily.    [provider]  aspirin EC 81 MG tablet Take 81 mg by mouth daily.    [provider]  Difluprednate 0.05 % EMUL Apply to eye. 05/02/21   [provider]  Doxylamine Succinate, Sleep, (SLEEP AID PO) Take by mouth daily. 2 tablets at bedtime.    [provider]  fish oil-omega-3 fatty acids 1000 MG capsule Take 1 g by mouth 2 (two) times daily.     [provider]  ketorolac (ACULAR) 0.5 % ophthalmic solution  05/02/21   [provider]  metoprolol succinate (TOPROL-XL) 50 MG 24 hr tablet TAKE 1 TABLET BY MOUTH EVERY DAY WITH OR IMMEDIATELY FOLLOWING A MEAL 09/09/21   Wendie Agreste, MD  moxifloxacin (VIGAMOX) 0.5 % ophthalmic solution Apply to  eye. 05/02/21   [provider]  RABEprazole (ACIPHEX) 20 MG tablet Take 1 tablet (20 mg total) by mouth daily. 09/09/21   Wendie Agreste, MD  sildenafil (VIAGRA) 100 MG tablet Take 0.5-1 tablets (50-100 mg total) by mouth daily as needed for erectile dysfunction. 09/09/21   Wendie Agreste, MD  simvastatin (ZOCOR) 40 MG tablet TAKE 1 TABLET BY MOUTH EVERY DAY 02/25/21   Leonie Man, MD  valACYclovir (VALTREX) 500 MG tablet Take 1 tablet (500 mg total) by mouth daily. Increase to BID dosing for 3 days if symptoms of flare. 01/18/22   Wendie Agreste, MD      Allergies    Ace inhibitors    Review of Systems   See HPI for pertinent positives   Physical Exam   Vitals:   04/05/22 1053  BP: (!) 139/107  Pulse: 77  Resp: 16  Temp: 97.9 F (36.6 C)  SpO2: 98%    CONSTITUTIONAL:  well-appearing, NAD NEURO:  Alert and oriented x 3, CN 3-12 grossly intact EYES:  eyes equal and reactive ENT/NECK:  Supple, no stridor, no swelling noted in the neck.  Mild edema noted left lateral aspect of the tongue. CARDIO:  regular rate and rhythm, appears well-perfused  PULM:  No respiratory distress, CTAB GI/GU:  non-distended, soft MSK/SPINE:  No gross deformities, no edema, moves all extremities  SKIN:  no rash,  atraumatic  *Additional and/or pertinent findings included in MDM below  ED Results / Procedures / Treatments   Labs (all labs ordered are listed, but only abnormal results are displayed) Labs Reviewed - No data to display  EKG None  Radiology No results found.  Procedures Procedures    Medications Ordered in ED Medications  diphenhydrAMINE (BENADRYL) 12.5 MG/5ML elixir 25 mg (25 mg Oral Given 04/05/22 1132)  methylPREDNISolone sodium succinate (SOLU-MEDROL) 125 mg/2 mL injection 125 mg (125 mg Intravenous Given 04/05/22 1133)  famotidine (PEPCID) tablet 20 mg (20 mg Oral Given 04/05/22 1133)    ED Course/ Medical Decision Making/ A&P                              Medical Decision Making Risk Prescription drug management.   67 year old well-appearing male presenting for swelling in his tongue.  Exam notable for mild swelling in the left lateral aspect of his tongue.  Overall patient looks clinically well without evidence of respiratory distress.  DDx includes upper airway obstruction, anaphylaxis, angioedema, respiratory distress.  Initially concern for angioedema but unlikely given that patient is not on lisinopril and has no other recent med changes.  Treated for anaphylaxis with Pepcid, Solu-Medrol and Benadryl.  After treatment patient stated he no longer felt like his throat was swelling.  He continued to deny any respiratory distress or drooling.  Continue to look very well-appearing without evidence of respiratory stress.  Fluid challenge without any complication.  Sent home with 5-day course of prednisone and 7-day course of Pepcid.  Advised to follow-up with his PCP.  Discussed pertinent return precautions.        Final Clinical Impression(s) / ED Diagnoses Final diagnoses:  Mild tongue swelling    Rx / DC Orders ED Discharge Orders          Ordered    famotidine (PEPCID) 20 MG tablet  2 times daily        04/05/22 1253    predniSONE (DELTASONE) 10 MG tablet  Daily        04/05/22 1253              Gareth Eagle, PA-C 04/05/22 1259    Vanetta Mulders, MD 04/07/22 [phone]

## 2022-04-06 ENCOUNTER — Encounter: Payer: Self-pay | Admitting: Family Medicine

## 2022-04-06 ENCOUNTER — Ambulatory Visit (INDEPENDENT_AMBULATORY_CARE_PROVIDER_SITE_OTHER): Payer: Medicare HMO | Admitting: Family Medicine

## 2022-04-06 VITALS — BP 120/78 | HR 88 | Temp 98.4°F | Ht 66.0 in | Wt 170.8 lb

## 2022-04-06 DIAGNOSIS — I10 Essential (primary) hypertension: Secondary | ICD-10-CM | POA: Diagnosis not present

## 2022-04-06 DIAGNOSIS — T783XXD Angioneurotic edema, subsequent encounter: Secondary | ICD-10-CM

## 2022-04-06 MED ORDER — LOSARTAN POTASSIUM 25 MG PO TABS
25.0000 mg | ORAL_TABLET | Freq: Every day | ORAL | 1 refills | Status: DC
Start: 2022-04-06 — End: 2022-09-27

## 2022-04-06 NOTE — Progress Notes (Signed)
Subjective:  Patient ID: [name] Benitez, male    DOB: 10-11-1955  Age: 67 y.o. MRN: [id number]  CC:  Chief Complaint  Patient presents with   Follow-up    Pt states he is feeling better swelling of tongue has went down. States he still has a scratch in his throat. Wants to discuss when would be the best time of the day to take medication prescribed for swelling.    HPI [name] Benitez presents for  Follow-up of tongue swelling, sensation of throat swelling Seen yesterday in office.  Symptoms for started day prior, with left greater than right tongue swelling, progressive overnight then improving at office visit.  However he also had been noting some tightness/swelling feeling in the back of his throat and some discomfort with swallowing.  Sent to ER for suspected angioedema.  Benazepril discontinued from his medication list and ACE inhibitor is added to his allergy.   ER note reviewed -  was given 5-day course of prednisone prednisone and 7 days Pepcid outpatient after initial Pepcid, Benadryl, Solu-Medrol and monitoring with improved symptoms.  Fluid challenge without complication.  No additional antihypertensives prescribed.  Blood pressure was slightly elevated in the ER at 139/107.  Since yesterday - doing better. Slight scratchy throat but feels like from hay fever. Tongue swelling better. Drinking, eating. Swallowing ok. No hives/rash.  Taking famotidine BID and prednisone 40mg  - taking 20mg  BID for 5 days.    Home BP - none.    History Patient Active Problem List   Diagnosis Date Noted   Encounter for Department of Transportation (DOT) examination for driving license renewal [id number]   Nocturia 05/08/2013   Atherosclerosis of native coronary artery without angina pectoris    Essential hypertension    Dyslipidemia, at goal LDL below 70    Erectile dysfunction    CAD  - S/P urgent CABG x 2 for subtotal occlusion of the LAD in a stented segment that was unable to be  recanalized percutaneously 04/02/2009   Past Medical History:  Diagnosis Date   CAD in native artery    Essentially LAD occlusion with in-stent restenosis/thrombosis.  50% PDA--referred for CABG x2.   Coronary stent restenosis due to progression of disease 04/2009   Subtotal occlusion of the LAD with ISR, unable to cross with wire; aneurysmal dilation of the LAD prior to stent   Current occasional smoker    He initially quit around the time of his CABG, but he has gotten back to smoking maybe 1 or 2 a day as a stress relief.  Does not smoke every day.   Dyslipidemia, goal LDL below 70    Erectile dysfunction    History of GI bleed    No recurrence   History of vertigo    Hypertension    S/P CABG x 03 April 2009   LIMA-LAD, SVG-RPDA (to bypass a roughly 50% lesion); post CABG stress normal with no ischemia   ST elevation (STEMI) myocardial infarction involving left anterior descending coronary artery October 2005   LAD PCI with 2 overlapping Cypher DES 3.0 mm 23 mm   Past Surgical History:  Procedure Laterality Date   CARDIAC CATHETERIZATION  04/28/2009   Subtotal occlusion of mid LAD with ISR/thrombosis, unable to pass --> urgent CABG (Dr. Pecola Lawless)   cataract surgery  02/2022   COLONOSCOPY  2005   CORONARY ANGIOPLASTY WITH STENT PLACEMENT  10/26/2003   anterior ischemia with significant reversibility (mid anterior and anteroseptal, apical anterior and  apical anteroseptal)  - Cypher 3x93mm stent to mid LAD and Cypher 3x80mm Cypher DES to prox LAD(Dr. Gerrie Nordmann)   CORONARY ARTERY BYPASS GRAFT  04/2009   LIMA-LAD, reverse SVG-distal RCA (Dr. Chauncey Cruel. Hendrickson)   DOPPLER ECHOCARDIOGRAPHY  04/2009   EF 50-50%, borderline septal hypokinesis   Exercise Tolerance Test  06/21/2013   Exercise 8:05 Min, 10.1 METs, reached 99% max. Peak HR -- 160 bpm; NEGATIVE Bruce TM GXT = no EKG evidence of ischemia. Prolonged heart rate and blood pressure recovery and PVCs noted that resolve with Exerction.    NM MYOVIEW LTD  05/2018   EF 45%.  Low risk.  No ischemia or infarction.   Allergies  Allergen Reactions   Ace Inhibitors Other (See Comments)    Angioedema 04/05/22.    Prior to Admission medications   Medication Sig Start Date End Date Taking? Authorizing Provider  acetaminophen (TYLENOL) 500 MG tablet Take 500 mg by mouth 2 (two) times daily.    [provider]  aspirin EC 81 MG tablet Take 81 mg by mouth daily.    [provider]  Difluprednate 0.05 % EMUL Apply to eye. 05/02/21   [provider]  Doxylamine Succinate, Sleep, (SLEEP AID PO) Take by mouth daily. 2 tablets at bedtime.    [provider]  famotidine (PEPCID) 20 MG tablet Take 1 tablet (20 mg total) by mouth 2 (two) times daily for 7 days. 04/05/22 04/12/22  Harriet Pho, PA-C  fish oil-omega-3 fatty acids 1000 MG capsule Take 1 g by mouth 2 (two) times daily.     [provider]  ketorolac (ACULAR) 0.5 % ophthalmic solution  05/02/21   [provider]  metoprolol succinate (TOPROL-XL) 50 MG 24 hr tablet TAKE 1 TABLET BY MOUTH EVERY DAY WITH OR IMMEDIATELY FOLLOWING A MEAL 09/09/21   Wendie Agreste, MD  moxifloxacin (VIGAMOX) 0.5 % ophthalmic solution Apply to eye. 05/02/21   [provider]  predniSONE (DELTASONE) 10 MG tablet Take 2 tablets (20 mg total) by mouth daily for 5 days. 04/05/22 04/10/22  Harriet Pho, PA-C  RABEprazole (ACIPHEX) 20 MG tablet Take 1 tablet (20 mg total) by mouth daily. 09/09/21   Wendie Agreste, MD  sildenafil (VIAGRA) 100 MG tablet Take 0.5-1 tablets (50-100 mg total) by mouth daily as needed for erectile dysfunction. 09/09/21   Wendie Agreste, MD  simvastatin (ZOCOR) 40 MG tablet TAKE 1 TABLET BY MOUTH EVERY DAY 02/25/21   Leonie Man, MD  valACYclovir (VALTREX) 500 MG tablet Take 1 tablet (500 mg total) by mouth daily. Increase to BID dosing for 3 days if symptoms of flare. 01/18/22   Wendie Agreste, MD   Social History    Socioeconomic History   Marital status: Divorced    Spouse name: Not on file   Number of children: 1   Years of education: 11   Highest education level: Not on file  Occupational History    Employer: [employer]  Tobacco Use   Smoking status: Every Day    Packs/day: 0.50    Years: 50.00    Additional pack years: 0.00    Total pack years: 25.00    Types: Cigarettes   Smokeless tobacco: Never   Tobacco comments:    12 Cigarettes daily; last attempt at quitting 2005  Vaping Use   Vaping Use: Never used  Substance and Sexual Activity   Alcohol use: Yes    Comment: occas  Drug use: No   Sexual activity: Yes    Birth control/protection: Condom  Other Topics Concern   Not on file  Social History Narrative   Divorced father of one, grandfather 2. No longer smokes -- quit in 2005. Works for [employer] in [place]. Does not get routine activity/exercise -- Basically, he is not motivated to do any exercise. He, by himself, is admittedly lazy. Does not drink alcohol.   Social Determinants of Health   Financial Resource Strain: Low Risk  (05/26/2021)   Overall Financial Resource Strain (CARDIA)    Difficulty of Paying Living Expenses: Not hard at all  Food Insecurity: No Food Insecurity (05/26/2021)   Hunger Vital Sign    Worried About Running Out of Food in the Last Year: Never true    Ran Out of Food in the Last Year: Never true  Transportation Needs: No Transportation Needs (05/26/2021)   PRAPARE - Hydrologist (Medical): No    Lack of Transportation (Non-Medical): No  Physical Activity: Insufficiently Active (05/26/2021)   Exercise Vital Sign    Days of Exercise per Week: 5 days    Minutes of Exercise per Session: 20 min  Stress: No Stress Concern Present (05/26/2021)   [place]    Feeling of Stress : Not at all  Social Connections: Socially Isolated  (05/26/2021)   Social Connection and Isolation Panel [NHANES]    Frequency of Communication with Friends and Family: More than three times a week    Frequency of Social Gatherings with Friends and Family: Twice a week    Attends Religious Services: Never    Marine scientist or Organizations: No    Attends Archivist Meetings: Never    Marital Status: Divorced  Human resources officer Violence: Not At Risk (05/26/2021)   Humiliation, Afraid, Rape, and Kick questionnaire    Fear of Current or Ex-Partner: No    Emotionally Abused: No    Physically Abused: No    Sexually Abused: No    Review of Systems Per hpi.   Objective:   Vitals:   04/06/22 0853  BP: 120/78  Pulse: 88  Temp: 98.4 F (36.9 C)  TempSrc: Temporal  SpO2: 98%  Weight: 170 lb 12.8 oz (77.5 kg)  Height: 5\' 6"  (1.676 m)     Physical Exam Vitals reviewed.  Constitutional:      Appearance: He is well-developed.  HENT:     Head: Normocephalic and atraumatic.     Mouth/Throat:     Comments: Slight swelling left tongue. Clearing secretions, normal speech, no stridor and no appreciable swelling of posterior oropharynx.  Neck:     Vascular: No carotid bruit or JVD.  Cardiovascular:     Rate and Rhythm: Normal rate and regular rhythm.     Heart sounds: Normal heart sounds. No murmur heard. Pulmonary:     Effort: Pulmonary effort is normal. No respiratory distress.     Breath sounds: Normal breath sounds. No stridor. No wheezing or rales.  Musculoskeletal:     Right lower leg: No edema.     Left lower leg: No edema.  Skin:    General: Skin is warm and dry.  Neurological:     Mental Status: He is alert and oriented to person, place, and time.  Psychiatric:        Mood and Affect: Mood normal.       Assessment & Plan:  [name] Benitez is a 67 y.o. male . Angioedema, subsequent encounter  -Improving, suspected ACE inhibitor angioedema, ACE inhibitor was added to allergy list and benazepril was  discontinued yesterday.  Symptoms improving with prednisone, famotidine, continue with ER precautions given if any worsening of symptoms.  Should continue to improve with cessation of ACE inhibitor.  Essential hypertension - Plan: losartan (COZAAR) 25 MG tablet  -Off benazepril, BP okay today, lower than ER visit.  Start low-dose losartan with home monitoring, has follow-up with me on April 29.  No other med changes for now.  Meds ordered this encounter  Medications   losartan (COZAAR) 25 MG tablet    Sig: Take 1 tablet (25 mg total) by mouth daily.    Dispense:  90 tablet    Refill:  1   Patient Instructions  Glad to hear that the tongue swelling and throat symptoms are improving.  Continue famotidine twice per day and the prednisone that was prescribed by the emergency room.  If any worsening swelling or difficulty swallowing, be seen immediately in the ER.  I do not expect this to occur.  No further ACE inhibitor blood pressure medications as that could have been the cause.  I did write you for a new blood pressure medicine, losartan which is unlikely to cause swelling.  It works similar to the benazepril.  Keep follow-up with me on the 29th but if any lightheadedness, low blood pressures or elevated readings in the meantime follow-up sooner.  Let me know if you have questions and take care!    Signed,   Merri Ray, MD [place] 04/06/22 9:36 AM

## 2022-04-06 NOTE — Patient Instructions (Signed)
Glad to hear that the tongue swelling and throat symptoms are improving.  Continue famotidine twice per day and the prednisone that was prescribed by the emergency room.  If any worsening swelling or difficulty swallowing, be seen immediately in the ER.  I do not expect this to occur.  No further ACE inhibitor blood pressure medications as that could have been the cause.  I did write you for a new blood pressure medicine, losartan which is unlikely to cause swelling.  It works similar to the benazepril.  Keep follow-up with me on the 29th but if any lightheadedness, low blood pressures or elevated readings in the meantime follow-up sooner.  Let me know if you have questions and take care!

## 2022-04-06 NOTE — Telephone Encounter (Signed)
Patient is scheduled for today.  

## 2022-04-09 ENCOUNTER — Other Ambulatory Visit: Payer: Self-pay | Admitting: Family Medicine

## 2022-04-10 ENCOUNTER — Telehealth: Payer: Self-pay

## 2022-04-10 NOTE — Telephone Encounter (Signed)
Refilled

## 2022-04-10 NOTE — Telephone Encounter (Signed)
     Patient  visit on 04/05/2022  at [facility] was for mild tongue swelling.  Have you been able to follow up with your primary care physician? Yes  The patient was or was not able to obtain any needed medicine or equipment. Patient was able to obtain medication.  Are there diet recommendations that you are having difficulty following? No  Patient expresses understanding of discharge instructions and education provided has no other needs at this time. Yes   [name] Benitez [facility]  [facility] Population Health Community Resource Care Guide   [email or web address]  ?? [phone]   Website: [email or web address]  [email or web address]

## 2022-04-10 NOTE — Telephone Encounter (Signed)
Would you like to refill this medication ? Dr Bryan Lemma was the last to refill it

## 2022-04-26 ENCOUNTER — Other Ambulatory Visit: Payer: Self-pay | Admitting: Cardiology

## 2022-05-01 ENCOUNTER — Ambulatory Visit (INDEPENDENT_AMBULATORY_CARE_PROVIDER_SITE_OTHER): Payer: Medicare HMO | Admitting: Family Medicine

## 2022-05-01 ENCOUNTER — Encounter: Payer: Self-pay | Admitting: Family Medicine

## 2022-05-01 VITALS — BP 122/72 | HR 77 | Temp 98.3°F | Ht 66.0 in | Wt 164.7 lb

## 2022-05-01 DIAGNOSIS — T783XXD Angioneurotic edema, subsequent encounter: Secondary | ICD-10-CM | POA: Diagnosis not present

## 2022-05-01 DIAGNOSIS — I10 Essential (primary) hypertension: Secondary | ICD-10-CM

## 2022-05-01 NOTE — Progress Notes (Signed)
Subjective:  Patient ID: [name] Benitez, male    DOB: 06-Sep-1955  Age: 67 y.o. MRN: [id number]  CC:  Chief Complaint  Patient presents with   Follow-up    HPI [name] Benitez presents for   Hypertension: With history of CAD status post CABG.  Medications adjusted recently due to suspected angioedema with benazepril.  Discontinued and ACE inhibitor is added to his allergy list.  Treated in ER with prednisone, famotidine, Benadryl, Solu-Medrol and improved at his April 4 visit.  Started on losartan 25 mg daily with ER precautions given. No return of swelling or throat symptoms. No wheezing/dyspnea.  Taking losartan 25mg  qd, toprol XL 50mg  qd.  Did pick up benazepril - 30 day supply recently sent in from other office. Taking benazepril past few days - has not had return for swelling. Stopped another pill - not sure which one.    Home readings: BP Readings from Last 3 Encounters:  05/01/22 122/72  04/06/22 120/78  04/05/22 (!) 139/107   Lab Results  Component Value Date   CREATININE 0.93 04/04/2022       History Patient Active Problem List   Diagnosis Date Noted   Encounter for Department of Transportation (DOT) examination for driving license renewal 40/98/1191   Nocturia 05/08/2013   Atherosclerosis of native coronary artery without angina pectoris    Essential hypertension    Dyslipidemia, at goal LDL below 70    Erectile dysfunction    CAD  - S/P urgent CABG x 2 for subtotal occlusion of the LAD in a stented segment that was unable to be recanalized percutaneously 04/02/2009   Past Medical History:  Diagnosis Date   CAD in native artery    Essentially LAD occlusion with in-stent restenosis/thrombosis.  50% PDA--referred for CABG x2.   Coronary stent restenosis due to progression of disease 04/2009   Subtotal occlusion of the LAD with ISR, unable to cross with wire; aneurysmal dilation of the LAD prior to stent   Current occasional smoker    He initially quit  around the time of his CABG, but he has gotten back to smoking maybe 1 or 2 a day as a stress relief.  Does not smoke every day.   Dyslipidemia, goal LDL below 70    Erectile dysfunction    History of GI bleed    No recurrence   History of vertigo    Hypertension    S/P CABG x 03 April 2009   LIMA-LAD, SVG-RPDA (to bypass a roughly 50% lesion); post CABG stress normal with no ischemia   ST elevation (STEMI) myocardial infarction involving left anterior descending coronary artery [facility]) October 2005   LAD PCI with 2 overlapping Cypher DES 3.0 mm 23 mm   Past Surgical History:  Procedure Laterality Date   CARDIAC CATHETERIZATION  04/28/2009   Subtotal occlusion of mid LAD with ISR/thrombosis, unable to pass --> urgent CABG (Dr. Mariel Sleet)   cataract surgery  02/2022   COLONOSCOPY  2005   CORONARY ANGIOPLASTY WITH STENT PLACEMENT  10/26/2003   anterior ischemia with significant reversibility (mid anterior and anteroseptal, apical anterior and apical anteroseptal)  - Cypher 3x64mm stent to mid LAD and Cypher 3x36mm Cypher DES to prox LAD(Dr. Laurell Josephs)   CORONARY ARTERY BYPASS GRAFT  04/2009   LIMA-LAD, reverse SVG-distal RCA (Dr. Kathie Rhodes. Hendrickson)   DOPPLER ECHOCARDIOGRAPHY  04/2009   EF 50-50%, borderline septal hypokinesis   Exercise Tolerance Test  06/21/2013   Exercise 8:05 Min, 10.1 METs,  reached 99% max. Peak HR -- 160 bpm; NEGATIVE Bruce TM GXT = no EKG evidence of ischemia. Prolonged heart rate and blood pressure recovery and PVCs noted that resolve with Exerction.   NM MYOVIEW LTD  05/2018   EF 45%.  Low risk.  No ischemia or infarction.   Allergies  Allergen Reactions   Ace Inhibitors Other (See Comments)    Angioedema 04/05/22.    Prior to Admission medications   Medication Sig Start Date End Date Taking? Authorizing Provider  acetaminophen (TYLENOL) 500 MG tablet Take 500 mg by mouth 2 (two) times daily.   Yes [provider]  aspirin EC 81 MG tablet Take 81 mg by  mouth daily.   Yes [provider]  benazepril (LOTENSIN) 20 MG tablet TAKE 1 TABLET (20 MG TOTAL) BY MOUTH DAILY. PLEASE CONTACT THE OFFICE TO SCHEDULE APPOINTMENT 04/26/22  Yes Marykay Lex, MD  Difluprednate 0.05 % EMUL Apply to eye. 05/02/21  Yes [provider]  Doxylamine Succinate, Sleep, (SLEEP AID PO) Take by mouth daily. 2 tablets at bedtime.   Yes [provider]  fish oil-omega-3 fatty acids 1000 MG capsule Take 1 g by mouth 2 (two) times daily.    Yes [provider]  ketorolac (ACULAR) 0.5 % ophthalmic solution  05/02/21  Yes [provider]  losartan (COZAAR) 25 MG tablet Take 1 tablet (25 mg total) by mouth daily. 04/06/22  Yes Shade Flood, MD  metoprolol succinate (TOPROL-XL) 50 MG 24 hr tablet TAKE 1 TABLET BY MOUTH EVERY DAY WITH OR IMMEDIATELY FOLLOWING A MEAL 09/09/21  Yes Shade Flood, MD  moxifloxacin (VIGAMOX) 0.5 % ophthalmic solution Apply to eye. 05/02/21  Yes [provider]  RABEprazole (ACIPHEX) 20 MG tablet Take 1 tablet (20 mg total) by mouth daily. 09/09/21  Yes Shade Flood, MD  sildenafil (VIAGRA) 100 MG tablet Take 0.5-1 tablets (50-100 mg total) by mouth daily as needed for erectile dysfunction. 09/09/21  Yes Shade Flood, MD  simvastatin (ZOCOR) 40 MG tablet TAKE 1 TABLET BY MOUTH EVERY DAY 04/10/22  Yes Shade Flood, MD  valACYclovir (VALTREX) 500 MG tablet Take 1 tablet (500 mg total) by mouth daily. Increase to BID dosing for 3 days if symptoms of flare. 01/18/22  Yes Shade Flood, MD  famotidine (PEPCID) 20 MG tablet Take 1 tablet (20 mg total) by mouth 2 (two) times daily for 7 days. 04/05/22 04/12/22  Gareth Eagle, PA-C   Social History   Socioeconomic History   Marital status: Divorced    Spouse name: Not on file   Number of children: 1   Years of education: 11   Highest education level: Not on file  Occupational History    Employer: [employer]  Tobacco Use    Smoking status: Every Day    Packs/day: 0.50    Years: 50.00    Additional pack years: 0.00    Total pack years: 25.00    Types: Cigarettes   Smokeless tobacco: Never   Tobacco comments:    12 Cigarettes daily; last attempt at quitting 2005  Vaping Use   Vaping Use: Never used  Substance and Sexual Activity   Alcohol use: Yes    Comment: occas   Drug use: No   Sexual activity: Yes    Birth control/protection: Condom  Other Topics Concern   Not on file  Social History Narrative   Divorced father of one, grandfather 2. No longer  smokes -- quit in 2005. Works for [employer] in [place]. Does not get routine activity/exercise -- Basically, he is not motivated to do any exercise. He, by himself, is admittedly lazy. Does not drink alcohol.   Social Determinants of Health   Financial Resource Strain: Low Risk  (05/26/2021)   Overall Financial Resource Strain (CARDIA)    Difficulty of Paying Living Expenses: Not hard at all  Food Insecurity: No Food Insecurity (05/26/2021)   Hunger Vital Sign    Worried About Running Out of Food in the Last Year: Never true    Ran Out of Food in the Last Year: Never true  Transportation Needs: No Transportation Needs (05/26/2021)   PRAPARE - Administrator, Civil Service (Medical): No    Lack of Transportation (Non-Medical): No  Physical Activity: Insufficiently Active (05/26/2021)   Exercise Vital Sign    Days of Exercise per Week: 5 days    Minutes of Exercise per Session: 20 min  Stress: No Stress Concern Present (05/26/2021)   [employer] of Occupational Health - Occupational Stress Questionnaire    Feeling of Stress : Not at all  Social Connections: Socially Isolated (05/26/2021)   Social Connection and Isolation Panel [NHANES]    Frequency of Communication with Friends and Family: More than three times a week    Frequency of Social Gatherings with Friends and Family: Twice a week    Attends Religious Services:  Never    Database administrator or Organizations: No    Attends Banker Meetings: Never    Marital Status: Divorced  Catering manager Violence: Not At Risk (05/26/2021)   Humiliation, Afraid, Rape, and Kick questionnaire    Fear of Current or Ex-Partner: No    Emotionally Abused: No    Physically Abused: No    Sexually Abused: No    Review of Systems  Constitutional:  Negative for fatigue and unexpected weight change.  Eyes:  Negative for visual disturbance.  Respiratory:  Negative for cough, chest tightness and shortness of breath.   Cardiovascular:  Negative for chest pain, palpitations and leg swelling.  Gastrointestinal:  Negative for abdominal pain and blood in stool.  Neurological:  Negative for dizziness, light-headedness and headaches.     Objective:   Vitals:   05/01/22 1600  BP: 122/72  Pulse: 77  Temp: 98.3 F (36.8 C)  SpO2: 97%  Weight: 164 lb 11.2 oz (74.7 kg)  Height: 5\' 6"  (1.676 m)     Physical Exam Vitals reviewed.  Constitutional:      Appearance: He is well-developed.  HENT:     Head: Normocephalic and atraumatic.  Neck:     Vascular: No carotid bruit or JVD.  Cardiovascular:     Rate and Rhythm: Normal rate and regular rhythm.     Heart sounds: Normal heart sounds. No murmur heard. Pulmonary:     Effort: Pulmonary effort is normal.     Breath sounds: Normal breath sounds. No rales.  Musculoskeletal:     Right lower leg: No edema.     Left lower leg: No edema.  Skin:    General: Skin is warm and dry.  Neurological:     Mental Status: He is alert and oriented to person, place, and time.  Psychiatric:        Mood and Affect: Mood normal.        Assessment & Plan:  [name] Benitez is a 67 y.o. male . Angioedema, subsequent encounter  Essential hypertension Hypertension controlled on current regimen.  Tolerating losartan.  On further discussion it appears he has restarted benazepril, recent temporary refill sent in  elsewhere.  Advised him to stop this medication and avoid ACE inhibitors given suspected angioedema from ACE inhibitor previously.  He also reports stopping another medication, not sure if this was losartan or Toprol, he will send a picture or call tomorrow to advise [place] further and can make other recommendations at that time.  64-month follow-up for recheck labs.  ER precautions if any signs or symptoms of recurrence of angioedema.  No orders of the defined types were placed in this encounter.  Patient Instructions  Stop benazepril. This is the medicine that is thought to have caused an allergic reaction. You should be on losartan and metoprolol, NOT benazepril. You should avoid ANY ACE-inhibitor blood pressure med at this point.   Send me a picture of the med you stopped at home, or call us with that info   No other changes today.     Signed,   Meredith Staggers, MD [facility], [facility] 05/01/22 9:26 PM

## 2022-05-01 NOTE — Patient Instructions (Addendum)
Stop benazepril. This is the medicine that is thought to have caused an allergic reaction. You should be on losartan and metoprolol, NOT benazepril. You should avoid ANY ACE-inhibitor blood pressure med at this point.   Send me a picture of the med you stopped at home, or call us with that info   No other changes today.

## 2022-05-02 ENCOUNTER — Telehealth: Payer: Self-pay | Admitting: Family Medicine

## 2022-05-02 NOTE — Telephone Encounter (Addendum)
-----   Message from Shade Flood, MD sent at 05/01/2022  9:28 PM EDT ----- 2 month recheck - meds, labs. Thanks.    I called patient this morning to schedule his two month follow up with Dr.Greene. Patient got really upset with me and refused to schedule his two month follow up. I told to patient I would inform Dr.Greene on his decision, that he refuse to schedule his follow. Patient states that he will bring his medications up here so Dr.Greene can look at them. Patient is aware that Dr.Greene is out of office today and that he would have to schedule a OV with Dr.Greene, if he want Dr.Greene to look at his medications.

## 2022-05-02 NOTE — Telephone Encounter (Signed)
Noted.  Did not get the impression there was an issue with 46-month follow-up when discussed yesterday, but certainly can try to work with him on that follow-up.  Since we just did a physical in March and recent labs I think 21-month follow-up would be reasonable as long as his blood pressures are staying stable at home on the new medicine.    Additionally his PSA had increased although still in normal range, and was having some urinary symptoms when discussed at recent visit.  I would recommend repeating a prostate test, or can refer him to urology for repeating that test as well as discussing urinary symptoms including difficulty with starting flow if those symptoms are continuing.  I can certainly place that referral without an office visit.  Let me know.

## 2022-05-02 NOTE — Telephone Encounter (Signed)
Patient declined follow up on PSA testing as well as referral, notes he wants to return in 6 months will call back to schedule a follow up for October some time

## 2022-05-02 NOTE — Telephone Encounter (Signed)
Is there some rational I can give this patient you feel may temper his feelings about scheduling or should we push it out a little further to help appease him?  Please advise

## 2022-05-11 ENCOUNTER — Encounter: Payer: Self-pay | Admitting: Pharmacist

## 2022-05-11 DIAGNOSIS — I10 Essential (primary) hypertension: Secondary | ICD-10-CM

## 2022-05-11 NOTE — Telephone Encounter (Signed)
This encounter was created in error - please disregard.

## 2022-06-01 ENCOUNTER — Ambulatory Visit (INDEPENDENT_AMBULATORY_CARE_PROVIDER_SITE_OTHER): Payer: Medicare HMO | Admitting: *Deleted

## 2022-06-01 DIAGNOSIS — Z0000 Encounter for general adult medical examination without abnormal findings: Secondary | ICD-10-CM | POA: Diagnosis not present

## 2022-06-01 DIAGNOSIS — Z1211 Encounter for screening for malignant neoplasm of colon: Secondary | ICD-10-CM

## 2022-06-01 NOTE — Progress Notes (Signed)
Subjective:   [name] Benitez is a 67 y.o. male who presents for Medicare Annual/Subsequent preventive examination.  I connected with  Alric Seton on 06/01/22 by a telephone enabled telemedicine application and verified that I am speaking with the correct person using two identifiers.   I discussed the limitations of evaluation and management by telemedicine. The patient expressed understanding and agreed to proceed.  Patient location: home  Provider location: telephone/home  Review of Systems     Cardiac Risk Factors include: male gender;advanced age (>16men, >26 women);hypertension     Objective:    Today's Vitals   There is no height or weight on file to calculate BMI.     06/01/2022    8:34 AM 05/26/2021    8:30 AM 12/08/2015   11:52 AM  Advanced Directives  Does Patient Have a Medical Advance Directive? Yes No No  Type of Electronics engineer of Healthcare Power of Attorney in Chart? No - copy requested    Would patient like information on creating a medical advance directive?  No - Patient declined     Current Medications (verified) Outpatient Encounter Medications as of 06/01/2022  Medication Sig   acetaminophen (TYLENOL) 500 MG tablet Take 500 mg by mouth 2 (two) times daily.   aspirin EC 81 MG tablet Take 81 mg by mouth daily.   Difluprednate 0.05 % EMUL Apply to eye.   Doxylamine Succinate, Sleep, (SLEEP AID PO) Take by mouth daily. 2 tablets at bedtime.   famotidine (PEPCID) 20 MG tablet Take 1 tablet (20 mg total) by mouth 2 (two) times daily for 7 days.   fish oil-omega-3 fatty acids 1000 MG capsule Take 1 g by mouth 2 (two) times daily.    ketorolac (ACULAR) 0.5 % ophthalmic solution    losartan (COZAAR) 25 MG tablet Take 1 tablet (25 mg total) by mouth daily.   metoprolol succinate (TOPROL-XL) 50 MG 24 hr tablet TAKE 1 TABLET BY MOUTH EVERY DAY WITH OR IMMEDIATELY FOLLOWING A MEAL   moxifloxacin (VIGAMOX) 0.5 %  ophthalmic solution Apply to eye.   RABEprazole (ACIPHEX) 20 MG tablet Take 1 tablet (20 mg total) by mouth daily.   sildenafil (VIAGRA) 100 MG tablet Take 0.5-1 tablets (50-100 mg total) by mouth daily as needed for erectile dysfunction.   simvastatin (ZOCOR) 40 MG tablet TAKE 1 TABLET BY MOUTH EVERY DAY   valACYclovir (VALTREX) 500 MG tablet Take 1 tablet (500 mg total) by mouth daily. Increase to BID dosing for 3 days if symptoms of flare.   No facility-administered encounter medications on file as of 06/01/2022.    Allergies (verified) Ace inhibitors   History: Past Medical History:  Diagnosis Date   CAD in native artery    Essentially LAD occlusion with in-stent restenosis/thrombosis.  50% PDA--referred for CABG x2.   Coronary stent restenosis due to progression of disease 04/2009   Subtotal occlusion of the LAD with ISR, unable to cross with wire; aneurysmal dilation of the LAD prior to stent   Current occasional smoker    He initially quit around the time of his CABG, but he has gotten back to smoking maybe 1 or 2 a day as a stress relief.  Does not smoke every day.   Dyslipidemia, goal LDL below 70    Erectile dysfunction    History of GI bleed    No recurrence   History of vertigo    Hypertension  S/P CABG x 03 April 2009   LIMA-LAD, SVG-RPDA (to bypass a roughly 50% lesion); post CABG stress normal with no ischemia   ST elevation (STEMI) myocardial infarction involving left anterior descending coronary artery [facility]) October 2005   LAD PCI with 2 overlapping Cypher DES 3.0 mm 23 mm   Past Surgical History:  Procedure Laterality Date   CARDIAC CATHETERIZATION  04/28/2009   Subtotal occlusion of mid LAD with ISR/thrombosis, unable to pass --> urgent CABG (Dr. Mariel Sleet)   cataract surgery  02/2022   COLONOSCOPY  2005   CORONARY ANGIOPLASTY WITH STENT PLACEMENT  10/26/2003   anterior ischemia with significant reversibility (mid anterior and anteroseptal, apical anterior  and apical anteroseptal)  - Cypher 3x83mm stent to mid LAD and Cypher 3x33mm Cypher DES to prox LAD(Dr. Laurell Josephs)   CORONARY ARTERY BYPASS GRAFT  04/2009   LIMA-LAD, reverse SVG-distal RCA (Dr. Kathie Rhodes. Hendrickson)   DOPPLER ECHOCARDIOGRAPHY  04/2009   EF 50-50%, borderline septal hypokinesis   Exercise Tolerance Test  06/21/2013   Exercise 8:05 Min, 10.1 METs, reached 99% max. Peak HR -- 160 bpm; NEGATIVE Bruce TM GXT = no EKG evidence of ischemia. Prolonged heart rate and blood pressure recovery and PVCs noted that resolve with Exerction.   NM MYOVIEW LTD  05/2018   EF 45%.  Low risk.  No ischemia or infarction.   Family History  Problem Relation Age of Onset   Hyperlipidemia Father    Cancer Maternal Grandmother    Cancer Paternal Grandfather    Colon cancer Neg Hx    Colon polyps Neg Hx    Esophageal cancer Neg Hx    Rectal cancer Neg Hx    Stomach cancer Neg Hx    Social History   Socioeconomic History   Marital status: Divorced    Spouse name: Not on file   Number of children: 1   Years of education: 11   Highest education level: Not on file  Occupational History    Employer: [employer]  Tobacco Use   Smoking status: Every Day    Packs/day: 0.50    Years: 50.00    Additional pack years: 0.00    Total pack years: 25.00    Types: Cigarettes   Smokeless tobacco: Never   Tobacco comments:    12 Cigarettes daily; last attempt at quitting 2005  Vaping Use   Vaping Use: Never used  Substance and Sexual Activity   Alcohol use: Yes    Comment: occas   Drug use: No   Sexual activity: Yes    Birth control/protection: Condom  Other Topics Concern   Not on file  Social History Narrative   Divorced father of one, grandfather 2. No longer smokes -- quit in 2005. Works for [employer] in [place]. Does not get routine activity/exercise -- Basically, he is not motivated to do any exercise. He, by himself, is admittedly lazy. Does not drink alcohol.    Social Determinants of Health   Financial Resource Strain: Low Risk  (06/01/2022)   Overall Financial Resource Strain (CARDIA)    Difficulty of Paying Living Expenses: Not very hard  Food Insecurity: No Food Insecurity (06/01/2022)   Hunger Vital Sign    Worried About Running Out of Food in the Last Year: Never true    Ran Out of Food in the Last Year: Never true  Transportation Needs: No Transportation Needs (06/01/2022)   PRAPARE - Administrator, Civil Service (Medical):  No    Lack of Transportation (Non-Medical): No  Physical Activity: Inactive (06/01/2022)   Exercise Vital Sign    Days of Exercise per Week: 0 days    Minutes of Exercise per Session: 0 min  Stress: No Stress Concern Present (06/01/2022)   [employer] of Occupational Health - Occupational Stress Questionnaire    Feeling of Stress : Not at all  Social Connections: Moderately Isolated (06/01/2022)   Social Connection and Isolation Panel [NHANES]    Frequency of Communication with Friends and Family: Twice a week    Frequency of Social Gatherings with Friends and Family: Twice a week    Attends Religious Services: Never    Database administrator or Organizations: Yes    Attends Engineer, structural: 1 to 4 times per year    Marital Status: Divorced    Tobacco Counseling Ready to quit: Not Answered Counseling given: Not Answered Tobacco comments: 12 Cigarettes daily; last attempt at quitting 2005   Clinical Intake:  Pre-visit preparation completed: Yes  Pain : No/denies pain     Nutritional Risks: None Diabetes: No  How often do you need to have someone help you when you read instructions, pamphlets, or other written materials from your doctor or pharmacy?: 1 - Never  Diabetic?  no  Interpreter Needed?: No  Information entered by :: Remi Haggard LPN   Activities of Daily Living    06/01/2022    8:36 AM  In your present state of health, do you have any difficulty  performing the following activities:  Hearing? 0  Vision? 0  Difficulty concentrating or making decisions? 0  Walking or climbing stairs? 0  Dressing or bathing? 0  Doing errands, shopping? 0  Preparing Food and eating ? N  Using the Toilet? N  In the past six months, have you accidently leaked urine? N  Do you have problems with loss of bowel control? N  Managing your Medications? N  Managing your Finances? N    Patient Care Team: Shade Flood, MD as PCP - General (Family Medicine) Marykay Lex, MD as PCP - Cardiology (Cardiology) Marykay Lex, MD as Consulting Physician (Cardiology) Dahlia Byes, [facility] (Inactive) as Pharmacist (Pharmacist)  Indicate any recent Medical Services you may have received from other than [facility] providers in the past year (date may be approximate).     Assessment:   This is a routine wellness examination for [facility].  Hearing/Vision screen Hearing Screening - Comments:: No trouble hearing Vision Screening - Comments:: Cataract surgery right  -06-09-2021 Not up to date Unsure of name  Dietary issues and exercise activities discussed: Current Exercise Habits: The patient does not participate in regular exercise at present   Goals Addressed             This Visit's Progress    Patient Stated   On track    Stay healthy     Patient Stated       Stay healthy       Depression Screen    06/01/2022    8:54 AM 03/29/2022    3:47 PM 01/18/2022    2:34 PM 09/09/2021    9:01 AM 05/26/2021    8:27 AM 02/09/2021    3:11 PM 11/17/2019    8:23 AM  PHQ 2/9 Scores  PHQ - 2 Score 0 0 0 0 0 0 1  PHQ- 9 Score 0 0 0 0  2 1    Fall Risk  06/01/2022    8:34 AM 03/29/2022    3:47 PM 01/18/2022    2:34 PM 09/09/2021    9:01 AM 05/26/2021    8:22 AM  Fall Risk   Falls in the past year? 0 0 0 0 0  Number falls in past yr: 0 0 0 0 0  Injury with Fall? 0 0 0 0 0  Risk for fall due to :  No Fall Risks No Fall Risks No Fall Risks No Fall Risks   Follow up Falls evaluation completed;Education provided;Falls prevention discussed  Falls evaluation completed Falls evaluation completed Falls prevention discussed    FALL RISK PREVENTION PERTAINING TO THE HOME:  Any stairs in or around the home? Yes  If so, are there any without handrails? No  Home free of loose throw rugs in walkways, pet beds, electrical cords, etc? No  Adequate lighting in your home to reduce risk of falls? No   ASSISTIVE DEVICES UTILIZED TO PREVENT FALLS:  Life alert? No  Use of a cane, walker or w/c? No  Grab bars in the bathroom? Yes  Shower chair or bench in shower? No  Elevated toilet seat or a handicapped toilet? No   TIMED UP AND GO:  Was the test performed? No .    Cognitive Function:        06/01/2022    8:38 AM 05/26/2021    8:34 AM  6CIT Screen  What Year? 0 points 0 points  What month? 0 points 0 points  What time? 0 points 0 points  Count back from 20 0 points 0 points  Months in reverse 0 points 0 points  Repeat phrase  0 points  Total Score  0 points    Immunizations Immunization History  Administered Date(s) Administered   Fluad Quad(high Dose 65+) 09/09/2021   Influenza Inj Mdck Quad Pf 10/03/2017   Influenza,inj,Quad PF,6+ Mos 09/21/2018, 10/03/2019   Influenza-Unspecified 12/01/2020   Moderna Sars-Covid-2 Vaccination 03/29/2019, 04/26/2019   PFIZER(Purple Top)SARS-COV-2 Vaccination 11/07/2019   PNEUMOCOCCAL CONJUGATE-20 09/09/2021   Pfizer Covid-19 Vaccine Bivalent Booster 50yrs & up 02/25/2021   Tdap 11/17/2019    TDAP status: Up to date  Flu Vaccine status: Up to date  Pneumococcal vaccine status: Up to date  Covid-19 vaccine status: Information provided on how to obtain vaccines.   Qualifies for Shingles Vaccine? Yes   Zostavax completed No   Shingrix Completed?: No.    Education has been provided regarding the importance of this vaccine. Patient has been advised to call insurance company to determine out of  pocket expense if they have not yet received this vaccine. Advised may also receive vaccine at local pharmacy or Health Dept. Verbalized acceptance and understanding.  Screening Tests Health Maintenance  Topic Date Due   Lung Cancer Screening  Never done   Zoster Vaccines- Shingrix (1 of 2) Never done   COVID-19 Vaccine (5 - 2023-24 season) 06/17/2022 (Originally 09/02/2021)   Colonoscopy  09/10/2022 (Originally 12/25/2019)   INFLUENZA VACCINE  08/03/2022   Medicare Annual Wellness (AWV)  06/01/2023   DTaP/Tdap/Td (2 - Td or Tdap) 11/16/2029   Pneumonia Vaccine 55+ Years old  Completed   Hepatitis C Screening  Completed   HPV VACCINES  Aged Out    Health Maintenance  Health Maintenance Due  Topic Date Due   Lung Cancer Screening  Never done   Zoster Vaccines- Shingrix (1 of 2) Never done    Colorectal cancer screening: Type of screening: Colonoscopy. Completed 2020. Repeat  every 1 years  Lung Cancer Screening: (Low Dose CT Chest recommended if Age 49-80 years, 30 pack-year currently smoking OR have quit w/in 15years.) does qualify.   ordered  Lung Cancer Screening Referral: ordered  Additional Screening:  Hepatitis C Screening: does not qualify; Completed 2016  Vision Screening: Recommended annual ophthalmology exams for early detection of glaucoma and other disorders of the eye. Is the patient up to date with their annual eye exam?  No  Who is the provider or what is the name of the office in which the patient attends annual eye exams? Unsure of name had cataract surgery If pt is not established with a provider, would they like to be referred to a provider to establish care? No .   Dental Screening: Recommended annual dental exams for proper oral hygiene  Community Resource Referral / Chronic Care Management: CRR required this visit?  No   CCM required this visit?  No      Plan:     I have personally reviewed and noted the following in the patient's chart:    Medical and social history Use of alcohol, tobacco or illicit drugs  Current medications and supplements including opioid prescriptions. Patient is not currently taking opioid prescriptions. Functional ability and status Nutritional status Physical activity Advanced directives List of other physicians Hospitalizations, surgeries, and ER visits in previous 12 months Vitals Screenings to include cognitive, depression, and falls Referrals and appointments  In addition, I have reviewed and discussed with patient certain preventive protocols, quality metrics, and best practice recommendations. A written personalized care plan for preventive services as well as general preventive health recommendations were provided to patient.     Remi Haggard, LPN   1/61/0960   Nurse Notes:

## 2022-06-01 NOTE — Patient Instructions (Signed)
[name] Benitez , Thank you for taking time to come for your Medicare Wellness Visit. I appreciate your ongoing commitment to your health goals. Please review the following plan we discussed and let me know if I can assist you in the future.   Screening recommendations/referrals: Colonoscopy: Education provided Recommended yearly ophthalmology/optometry visit for glaucoma screening and checkup Recommended yearly dental visit for hygiene and checkup  Vaccinations: Influenza vaccine: up to date Pneumococcal vaccine: up to date Tdap vaccine: up to date Shingles vaccine: Education provided    Advanced directives: not on file    Preventive Care 65 Years and Older, Male Preventive care refers to lifestyle choices and visits with your health care provider that can promote health and wellness. What does preventive care include? A yearly physical exam. This is also called an annual well check. Dental exams once or twice a year. Routine eye exams. Ask your health care provider how often you should have your eyes checked. Personal lifestyle choices, including: Daily care of your teeth and gums. Regular physical activity. Eating a healthy diet. Avoiding tobacco and drug use. Limiting alcohol use. Practicing safe sex. Taking low doses of aspirin every day. Taking vitamin and mineral supplements as recommended by your health care provider. What happens during an annual well check? The services and screenings done by your health care provider during your annual well check will depend on your age, overall health, lifestyle risk factors, and family history of disease. Counseling  Your health care provider may ask you questions about your: Alcohol use. Tobacco use. Drug use. Emotional well-being. Home and relationship well-being. Sexual activity. Eating habits. History of falls. Memory and ability to understand (cognition). Work and work Astronomer. Screening  You may have the following  tests or measurements: Height, weight, and BMI. Blood pressure. Lipid and cholesterol levels. These may be checked every 5 years, or more frequently if you are over 62 years old. Skin check. Lung cancer screening. You may have this screening every year starting at age 68 if you have a 30-pack-year history of smoking and currently smoke or have quit within the past 15 years. Fecal occult blood test (FOBT) of the stool. You may have this test every year starting at age 80. Flexible sigmoidoscopy or colonoscopy. You may have a sigmoidoscopy every 5 years or a colonoscopy every 10 years starting at age 3. Prostate cancer screening. Recommendations will vary depending on your family history and other risks. Hepatitis C blood test. Hepatitis B blood test. Sexually transmitted disease (STD) testing. Diabetes screening. This is done by checking your blood sugar (glucose) after you have not eaten for a while (fasting). You may have this done every 1-3 years. Abdominal aortic aneurysm (AAA) screening. You may need this if you are a current or former smoker. Osteoporosis. You may be screened starting at age 38 if you are at high risk. Talk with your health care provider about your test results, treatment options, and if necessary, the need for more tests. Vaccines  Your health care provider may recommend certain vaccines, such as: Influenza vaccine. This is recommended every year. Tetanus, diphtheria, and acellular pertussis (Tdap, Td) vaccine. You may need a Td booster every 10 years. Zoster vaccine. You may need this after age 68. Pneumococcal 13-valent conjugate (PCV13) vaccine. One dose is recommended after age 7. Pneumococcal polysaccharide (PPSV23) vaccine. One dose is recommended after age 75. Talk to your health care provider about which screenings and vaccines you need and how often you need them. This  information is not intended to replace advice given to you by your health care provider.  Make sure you discuss any questions you have with your health care provider. Document Released: 01/15/2015 Document Revised: 09/08/2015 Document Reviewed: 10/20/2014 Elsevier Interactive Patient Education  2017 [employer].  Fall Prevention in the Home Falls can cause injuries. They can happen to people of all ages. There are many things you can do to make your home safe and to help prevent falls. What can I do on the outside of my home? Regularly fix the edges of walkways and driveways and fix any cracks. Remove anything that might make you trip as you walk through a door, such as a raised step or threshold. Trim any bushes or trees on the path to your home. Use bright outdoor lighting. Clear any walking paths of anything that might make someone trip, such as rocks or tools. Regularly check to see if handrails are loose or broken. Make sure that both sides of any steps have handrails. Any raised decks and porches should have guardrails on the edges. Have any leaves, snow, or ice cleared regularly. Use sand or salt on walking paths during winter. Clean up any spills in your garage right away. This includes oil or grease spills. What can I do in the bathroom? Use night lights. Install grab bars by the toilet and in the tub and shower. Do not use towel bars as grab bars. Use non-skid mats or decals in the tub or shower. If you need to sit down in the shower, use a plastic, non-slip stool. Keep the floor dry. Clean up any water that spills on the floor as soon as it happens. Remove soap buildup in the tub or shower regularly. Attach bath mats securely with double-sided non-slip rug tape. Do not have throw rugs and other things on the floor that can make you trip. What can I do in the bedroom? Use night lights. Make sure that you have a light by your bed that is easy to reach. Do not use any sheets or blankets that are too big for your bed. They should not hang down onto the floor. Have a  firm chair that has side arms. You can use this for support while you get dressed. Do not have throw rugs and other things on the floor that can make you trip. What can I do in the kitchen? Clean up any spills right away. Avoid walking on wet floors. Keep items that you use a lot in easy-to-reach places. If you need to reach something above you, use a strong step stool that has a grab bar. Keep electrical cords out of the way. Do not use floor polish or wax that makes floors slippery. If you must use wax, use non-skid floor wax. Do not have throw rugs and other things on the floor that can make you trip. What can I do with my stairs? Do not leave any items on the stairs. Make sure that there are handrails on both sides of the stairs and use them. Fix handrails that are broken or loose. Make sure that handrails are as long as the stairways. Check any carpeting to make sure that it is firmly attached to the stairs. Fix any carpet that is loose or worn. Avoid having throw rugs at the top or bottom of the stairs. If you do have throw rugs, attach them to the floor with carpet tape. Make sure that you have a light switch at the top  of the stairs and the bottom of the stairs. If you do not have them, ask someone to add them for you. What else can I do to help prevent falls? Wear shoes that: Do not have high heels. Have rubber bottoms. Are comfortable and fit you well. Are closed at the toe. Do not wear sandals. If you use a stepladder: Make sure that it is fully opened. Do not climb a closed stepladder. Make sure that both sides of the stepladder are locked into place. Ask someone to hold it for you, if possible. Clearly mark and make sure that you can see: Any grab bars or handrails. First and last steps. Where the edge of each step is. Use tools that help you move around (mobility aids) if they are needed. These include: Canes. Walkers. Scooters. Crutches. Turn on the lights when you  go into a dark area. Replace any light bulbs as soon as they burn out. Set up your furniture so you have a clear path. Avoid moving your furniture around. If any of your floors are uneven, fix them. If there are any pets around you, be aware of where they are. Review your medicines with your doctor. Some medicines can make you feel dizzy. This can increase your chance of falling. Ask your doctor what other things that you can do to help prevent falls. This information is not intended to replace advice given to you by your health care provider. Make sure you discuss any questions you have with your health care provider. Document Released: 10/15/2008 Document Revised: 05/27/2015 Document Reviewed: 01/23/2014 Elsevier Interactive Patient Education  2017 [employer].

## 2022-06-05 ENCOUNTER — Other Ambulatory Visit: Payer: Self-pay | Admitting: Family Medicine

## 2022-06-05 DIAGNOSIS — I10 Essential (primary) hypertension: Secondary | ICD-10-CM

## 2022-06-05 DIAGNOSIS — Z951 Presence of aortocoronary bypass graft: Secondary | ICD-10-CM

## 2022-06-13 DIAGNOSIS — S93602A Unspecified sprain of left foot, initial encounter: Secondary | ICD-10-CM | POA: Diagnosis not present

## 2022-06-27 ENCOUNTER — Ambulatory Visit: Payer: Medicare HMO | Attending: Cardiology | Admitting: Cardiology

## 2022-06-27 ENCOUNTER — Encounter: Payer: Self-pay | Admitting: Cardiology

## 2022-06-27 VITALS — BP 110/78 | HR 74 | Ht 67.0 in | Wt 169.0 lb

## 2022-06-27 DIAGNOSIS — E785 Hyperlipidemia, unspecified: Secondary | ICD-10-CM

## 2022-06-27 DIAGNOSIS — I10 Essential (primary) hypertension: Secondary | ICD-10-CM | POA: Diagnosis not present

## 2022-06-27 DIAGNOSIS — N5201 Erectile dysfunction due to arterial insufficiency: Secondary | ICD-10-CM

## 2022-06-27 DIAGNOSIS — Z024 Encounter for examination for driving license: Secondary | ICD-10-CM | POA: Diagnosis not present

## 2022-06-27 DIAGNOSIS — I2510 Atherosclerotic heart disease of native coronary artery without angina pectoris: Secondary | ICD-10-CM

## 2022-06-27 DIAGNOSIS — Z951 Presence of aortocoronary bypass graft: Secondary | ICD-10-CM | POA: Diagnosis not present

## 2022-06-27 NOTE — Patient Instructions (Signed)
Medication Instructions:  No changes  *If you need a refill on your cardiac medications before your next appointment, please call your pharmacy*   Lab Work: Not needed   Testing/Procedures: Will be schedule at [facility] [address]- April 2025   Your doctor has scheduled you for a Myocardial Perfusion scan to obtain information about the blood flow to your heart. The test consists of taking pictures of your heart in two phases: while resting and after a stress test.  The stress test may involve walking on a treadmill, or if you are unable to exercise adequately, you will be given a drug intended to have a similar effect on the heart to that of exercise.  The test will take approximately 3 to 4  hours to complete. How to prepare for your test: Do not eat or drink 2 hours prior to your test Do not consume products containing caffeine 12 hours prior to your test (examples: coffee (regular OR decaf), chocolate, sodas, tea) Your doctor may need you to hold certain medications prior to the test.  If so, these are listed below and should not be taken for 24 hours prior to the test.  If not listed below, you may take your medications as normal.  You may resume taking held medications on your normal schedule once the test is complete.   Meds to hold: none Do bring a list of your current medications with you.  If you have held any meds in preparation for the test, please bring them, as you may be required to take them once the test is completed. Do wear comfortable clothes and walking shoes.  Do not wear dresses or overalls. Do NOT wear cologne, perfume, aftershave, or fragranced lotions the day of your test (deodorants okay). If these instructions are not followed your test will have to be rescheduled.   A nuclear cardiologist will review your test, prepare a report and send it to your physician.   If you have questions or concerns about your appointment, you can call the Nuclear  Cardiology department at [phone]. If you cannot keep your appointment, please provide 48 hours notification to avoid a possible $50.00 charge to your account.   Please arrive 15 minutes prior to your appointment time for registration and insurance purposes      Follow-Up: At [facility], you and your health needs are our priority.  As part of our continuing mission to provide you with exceptional heart care, we have created designated Provider Care Teams.  These Care Teams include your primary Cardiologist (physician) and Advanced Practice Providers (APPs -  Physician Assistants and Nurse Practitioners) who all work together to provide you with the care you need, when you need it.     Your next appointment:   10 to 11 month(s)  The format for your next appointment:   In Person  Provider:   Bryan Lemma, MD    Other Instructions
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
## 2022-06-29 ENCOUNTER — Ambulatory Visit (AMBULATORY_SURGERY_CENTER): Payer: Medicare HMO

## 2022-06-29 VITALS — Ht 66.0 in | Wt 167.0 lb

## 2022-06-29 DIAGNOSIS — Z86010 Personal history of colonic polyps: Secondary | ICD-10-CM

## 2022-06-29 MED ORDER — NA SULFATE-K SULFATE-MG SULF 17.5-3.13-1.6 GM/177ML PO SOLN
1.0000 | Freq: Once | ORAL | 0 refills | Status: AC
Start: 2022-06-29 — End: 2022-06-29

## 2022-06-30 ENCOUNTER — Encounter: Payer: Self-pay | Admitting: Gastroenterology

## 2022-07-01 ENCOUNTER — Encounter: Payer: Self-pay | Admitting: Cardiology

## 2022-07-01 NOTE — Assessment & Plan Note (Signed)
PRN sildenafil.

## 2022-07-01 NOTE — Assessment & Plan Note (Signed)
Now 13 years out from CABG doing well with no recurrent angina.  Most recent Myoview in 2020 showed no evidence of ischemia or infarction.  EF was slightly down which did not correlate with symptoms. 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  Plan: Continue aspirin 81 mg daily along with simvastatin 40 mg. Switch from ACE inhibitor to ARB for concerns of angioedema.  No signs of crossover angioedema issues from losartan. On moderate dose Toprol. Still takes fish oil With him doing so well and having lost weight, I think we can wait another year until spring/early summer 2025 for surveillance Myoview stress test.  Informed Consent   Shared Decision Making/Informed Consent The risks [chest pain, shortness of breath, cardiac arrhythmias, dizziness, blood pressure fluctuations, myocardial infarction, stroke/transient ischemic attack, nausea, vomiting, allergic reaction, radiation exposure, metallic taste sensation and life-threatening complications (estimated to be 1 in 10,000)], benefits (risk stratification, diagnosing coronary artery disease, treatment guidance) and alternatives of a nuclear stress test were discussed in detail with [name] Benitez and he agrees to proceed.

## 2022-07-01 NOTE — Assessment & Plan Note (Signed)
Blood pressure looks great on losartan and Toprol.

## 2022-07-01 NOTE — Assessment & Plan Note (Signed)
Labs recently checked showed LDL stable at 54 on current dose simvastatin.  I think his weight loss is made a difference since his LDL dropped from 60-54. Marland Kitchen Continue to encourage dietary modification and increased exercise.  Labs are usually followed by PCP.

## 2022-07-01 NOTE — Assessment & Plan Note (Signed)
Began 12 years out from two-vessel CABG LIMA to LAD and SVG to RCA. Will be due for follow-up surveillance Myoview next spring-had thought about 2024 versus 2025, but with no active symptoms, we can wait till 2025.

## 2022-07-20 ENCOUNTER — Ambulatory Visit (AMBULATORY_SURGERY_CENTER): Payer: Medicare HMO | Admitting: Gastroenterology

## 2022-07-20 ENCOUNTER — Encounter: Payer: Self-pay | Admitting: Gastroenterology

## 2022-07-20 VITALS — BP 87/66 | HR 77 | Temp 97.3°F | Resp 17 | Ht 66.0 in | Wt 167.0 lb

## 2022-07-20 DIAGNOSIS — Z86010 Personal history of colonic polyps: Secondary | ICD-10-CM | POA: Diagnosis not present

## 2022-07-20 DIAGNOSIS — Z09 Encounter for follow-up examination after completed treatment for conditions other than malignant neoplasm: Secondary | ICD-10-CM | POA: Diagnosis not present

## 2022-07-20 DIAGNOSIS — D123 Benign neoplasm of transverse colon: Secondary | ICD-10-CM

## 2022-07-20 DIAGNOSIS — I10 Essential (primary) hypertension: Secondary | ICD-10-CM | POA: Diagnosis not present

## 2022-07-20 DIAGNOSIS — K635 Polyp of colon: Secondary | ICD-10-CM | POA: Diagnosis not present

## 2022-07-20 DIAGNOSIS — I2510 Atherosclerotic heart disease of native coronary artery without angina pectoris: Secondary | ICD-10-CM | POA: Diagnosis not present

## 2022-07-20 MED ORDER — SODIUM CHLORIDE 0.9 % IV SOLN
500.0000 mL | INTRAVENOUS | Status: DC
Start: 2022-07-20 — End: 2022-07-20

## 2022-07-20 NOTE — Progress Notes (Signed)
Pt's states no medical or surgical changes since previsit or office visit. 

## 2022-07-20 NOTE — Progress Notes (Signed)
Called to room to assist during endoscopic procedure.  Patient ID and intended procedure confirmed with present staff. Received instructions for my participation in the procedure from the performing physician.  

## 2022-07-20 NOTE — Patient Instructions (Signed)
Resume all of your previous medications today as ordered.  Read all of the handouts given to you by your recovery room nurse.  YOU HAD AN ENDOSCOPIC PROCEDURE TODAY AT THE [facility]:   Refer to the procedure report that was given to you for any specific questions about what was found during the examination.  If the procedure report does not answer your questions, please call your gastroenterologist to clarify.  If you requested that your care partner not be given the details of your procedure findings, then the procedure report has been included in a sealed envelope for you to review at your convenience later.  YOU SHOULD EXPECT: Some feelings of bloating in the abdomen. Passage of more gas than usual.  Walking can help get rid of the air that was put into your GI tract during the procedure and reduce the bloating. If you had a lower endoscopy (such as a colonoscopy or flexible sigmoidoscopy) you may notice spotting of blood in your stool or on the toilet paper. If you underwent a bowel prep for your procedure, you may not have a normal bowel movement for a few days.  Please Note:  You might notice some irritation and congestion in your nose or some drainage.  This is from the oxygen used during your procedure.  There is no need for concern and it should clear up in a day or so.  SYMPTOMS TO REPORT IMMEDIATELY:  Following lower endoscopy (colonoscopy or flexible sigmoidoscopy):  Excessive amounts of blood in the stool  Significant tenderness or worsening of abdominal pains  Swelling of the abdomen that is new, acute  Fever of 100F or higher   For urgent or emergent issues, a gastroenterologist can be reached at any hour by calling [phone]. Do not use MyChart messaging for urgent concerns.    DIET:  We do recommend a small meal at first, but then you may proceed to your regular diet.  Drink plenty of fluids but you should avoid alcoholic beverages for 24 hours. Try to eat  a high fiber diet, and drink plenty of water.  ACTIVITY:  You should plan to take it easy for the rest of today and you should NOT DRIVE or use heavy machinery until tomorrow (because of the sedation medicines used during the test).    FOLLOW UP: Our staff will call the number listed on your records the next business day following your procedure.  We will call around 7:15- 8:00 am to check on you and address any questions or concerns that you may have regarding the information given to you following your procedure. If we do not reach you, we will leave a message.     If any biopsies were taken you will be contacted by phone or by letter within the next 1-3 weeks.  Please call us at [phone] if you have not heard about the biopsies in 3 weeks.    SIGNATURES/CONFIDENTIALITY: You and/or your care partner have signed paperwork which will be entered into your electronic medical record.  These signatures attest to the fact that that the information above on your After Visit Summary has been reviewed and is understood.  Full responsibility of the confidentiality of this discharge information lies with you and/or your care-partner.

## 2022-07-20 NOTE — Progress Notes (Signed)
A/O x 3, gd SR's, VSS, report to RN

## 2022-07-20 NOTE — Progress Notes (Signed)
History and Physical:  This patient presents for endoscopic testing for: Encounter Diagnosis  Name Primary?   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   Past Medical History: Past Medical History:  Diagnosis Date   CAD in native artery 10/2003   Essentially LAD occlusion with in-stent restenosis/thrombosis.  50% PDA--referred for CABG x2.   Coronary stent restenosis due to progression of disease 04/2009   Subtotal occlusion of the LAD with ISR, unable to cross with wire; aneurysmal dilation of the LAD prior to stent   Current occasional smoker    He initially quit around the time of his CABG, but he has gotten back to smoking maybe 1 or 2 a day as a stress relief.  Does not smoke every day.   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 Mariel Sleet)   cataract surgery  02/2022   COLONOSCOPY  2005   CORONARY ANGIOPLASTY WITH STENT PLACEMENT  10/26/2003   anterior ischemia with significant reversibility (mid anterior and anteroseptal, apical anterior and apical anteroseptal)  - Cypher 3x76mm stent to mid LAD and Cypher 3x78mm Cypher DES to prox LAD(Dr. Laurell Josephs)   CORONARY ARTERY  BYPASS GRAFT  04/2009   LIMA-LAD, reverse SVG-distal RCA (Dr. Kathie Rhodes. Hendrickson)   DOPPLER ECHOCARDIOGRAPHY  04/2009   EF 50-50%, borderline septal hypokinesis   Exercise Tolerance Test  06/21/2013   Exercise 8:05 Min, 10.1 METs, reached 99% max. Peak HR -- 160 bpm; NEGATIVE Bruce TM GXT = no EKG evidence of ischemia. Prolonged heart rate and blood pressure recovery and PVCs noted that resolve with Exerction.   NM MYOVIEW LTD  05/2018   EF 45%.  Low risk.  No ischemia or infarction.    Allergies: Allergies  Allergen Reactions   Ace Inhibitors Other (See Comments)    Angioedema 04/05/22.     Outpatient Meds: Current Outpatient Medications  Medication Sig Dispense Refill   acetaminophen (TYLENOL) 500 MG tablet Take 500 mg by mouth 2 (two) times daily.     aspirin EC 81 MG tablet Take 81 mg by mouth daily.     benazepril (LOTENSIN) 20 MG tablet Take 20 mg by mouth daily.     Doxylamine Succinate, Sleep, (SLEEP AID PO) Take by mouth daily. 2 tablets at bedtime.     fish oil-omega-3 fatty acids 1000 MG capsule Take 1 g by mouth 2 (two) times daily.      metoprolol succinate (TOPROL-XL) 50 MG 24 hr tablet TAKE 1 TABLET BY MOUTH EVERY DAY WITH OR IMMEDIATELY FOLLOWING A MEAL 90 tablet 2   sildenafil (VIAGRA) 100 MG tablet  Take 0.5-1 tablets (50-100 mg total) by mouth daily as needed for erectile dysfunction. 5 tablet 11   simvastatin (ZOCOR) 40 MG tablet TAKE 1 TABLET BY MOUTH EVERY DAY 90 tablet 3   losartan (COZAAR) 25 MG tablet Take 1 tablet (25 mg total) by mouth daily. (Patient taking differently: Take 25 mg by mouth daily. Not started taking yet. Pt to start after he finishes Benzapril.) 90 tablet 1   RABEprazole (ACIPHEX) 20 MG tablet Take 1 tablet (20 mg total) by mouth daily. 90 tablet 3   valACYclovir (VALTREX) 500 MG tablet Take 1 tablet (500 mg total) by mouth daily. Increase to BID dosing for 3 days if symptoms of flare. 90 tablet 3   Current Facility-Administered Medications   Medication Dose Route Frequency Provider Last Rate Last Admin   0.9 %  sodium chloride infusion  500 mL Intravenous Continuous Danis, Starr Lake III, MD          ___________________________________________________________________ Objective   Exam:  BP (!) 125/90   Pulse 79   Temp (!) 97.3 F (36.3 C)   Ht 5\' 6"  (1.676 m)   Wt 167 lb (75.8 kg)   SpO2 97%   BMI 26.95 kg/m   CV: regular , S1/S2 Resp: clear to auscultation bilaterally, normal RR and effort noted GI: soft, no tenderness, with active bowel sounds.   Assessment: Encounter Diagnosis  Name Primary?   Personal history of colonic polyps Yes     Plan: Colonoscopy  The benefits and risks of the planned procedure were described in detail with the patient or (when appropriate) their health care proxy.  Risks were outlined as including, but not limited to, bleeding, infection, perforation, adverse medication reaction leading to cardiac or pulmonary decompensation, pancreatitis (if ERCP).  The limitation of incomplete mucosal visualization was also discussed.  No guarantees or warranties were given.  The patient is appropriate for an endoscopic procedure in the ambulatory setting.   - Amada Jupiter, MD

## 2022-07-20 NOTE — Op Note (Signed)
[facility] Patient Name: [name] Benitez Procedure Date: 07/20/2022 10:35 AM MRN: [id number] Endoscopist: Sherilyn Cooter L. Myrtie Neither , MD, [id number] Age: 67 Referring MD:  Date of Birth: [birth year] Gender: Male Account #: [id number] Procedure:                Colonoscopy Indications:              Surveillance: Personal history of adenomatous                            polyps on last colonoscopy > 3 years ago                           3 TA and SSP as well as a rectal 16mm villous                            adenoma with focal HGD in December 2020                           No polyps 2005 Medicines:                Monitored Anesthesia Care Procedure:                Pre-Anesthesia Assessment:                           - Prior to the procedure, a History and Physical                            was performed, and patient medications and                            allergies were reviewed. The patient's tolerance of                            previous anesthesia was also reviewed. The risks                            and benefits of the procedure and the sedation                            options and risks were discussed with the patient.                            All questions were answered, and informed consent                            was obtained. Prior Anticoagulants: The patient has                            taken no anticoagulant or antiplatelet agents. ASA                            Grade Assessment: II - A patient with mild systemic  disease. After reviewing the risks and benefits,                            the patient was deemed in satisfactory condition to                            undergo the procedure.                           After obtaining informed consent, the colonoscope                            was passed under direct vision. Throughout the                            procedure, the patient's blood pressure, pulse, and                             oxygen saturations were monitored continuously. The                            Olympus CF-HQ190L [id number]) Colonoscope was                            introduced through the anus and advanced to the the                            cecum, identified by appendiceal orifice and                            ileocecal valve. The colonoscopy was performed                            without difficulty. The patient tolerated the                            procedure well. The quality of the bowel                            preparation was good. The ileocecal valve,                            appendiceal orifice, and rectum were photographed. Scope In: 10:56:40 AM Scope Out: 11:08:44 AM Scope Withdrawal Time: 0 hours 9 minutes 0 seconds  Total Procedure Duration: 0 hours 12 minutes 4 seconds  Findings:                 The perianal and digital rectal examinations were                            normal.                           Repeat examination of right colon under NBI  performed.                           Diverticula were found in the left colon.                           A diminutive polyp was found in the transverse                            colon. The polyp was semi-sessile. The polyp was                            removed with a cold snare. Resection and retrieval                            were complete.                           Internal hemorrhoids were found.                           Two tattoos were seen in the rectum. A                            post-polypectomy scar was found at the tattoo site.                            There was no evidence of residual polyp tissue                            (examined under WL and NBI).                           The exam was otherwise without abnormality on                            direct and retroflexion views. Complications:            No immediate complications. Estimated Blood Loss:     Estimated blood loss was  minimal. Impression:               - Diverticulosis in the left colon.                           - One diminutive polyp in the transverse colon,                            removed with a cold snare. Resected and retrieved.                           - Internal hemorrhoids.                           - Tattoos were seen in the rectum. A  post-polypectomy scar was found at the tattoo site.                            There was no evidence of residual polyp tissue.                           - The examination was otherwise normal on direct                            and retroflexion views. Recommendation:           - Patient has a contact number available for                            emergencies. The signs and symptoms of potential                            delayed complications were discussed with the                            patient. Return to normal activities tomorrow.                            Written discharge instructions were provided to the                            patient.                           - Resume previous diet.                           - Continue present medications.                           - Await pathology results.                           - Repeat colonoscopy in 5 years for surveillance                            (regardless of polyp pathology). [name] Benitez L. Myrtie Neither, MD 07/20/2022 11:13:39 AM This report has been signed electronically.

## 2022-07-21 ENCOUNTER — Telehealth: Payer: Self-pay

## 2022-07-21 NOTE — Telephone Encounter (Signed)
  Follow up Call-     07/20/2022   10:09 AM  Call back number  Post procedure Call Back phone  # [phone]  Permission to leave phone message Yes    Post op call attempted, no answer, left WM.

## 2022-07-26 ENCOUNTER — Encounter: Payer: Self-pay | Admitting: Gastroenterology

## 2022-08-08 ENCOUNTER — Encounter: Payer: Self-pay | Admitting: Emergency Medicine

## 2022-09-27 ENCOUNTER — Other Ambulatory Visit: Payer: Self-pay | Admitting: Family Medicine

## 2022-09-27 DIAGNOSIS — I10 Essential (primary) hypertension: Secondary | ICD-10-CM

## 2022-11-15 ENCOUNTER — Other Ambulatory Visit: Payer: Self-pay | Admitting: Family Medicine

## 2022-11-15 DIAGNOSIS — N5201 Erectile dysfunction due to arterial insufficiency: Secondary | ICD-10-CM

## 2022-11-15 DIAGNOSIS — R12 Heartburn: Secondary | ICD-10-CM

## 2022-11-20 ENCOUNTER — Other Ambulatory Visit: Payer: Self-pay | Admitting: Family Medicine

## 2022-11-20 DIAGNOSIS — N5201 Erectile dysfunction due to arterial insufficiency: Secondary | ICD-10-CM

## 2022-11-21 ENCOUNTER — Telehealth: Payer: Self-pay | Admitting: Cardiology

## 2022-11-21 NOTE — Telephone Encounter (Signed)
*  STAT* If patient is at the pharmacy, call can be transferred to refill team.   1. Which medications need to be refilled? (please list name of each medication and dose if known)   sildenafil (VIAGRA) 100 MG tablet    2. Which pharmacy/location (including street and city if local pharmacy) is medication to be sent to? [id number] - [place], [place] - [address]    3. Do they need a 30 day or 90 day supply? 30 day

## 2022-11-22 ENCOUNTER — Other Ambulatory Visit: Payer: Self-pay | Admitting: Family Medicine

## 2022-11-22 ENCOUNTER — Telehealth: Payer: Self-pay | Admitting: Family Medicine

## 2022-11-22 DIAGNOSIS — I10 Essential (primary) hypertension: Secondary | ICD-10-CM

## 2022-11-22 MED ORDER — LOSARTAN POTASSIUM 25 MG PO TABS
25.0000 mg | ORAL_TABLET | Freq: Every day | ORAL | 0 refills | Status: DC
Start: 2022-11-22 — End: 2023-01-19

## 2022-11-22 NOTE — Telephone Encounter (Signed)
Encourage patient to contact the pharmacy for refills or they can request refills through [facility]  WHAT PHARMACY WOULD THEY LIKE THIS SENT TO:  [id number] - [place], [place] - [address]    MEDICATION NAME & DOSE: losartan (COZAAR) 25 MG tablet  sildenafil (VIAGRA) 100 MG tablet (failed to transmit to pharmacy)  NOTES/COMMENTS FROM PATIENT:      Front office please notify patient: It takes 48-72 hours to process rx refill requests Ask patient to call pharmacy to ensure rx is ready before heading there.

## 2022-11-22 NOTE — Telephone Encounter (Signed)
Rx has been sent for Losartan however viagra was filled 11/16/2022 with multiple refills cannot send more at this time attempted call to patient number is not in service

## 2022-11-24 ENCOUNTER — Other Ambulatory Visit: Payer: Self-pay

## 2022-11-24 DIAGNOSIS — N5201 Erectile dysfunction due to arterial insufficiency: Secondary | ICD-10-CM

## 2022-11-24 MED ORDER — SILDENAFIL CITRATE 100 MG PO TABS
ORAL_TABLET | ORAL | 11 refills | Status: DC
Start: 2022-11-24 — End: 2023-07-16

## 2022-11-24 NOTE — Telephone Encounter (Signed)
Pt is stating that he got a Good Rx card last week to get the Viagra and (30 or 90 count) pharmacist told him to call here. He never got meds. Now he's being told he filled meds which he says he didn't. Confused -advise

## 2022-11-24 NOTE — Telephone Encounter (Signed)
Called and spoke with patient and let him know there should be refills available

## 2022-11-24 NOTE — Telephone Encounter (Signed)
Called and checked with pharmacy they do not have the Rx I have re sent this so the patient can get this filled!

## 2022-12-30 ENCOUNTER — Other Ambulatory Visit: Payer: Self-pay | Admitting: Family Medicine

## 2022-12-30 DIAGNOSIS — I10 Essential (primary) hypertension: Secondary | ICD-10-CM

## 2022-12-30 DIAGNOSIS — Z951 Presence of aortocoronary bypass graft: Secondary | ICD-10-CM

## 2023-01-04 ENCOUNTER — Other Ambulatory Visit: Payer: Self-pay | Admitting: Family Medicine

## 2023-01-04 DIAGNOSIS — A6000 Herpesviral infection of urogenital system, unspecified: Secondary | ICD-10-CM

## 2023-01-19 ENCOUNTER — Other Ambulatory Visit: Payer: Self-pay | Admitting: Family Medicine

## 2023-01-19 DIAGNOSIS — I10 Essential (primary) hypertension: Secondary | ICD-10-CM

## 2023-02-23 ENCOUNTER — Other Ambulatory Visit: Payer: Self-pay | Admitting: Family Medicine

## 2023-03-16 ENCOUNTER — Other Ambulatory Visit: Payer: Self-pay | Admitting: Family Medicine

## 2023-03-16 DIAGNOSIS — I10 Essential (primary) hypertension: Secondary | ICD-10-CM

## 2023-05-23 ENCOUNTER — Encounter: Payer: Self-pay | Admitting: Cardiology

## 2023-06-18 ENCOUNTER — Encounter (HOSPITAL_COMMUNITY): Payer: Self-pay | Admitting: *Deleted

## 2023-06-18 ENCOUNTER — Telehealth (HOSPITAL_COMMUNITY): Payer: Self-pay | Admitting: *Deleted

## 2023-06-18 NOTE — Telephone Encounter (Signed)
 Instructions sent for upcoming stress test via [employer].  Argentina Bees, RN

## 2023-06-19 ENCOUNTER — Ambulatory Visit (INDEPENDENT_AMBULATORY_CARE_PROVIDER_SITE_OTHER): Admitting: *Deleted

## 2023-06-19 DIAGNOSIS — Z0000 Encounter for general adult medical examination without abnormal findings: Secondary | ICD-10-CM

## 2023-06-19 NOTE — Patient Instructions (Signed)
 Mr. [name] Benitez , Thank you for taking time to come for your Medicare Wellness Visit. I appreciate your ongoing commitment to your health goals. Please review the following plan we discussed and let me know if I can assist you in the future.   Screening recommendations/referrals: Colonoscopy: up to date Recommended yearly ophthalmology/optometry visit for glaucoma screening and checkup Recommended yearly dental visit for hygiene and checkup  Vaccinations: Influenza vaccine: up to date Pneumococcal vaccine: up to date Tdap vaccine: up to date Shingles vaccine: Education provided      Preventive Care 65 Years and Older, Male Preventive care refers to lifestyle choices and visits with your health care provider that can promote health and wellness. What does preventive care include? A yearly physical exam. This is also called an annual well check. Dental exams once or twice a year. Routine eye exams. Ask your health care provider how often you should have your eyes checked. Personal lifestyle choices, including: Daily care of your teeth and gums. Regular physical activity. Eating a healthy diet. Avoiding tobacco and drug use. Limiting alcohol use. Practicing safe sex. Taking low doses of aspirin every day. Taking vitamin and mineral supplements as recommended by your health care provider. What happens during an annual well check? The services and screenings done by your health care provider during your annual well check will depend on your age, overall health, lifestyle risk factors, and family history of disease. Counseling  Your health care provider may ask you questions about your: Alcohol use. Tobacco use. Drug use. Emotional well-being. Home and relationship well-being. Sexual activity. Eating habits. History of falls. Memory and ability to understand (cognition). Work and work Astronomer. Screening  You may have the following tests or measurements: Height, weight, and  BMI. Blood pressure. Lipid and cholesterol levels. These may be checked every 5 years, or more frequently if you are over 58 years old. Skin check. Lung cancer screening. You may have this screening every year starting at age 32 if you have a 30-pack-year history of smoking and currently smoke or have quit within the past 15 years. Fecal occult blood test (FOBT) of the stool. You may have this test every year starting at age 68. Flexible sigmoidoscopy or colonoscopy. You may have a sigmoidoscopy every 5 years or a colonoscopy every 10 years starting at age 18. Prostate cancer screening. Recommendations will vary depending on your family history and other risks. Hepatitis C blood test. Hepatitis B blood test. Sexually transmitted disease (STD) testing. Diabetes screening. This is done by checking your blood sugar (glucose) after you have not eaten for a while (fasting). You may have this done every 1-3 years. Abdominal aortic aneurysm (AAA) screening. You may need this if you are a current or former smoker. Osteoporosis. You may be screened starting at age 90 if you are at high risk. Talk with your health care provider about your test results, treatment options, and if necessary, the need for more tests. Vaccines  Your health care provider may recommend certain vaccines, such as: Influenza vaccine. This is recommended every year. Tetanus, diphtheria, and acellular pertussis (Tdap, Td) vaccine. You may need a Td booster every 10 years. Zoster vaccine. You may need this after age 42. Pneumococcal 13-valent conjugate (PCV13) vaccine. One dose is recommended after age 53. Pneumococcal polysaccharide (PPSV23) vaccine. One dose is recommended after age 33. Talk to your health care provider about which screenings and vaccines you need and how often you need them. This information is not intended to  replace advice given to you by your health care provider. Make sure you discuss any questions you have  with your health care provider. Document Released: 01/15/2015 Document Revised: 09/08/2015 Document Reviewed: 10/20/2014 Elsevier Interactive Patient Education  2017 [employer].  Fall Prevention in the Home Falls can cause injuries. They can happen to people of all ages. There are many things you can do to make your home safe and to help prevent falls. What can I do on the outside of my home? Regularly fix the edges of walkways and driveways and fix any cracks. Remove anything that might make you trip as you walk through a door, such as a raised step or threshold. Trim any bushes or trees on the path to your home. Use bright outdoor lighting. Clear any walking paths of anything that might make someone trip, such as rocks or tools. Regularly check to see if handrails are loose or broken. Make sure that both sides of any steps have handrails. Any raised decks and porches should have guardrails on the edges. Have any leaves, snow, or ice cleared regularly. Use sand or salt on walking paths during winter. Clean up any spills in your garage right away. This includes oil or grease spills. What can I do in the bathroom? Use night lights. Install grab bars by the toilet and in the tub and shower. Do not use towel bars as grab bars. Use non-skid mats or decals in the tub or shower. If you need to sit down in the shower, use a plastic, non-slip stool. Keep the floor dry. Clean up any water that spills on the floor as soon as it happens. Remove soap buildup in the tub or shower regularly. Attach bath mats securely with double-sided non-slip rug tape. Do not have throw rugs and other things on the floor that can make you trip. What can I do in the bedroom? Use night lights. Make sure that you have a light by your bed that is easy to reach. Do not use any sheets or blankets that are too big for your bed. They should not hang down onto the floor. Have a firm chair that has side arms. You can use  this for support while you get dressed. Do not have throw rugs and other things on the floor that can make you trip. What can I do in the kitchen? Clean up any spills right away. Avoid walking on wet floors. Keep items that you use a lot in easy-to-reach places. If you need to reach something above you, use a strong step stool that has a grab bar. Keep electrical cords out of the way. Do not use floor polish or wax that makes floors slippery. If you must use wax, use non-skid floor wax. Do not have throw rugs and other things on the floor that can make you trip. What can I do with my stairs? Do not leave any items on the stairs. Make sure that there are handrails on both sides of the stairs and use them. Fix handrails that are broken or loose. Make sure that handrails are as long as the stairways. Check any carpeting to make sure that it is firmly attached to the stairs. Fix any carpet that is loose or worn. Avoid having throw rugs at the top or bottom of the stairs. If you do have throw rugs, attach them to the floor with carpet tape. Make sure that you have a light switch at the top of the stairs and the  bottom of the stairs. If you do not have them, ask someone to add them for you. What else can I do to help prevent falls? Wear shoes that: Do not have high heels. Have rubber bottoms. Are comfortable and fit you well. Are closed at the toe. Do not wear sandals. If you use a stepladder: Make sure that it is fully opened. Do not climb a closed stepladder. Make sure that both sides of the stepladder are locked into place. Ask someone to hold it for you, if possible. Clearly mark and make sure that you can see: Any grab bars or handrails. First and last steps. Where the edge of each step is. Use tools that help you move around (mobility aids) if they are needed. These include: Canes. Walkers. Scooters. Crutches. Turn on the lights when you go into a dark area. Replace any light bulbs  as soon as they burn out. Set up your furniture so you have a clear path. Avoid moving your furniture around. If any of your floors are uneven, fix them. If there are any pets around you, be aware of where they are. Review your medicines with your doctor. Some medicines can make you feel dizzy. This can increase your chance of falling. Ask your doctor what other things that you can do to help prevent falls. This information is not intended to replace advice given to you by your health care provider. Make sure you discuss any questions you have with your health care provider. Document Released: 10/15/2008 Document Revised: 05/27/2015 Document Reviewed: 01/23/2014 Elsevier Interactive Patient Education  2017 [employer].

## 2023-06-19 NOTE — Progress Notes (Signed)
 Subjective:   [name] Benitez is a 68 y.o. male who presents for Medicare Annual/Subsequent preventive examination.  Visit Complete: Virtual I connected with  Bethany Broom on 06/19/23 by a audio enabled telemedicine application and verified that I am speaking with the correct person using two identifiers.  Patient Location: Home  Provider Location: Home Office  I discussed the limitations of evaluation and management by telemedicine. The patient expressed understanding and agreed to proceed.  Vital Signs: Because this visit was a virtual/telehealth visit, some criteria may be missing or patient reported. Any vitals not documented were not able to be obtained and vitals that have been documented are patient reported.   Cardiac Risk Factors include: advanced age (>72men, >91 women);male gender;hypertension;smoking/ tobacco exposure     Objective:    There were no vitals filed for this visit. There is no height or weight on file to calculate BMI.     06/19/2023   10:21 AM 06/01/2022    8:34 AM 05/26/2021    8:30 AM 12/08/2015   11:52 AM  Advanced Directives  Does Patient Have a Medical Advance Directive? No Yes No No   Type of IT consultant of Healthcare Power of Attorney in Chart?  No - copy requested    Would patient like information on creating a medical advance directive? No - Patient declined  No - Patient declined      Data saved with a previous flowsheet row definition    Current Medications (verified) Outpatient Encounter Medications as of 06/19/2023  Medication Sig   acetaminophen (TYLENOL) 500 MG tablet Take 500 mg by mouth 2 (two) times daily.   aspirin EC 81 MG tablet Take 81 mg by mouth daily.   benazepril  (LOTENSIN ) 20 MG tablet Take 20 mg by mouth daily.   Doxylamine Succinate, Sleep, (SLEEP AID PO) Take by mouth daily. 2 tablets at bedtime.   fish oil-omega-3 fatty acids 1000 MG capsule Take 1 g by mouth 2  (two) times daily.    losartan  (COZAAR ) 25 MG tablet TAKE 1 TABLET (25 MG TOTAL) BY MOUTH DAILY.   metoprolol  succinate (TOPROL -XL) 50 MG 24 hr tablet TAKE 1 TABLET BY MOUTH EVERY DAY WITH OR IMMEDIATELY FOLLOWING A MEAL   RABEprazole  (ACIPHEX ) 20 MG tablet TAKE 1 TABLET BY MOUTH EVERY DAY   sildenafil  (VIAGRA ) 100 MG tablet TAKE 0.5-1 TABLETS BY MOUTH DAILY AS NEEDED FOR ERECTILE DYSFUNCTION.   simvastatin  (ZOCOR ) 40 MG tablet TAKE 1 TABLET BY MOUTH EVERY DAY   valACYclovir  (VALTREX ) 500 MG tablet TAKE 1 TABLET (500 MG TOTAL) BY MOUTH DAILY. INCREASE TO TWICE A DAY DOSING FOR 3 DAYS IF SYMPTOMS OF FLARE.   No facility-administered encounter medications on file as of 06/19/2023.    Allergies (verified) Ace inhibitors   History: Past Medical History:  Diagnosis Date   CAD in native artery 10/2003   Essentially LAD occlusion with in-stent restenosis/thrombosis.  50% PDA--referred for CABG x2.   Coronary stent restenosis due to progression of disease 04/2009   Subtotal occlusion of the LAD with ISR, unable to cross with wire; aneurysmal dilation of the LAD prior to stent   Current occasional smoker    He initially quit around the time of his CABG, but he has gotten back to smoking maybe 1 or 2 a day as a stress relief.  Does not smoke every day.   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 Jens Molder)   cataract surgery  02/2022    COLONOSCOPY  2005   CORONARY ANGIOPLASTY WITH STENT PLACEMENT  10/26/2003   anterior ischemia with significant reversibility (mid anterior and anteroseptal, apical anterior and apical anteroseptal)  - Cypher 3x4mm stent to mid LAD and Cypher 3x29mm Cypher DES to prox LAD(Dr. Dina Francisco)   CORONARY ARTERY BYPASS GRAFT  04/2009   LIMA-LAD, reverse SVG-distal RCA (Dr. Laneta Pintos. Hendrickson)   DOPPLER ECHOCARDIOGRAPHY  04/2009   EF 50-50%, borderline septal hypokinesis   Exercise Tolerance Test  06/21/2013   Exercise 8:05 Min, 10.1 METs, reached 99% max. Peak HR -- 160 bpm; NEGATIVE Bruce TM GXT = no EKG evidence of ischemia. Prolonged heart rate and blood pressure recovery and PVCs noted that resolve with Exerction.   NM MYOVIEW  LTD  05/2018   EF 45%.  Low risk.  No ischemia or infarction.   Family History  Problem Relation Age of Onset   Hyperlipidemia Father    Cancer Maternal Grandmother    Cancer Paternal Grandfather    Colon cancer Neg Hx    Colon polyps Neg Hx    Esophageal cancer Neg Hx    Rectal cancer Neg Hx    Stomach cancer Neg Hx    Social History   Socioeconomic History   Marital status: Divorced    Spouse name: Not on file   Number of children: 1   Years of education: 11   Highest education level: Not on file  Occupational History    Employer: [employer]  Tobacco Use   Smoking status: Every Day    Current packs/day: 0.50    Average packs/day: 0.5 packs/day for 50.0 years (25.0 ttl pk-yrs)    Types: Cigarettes   Smokeless tobacco: Never   Tobacco comments:    12 Cigarettes daily; last attempt at quitting 2005  Vaping Use   Vaping status: Never Used  Substance and Sexual Activity   Alcohol use: Yes    Comment: occas   Drug use: No   Sexual activity: Yes    Birth control/protection: Condom  Other Topics Concern   Not on file  Social History Narrative   Divorced father of one, grandfather 2.  Works for [employer] in [place]. Does not  get routine activity/exercise -- Basically, he is not motivated to do any exercise. He, by himself, is admittedly lazy. Does not drink alcohol.   Social Drivers of Corporate investment banker Strain: Low Risk  (06/01/2022)   Overall Financial Resource Strain (CARDIA)    Difficulty of Paying Living Expenses: Not very hard  Food Insecurity: No Food Insecurity (06/01/2022)   Hunger Vital Sign    Worried About Running Out of Food in the Last Year: Never true    Ran Out of Food in the Last Year: Never true  Transportation Needs: No Transportation Needs (06/01/2022)   PRAPARE -  Administrator, Civil Service (Medical): No    Lack of Transportation (Non-Medical): No  Physical Activity: Inactive (06/01/2022)   Exercise Vital Sign    Days of Exercise per Week: 0 days    Minutes of Exercise per Session: 0 min  Stress: No Stress Concern Present (06/01/2022)   [employer] of Occupational Health - Occupational Stress Questionnaire    Feeling of Stress : Not at all  Social Connections: Moderately Isolated (06/01/2022)   Social Connection and Isolation Panel    Frequency of Communication with Friends and Family: Twice a week    Frequency of Social Gatherings with Friends and Family: Twice a week    Attends Religious Services: Never    Database administrator or Organizations: Yes    Attends Engineer, structural: 1 to 4 times per year    Marital Status: Divorced    Tobacco Counseling Ready to quit: Not Answered Counseling given: Not Answered Tobacco comments: 12 Cigarettes daily; last attempt at quitting 2005   Clinical Intake:  Pre-visit preparation completed: Yes  Pain : No/denies pain     Diabetes: No  How often do you need to have someone help you when you read instructions, pamphlets, or other written materials from your doctor or pharmacy?: 1 - Never  Interpreter Needed?: No  Information entered by :: Kieth Pelt LPN   Activities of Daily Living     06/19/2023   10:21 AM  In your present state of health, do you have any difficulty performing the following activities:  Hearing? 0  Vision? 0  Difficulty concentrating or making decisions? 0  Walking or climbing stairs? 0  Dressing or bathing? 0  Doing errands, shopping? 0  Preparing Food and eating ? N  Using the Toilet? N  In the past six months, have you accidently leaked urine? N  Do you have problems with loss of bowel control? N  Managing your Medications? N  Managing your Finances? N  Housekeeping or managing your Housekeeping? N    Patient Care Team: Benjiman Bras, MD as PCP - General (Family Medicine) Arleen Lacer, MD as PCP - Cardiology (Cardiology) Arleen Lacer, MD as Consulting Physician (Cardiology) Rolando Cliche, [facility] as Pharmacist (Pharmacist)  Indicate any recent Medical Services you may have received from other than [facility] providers in the past year (date may be approximate).     Assessment:   This is a routine wellness examination for [facility].  Hearing/Vision screen Hearing Screening - Comments:: Does not wear hearing aids Vision Screening - Comments:: Not to date    Goals Addressed             This Visit's Progress    Patient Stated   On track    Stay healthy     Patient Stated   On track    Stay healthy     Patient Stated       Would like to retire       Depression Screen    06/19/2023   10:40 AM 06/01/2022    8:54 AM 03/29/2022    3:47 PM 01/18/2022    2:34 PM 09/09/2021    9:01 AM 05/26/2021    8:27 AM 02/09/2021    3:11 PM  PHQ 2/9 Scores  PHQ - 2 Score 0 0 0 0 0 0 0  PHQ- 9 Score 2 0 0 0 0  2    Fall Risk    06/19/2023   10:27  AM 06/01/2022    8:34 AM 03/29/2022    3:47 PM 01/18/2022    2:34 PM 09/09/2021    9:01 AM  Fall Risk   Falls in the past year? 0 0 0 0 0  Number falls in past yr: 0 0 0 0 0  Injury with Fall? 0 0 0 0 0  Risk for fall due to :   No Fall Risks No Fall Risks No Fall Risks  Follow up Falls evaluation  completed;Education provided;Falls prevention discussed Falls evaluation completed;Education provided;Falls prevention discussed  Falls evaluation completed  Falls evaluation completed      Data saved with a previous flowsheet row definition    MEDICARE RISK AT HOME: Medicare Risk at Home Any stairs in or around the home?: Yes If so, are there any without handrails?: No Home free of loose throw rugs in walkways, pet beds, electrical cords, etc?: Yes Adequate lighting in your home to reduce risk of falls?: Yes Life alert?: No Use of a cane, walker or w/c?: No Grab bars in the bathroom?: Yes Shower chair or bench in shower?: No Elevated toilet seat or a handicapped toilet?: Yes  TIMED UP AND GO:  Was the test performed?  No    Cognitive Function:        06/19/2023   10:24 AM 06/01/2022    8:38 AM 05/26/2021    8:34 AM  6CIT Screen  What Year? 0 points 0 points 0 points  What month? 0 points 0 points 0 points  What time? 0 points 0 points 0 points  Count back from 20 0 points 0 points 0 points  Months in reverse 0 points 0 points 0 points  Repeat phrase 0 points  0 points  Total Score 0 points  0 points    Immunizations Immunization History  Administered Date(s) Administered   Fluad Quad(high Dose 65+) 09/09/2021   Influenza Inj Mdck Quad Pf 10/03/2017   Influenza,inj,Quad PF,6+ Mos 09/21/2018, 10/03/2019   Influenza-Unspecified 12/01/2020   Moderna Sars-Covid-2 Vaccination 03/29/2019, 04/26/2019   PFIZER(Purple Top)SARS-COV-2 Vaccination 11/07/2019   PNEUMOCOCCAL CONJUGATE-20 09/09/2021   Pfizer Covid-19 Vaccine Bivalent Booster 77yrs & up 02/25/2021   Pfizer(Comirnaty)Fall Seasonal Vaccine 12 years and older 11/24/2022   Tdap 11/17/2019    TDAP status: Up to date  Flu Vaccine status: Up to date  Pneumococcal vaccine status: Up to date  Covid-19 vaccine status: Information provided on how to obtain vaccines.   Qualifies for Shingles Vaccine? Yes   Zostavax  completed No   Shingrix Completed?: No.    Education has been provided regarding the importance of this vaccine. Patient has been advised to call insurance company to determine out of pocket expense if they have not yet received this vaccine. Advised may also receive vaccine at local pharmacy or Health Dept. Verbalized acceptance and understanding.  Screening Tests Health Maintenance  Topic Date Due   COVID-19 Vaccine (6 - Mixed Product risk 2024-25 season) 05/24/2023   Colonoscopy  07/20/2023   Zoster Vaccines- Shingrix (1 of 2) 09/19/2023 (Originally 01/05/1974)   Lung Cancer Screening  06/18/2024 (Originally 01/05/2005)   INFLUENZA VACCINE  08/03/2023   Medicare Annual Wellness (AWV)  06/18/2024   DTaP/Tdap/Td (2 - Td or Tdap) 11/16/2029   Pneumococcal Vaccine: 50+ Years  Completed   Hepatitis C Screening  Completed   HPV VACCINES  Aged Out   Meningococcal B Vaccine  Aged Out    Health Maintenance  Health Maintenance Due  Topic Date Due  COVID-19 Vaccine (6 - Mixed Product risk 2024-25 season) 05/24/2023   Colonoscopy  07/20/2023    Colorectal cancer screening: Type of screening: Colonoscopy. Completed 2024. Repeat every 5 years  Lung Cancer Screening: (Low Dose CT Chest recommended if Age 84-80 years, 20 pack-year currently smoking OR have quit w/in 15years.) does qualify.   Lung Cancer Screening Referral:   Education provided  declined at this time  Additional Screening:  Hepatitis C Screening: does not qualify; Completed 2012  Vision Screening: Recommended annual ophthalmology exams for early detection of glaucoma and other disorders of the eye. Is the patient up to date with their annual eye exam?  No  Who is the provider or what is the name of the office in which the patient attends annual eye exams? Education provided If pt is not established with a provider, would they like to be referred to a provider to establish care? No .   Dental Screening: Recommended annual  dental exams for proper oral hygiene    Community Resource Referral / Chronic Care Management: CRR required this visit?  No   CCM required this visit?  No     Plan:     I have personally reviewed and noted the following in the patient's chart:   Medical and social history Use of alcohol, tobacco or illicit drugs  Current medications and supplements including opioid prescriptions. Patient is not currently taking opioid prescriptions. Functional ability and status Nutritional status Physical activity Advanced directives List of other physicians Hospitalizations, surgeries, and ER visits in previous 12 months Vitals Screenings to include cognitive, depression, and falls Referrals and appointments  In addition, I have reviewed and discussed with patient certain preventive protocols, quality metrics, and best practice recommendations. A written personalized care plan for preventive services as well as general preventive health recommendations were provided to patient.     Kieth Pelt, LPN   1/61/0960   After Visit Summary: (MyChart) Due to this being a telephonic visit, the after visit summary with patients personalized plan was offered to patient via MyChart   Nurse Notes:

## 2023-06-25 ENCOUNTER — Other Ambulatory Visit: Payer: Self-pay | Admitting: Cardiology

## 2023-06-25 DIAGNOSIS — Z024 Encounter for examination for driving license: Secondary | ICD-10-CM

## 2023-06-25 DIAGNOSIS — I2510 Atherosclerotic heart disease of native coronary artery without angina pectoris: Secondary | ICD-10-CM

## 2023-06-25 DIAGNOSIS — Z951 Presence of aortocoronary bypass graft: Secondary | ICD-10-CM

## 2023-06-27 ENCOUNTER — Ambulatory Visit (HOSPITAL_COMMUNITY)
Admission: RE | Admit: 2023-06-27 | Discharge: 2023-06-27 | Disposition: A | Discharge status: Home / Self Care | Payer: Medicare HMO | Source: Ambulatory Visit | Attending: Cardiology | Admitting: Cardiology

## 2023-06-27 DIAGNOSIS — I2510 Atherosclerotic heart disease of native coronary artery without angina pectoris: Secondary | ICD-10-CM | POA: Insufficient documentation

## 2023-06-27 DIAGNOSIS — Z024 Encounter for examination for driving license: Secondary | ICD-10-CM | POA: Insufficient documentation

## 2023-06-27 DIAGNOSIS — Z951 Presence of aortocoronary bypass graft: Secondary | ICD-10-CM | POA: Insufficient documentation

## 2023-06-27 MED ORDER — TECHNETIUM TC 99M TETROFOSMIN IV KIT
10.5000 | PACK | Freq: Once | INTRAVENOUS | Status: AC | PRN
  Administered 2023-06-27: 10.5 via INTRAVENOUS

## 2023-06-27 MED ORDER — TECHNETIUM TC 99M TETROFOSMIN IV KIT
30.5000 | PACK | Freq: Once | INTRAVENOUS | Status: AC | PRN
  Administered 2023-06-27: 30.5 via INTRAVENOUS

## 2023-06-28 ENCOUNTER — Ambulatory Visit: Payer: Self-pay | Admitting: Cardiology

## 2023-06-28 LAB — MYOCARDIAL PERFUSION IMAGING
Angina Index: 0
Base ST Depression (mm): 0 mm
Duke Treadmill Score: 6
Estimated workload: 7
Exercise duration (min): 6 min
Exercise duration (sec): 0 s
LV dias vol: 64 mL (ref 62–150)
LV sys vol: 20 mL (ref 4.2–5.8)
MPHR: 152 {beats}/min
Nuc Stress EF: 69 %
Peak HR: 142 {beats}/min
Percent HR: 93 %
Rest HR: 95 {beats}/min
Rest Nuclear Isotope Dose: 10.5 mCi
SDS: 0
SRS: 0
SSS: 0
ST Depression (mm): 0 mm
Stress Nuclear Isotope Dose: 30.5 mCi
TID: 0.97

## 2023-07-01 ENCOUNTER — Ambulatory Visit: Payer: Self-pay | Admitting: Cardiology

## 2023-07-03 NOTE — Telephone Encounter (Signed)
 Larger hiatal hernia noted on recent myoview .  I know he has used Aciphex  for heartburn.  Could certainly meet with gastroenterology or general surgery to discuss repair or treatment of the hernia hernia.  He has not seen me since last year.  Please schedule follow-up appointment to discuss his chronic medications and the hiatal hernia noted on imaging.

## 2023-07-04 NOTE — Progress Notes (Signed)
 Called, LM to return call to scheduled appt - also sent MyChart msg

## 2023-07-05 NOTE — Telephone Encounter (Signed)
-----   Message from [name] JONELLE Pines, MD sent at 07/03/2023  3:35 PM EDT -----   ----- Message ----- From: Anner Alm ORN, MD Sent: 07/01/2023   6:10 PM EDT To: Reena Gladis GAILS, RN; [name] JONELLE Pines, MD  Additional data from the recent Myoview  stress test suggest a large hiatal hernia.  This may be something to bring up with PCP.  Alm Anner, MD 40 ----- Message ----- From: Interface, Rad Results In Sent: 06/30/2023  12:54 PM EDT To: Alm ORN Anner, MD

## 2023-07-05 NOTE — Telephone Encounter (Signed)
 Lvm for patient to return call to schedule an appt with Dr. Levora

## 2023-07-05 NOTE — Progress Notes (Signed)
Patient has been scheduled for 7/14.

## 2023-07-16 ENCOUNTER — Encounter: Payer: Self-pay | Admitting: Family Medicine

## 2023-07-16 ENCOUNTER — Ambulatory Visit (INDEPENDENT_AMBULATORY_CARE_PROVIDER_SITE_OTHER): Admitting: Family Medicine

## 2023-07-16 VITALS — BP 124/78 | HR 80 | Temp 98.0°F | Ht 67.0 in | Wt 194.2 lb

## 2023-07-16 DIAGNOSIS — R12 Heartburn: Secondary | ICD-10-CM

## 2023-07-16 DIAGNOSIS — H9319 Tinnitus, unspecified ear: Secondary | ICD-10-CM | POA: Diagnosis not present

## 2023-07-16 DIAGNOSIS — Z87891 Personal history of nicotine dependence: Secondary | ICD-10-CM

## 2023-07-16 DIAGNOSIS — I10 Essential (primary) hypertension: Secondary | ICD-10-CM

## 2023-07-16 DIAGNOSIS — N5201 Erectile dysfunction due to arterial insufficiency: Secondary | ICD-10-CM | POA: Diagnosis not present

## 2023-07-16 DIAGNOSIS — Z951 Presence of aortocoronary bypass graft: Secondary | ICD-10-CM

## 2023-07-16 DIAGNOSIS — R7303 Prediabetes: Secondary | ICD-10-CM

## 2023-07-16 DIAGNOSIS — K219 Gastro-esophageal reflux disease without esophagitis: Secondary | ICD-10-CM

## 2023-07-16 DIAGNOSIS — K449 Diaphragmatic hernia without obstruction or gangrene: Secondary | ICD-10-CM | POA: Diagnosis not present

## 2023-07-16 DIAGNOSIS — H9190 Unspecified hearing loss, unspecified ear: Secondary | ICD-10-CM | POA: Diagnosis not present

## 2023-07-16 MED ORDER — METOPROLOL SUCCINATE ER 50 MG PO TB24: ORAL_TABLET | ORAL | 2 refills | Status: DC

## 2023-07-16 MED ORDER — SILDENAFIL CITRATE 100 MG PO TABS: ORAL_TABLET | ORAL | 11 refills | Status: AC

## 2023-07-16 MED ORDER — LOSARTAN POTASSIUM 25 MG PO TABS: 25.0000 mg | ORAL_TABLET | Freq: Every day | ORAL | 0 refills | Status: DC

## 2023-07-16 MED ORDER — RABEPRAZOLE SODIUM 20 MG PO TBEC: 20.0000 mg | DELAYED_RELEASE_TABLET | Freq: Every day | ORAL | 3 refills | Status: AC

## 2023-07-16 NOTE — Patient Instructions (Addendum)
 Great work on trying to quit smoking - we are proud of you!  See info below on ways to help.  See info below on hiatal hernia. If heartburn control worsens, we can refer you to specialist to discuss possible treatment. Will discuss further at next visit.  I will refer you to audiology for hearing testing as that is a common cause of ringing in the ears.  See information below. Bring your medications with you to your next visit, if any concerns on labs I will let you know, but we can discuss those at follow-up as well.  Thank you again for coming in today and please let me know if there are any questions or concerns.   Hiatal Hernia  A hiatal hernia occurs when part of the stomach slides above the muscle that separates the abdomen from the chest (diaphragm). A person can be born with a hiatal hernia (congenital), or it may develop over time. In almost all cases of hiatal hernia, only the top part of the stomach pushes through the diaphragm. Many people have a hiatal hernia with no symptoms. The larger the hernia, the more likely it is that you will have symptoms. In some cases, a hiatal hernia allows stomach acid to flow back into the tube that carries food from your mouth to your stomach (esophagus). This may cause heartburn symptoms. The development of heartburn symptoms may mean that you have a condition called gastroesophageal reflux disease (GERD). What are the causes? This condition is caused by a weakness in the opening (hiatus) where the esophagus passes through the diaphragm to attach to the upper part of the stomach. A person may be born with a weakness in the hiatus, or a weakness can develop over time. What increases the risk? This condition is more likely to develop in: Older people. Age is a major risk factor for a hiatal hernia, especially if you are over the age of 49. Pregnant women. People who are overweight. People who have frequent constipation. What are the signs or  symptoms? Symptoms of this condition usually develop in the form of GERD symptoms. Symptoms include: Heartburn. Upset stomach (indigestion). Trouble swallowing. Coughing or wheezing. Wheezing is making high-pitched whistling sounds when you breathe. Sore throat. Chest pain. Nausea and vomiting. How is this diagnosed? This condition may be diagnosed during testing for GERD. Tests that may be done include: X-rays of your stomach or chest. An upper gastrointestinal (GI) series. This is an X-ray exam of your GI tract that is taken after you swallow a chalky liquid that shows up clearly on the X-ray. Endoscopy. This is a procedure to look into your stomach using a thin, flexible tube that has a tiny camera and light on the end of it. How is this treated? This condition may be treated by: Dietary and lifestyle changes to help reduce GERD symptoms. Medicines. These may include: Over-the-counter antacids. Medicines that make your stomach empty more quickly. Medicines that block the production of stomach acid (H2 blockers). Stronger medicines to reduce stomach acid (proton pump inhibitors). Surgery to repair the hernia, if other treatments are not helping. If you have no symptoms, you may not need treatment. Follow these instructions at home: Lifestyle and activity Do not use any products that contain nicotine or tobacco. These products include cigarettes, chewing tobacco, and vaping devices, such as e-cigarettes. If you need help quitting, ask your health care provider. Try to achieve and maintain a healthy body weight. Avoid putting pressure on your abdomen.  Anything that puts pressure on your abdomen increases the amount of acid that may be pushed up into your esophagus. Avoid bending over, especially after eating. Raise the head of your bed by putting blocks under the legs. This keeps your head and esophagus higher than your stomach. Do not wear tight clothing around your chest or  stomach. Try not to strain when having a bowel movement, when urinating, or when lifting heavy objects. Eating and drinking Avoid foods that can worsen GERD symptoms. These may include: Fatty foods, like fried foods. Citrus fruits, like oranges or lemon. Other foods and drinks that contain acid, like orange juice or tomatoes. Spicy food. Chocolate. Eat frequent small meals instead of three large meals a day. This helps prevent your stomach from getting too full. Eat slowly. Do not lie down right after eating. Do not eat 1-2 hours before bed. Do not drink beverages with caffeine. These include cola, coffee, cocoa, and tea. Do not drink alcohol. General instructions Take over-the-counter and prescription medicines only as told by your health care provider. Keep all follow-up visits. Your health care provider will want to check that any new prescribed medicines are helping your symptoms. Contact a health care provider if: Your symptoms are not controlled with medicines or lifestyle changes. You are having trouble swallowing. You have coughing or wheezing that will not go away. Your pain is getting worse. Your pain spreads to your arms, neck, jaw, teeth, or back. You feel nauseous or you vomit. Get help right away if: You have shortness of breath. You vomit blood. You have bright red blood in your stools. You have black, tarry stools. These symptoms may be an emergency. Get help right away. Call 911. Do not wait to see if the symptoms will go away. Do not drive yourself to the hospital. Summary A hiatal hernia occurs when part of the stomach slides above the muscle that separates the abdomen from the chest. A person may be born with a weakness in the hiatus, or a weakness can develop over time. Symptoms of a hiatal hernia may include heartburn, trouble swallowing, or sore throat. Management of a hiatal hernia includes eating frequent small meals instead of three large meals a  day. Get help right away if you vomit blood, have bright red blood in your stools, or have black, tarry stools. This information is not intended to replace advice given to you by your health care provider. Make sure you discuss any questions you have with your health care provider. Document Revised: 02/15/2021 Document Reviewed: 02/15/2021 Elsevier Patient Education  2024 [employer]  Tinnitus Tinnitus is when you hear a sound that there's no actual source for. It may sound like ringing in your ears or something else. The sound may be loud, soft, or somewhere in between. It can last for a few seconds or be constant for days. It can come and go. Almost everyone has tinnitus at some point. It's not the same as hearing loss. But you may need to see a health care provider if: It lasts for a long time. It comes back often. You have trouble sleeping and focusing. What are the causes? The cause of tinnitus is often unknown. In some cases, you may get it if: You're around loud noises, such as from machines or music. An object gets stuck in your ear. Earwax builds up in Landscape architect. You drink a lot of alcohol or caffeine. You take certain medicines. You start to lose your hearing. You may  also get it from some medical conditions. These may include: Ear or sinus infections. Heart diseases. High blood pressure. Allergies. Mnire's disease. Problems with your thyroid . A tumor. This is a growth of cells that isn't normal. A weak, bulging blood vessel called an aneurysm near your ear. What increases the risk? You may be more likely to get tinnitus if: You're around loud noises a lot. You're older. You drink alcohol. You smoke. What are the signs or symptoms? The main symptom is hearing a sound that there's no source for. It may sound like ringing. It may also sound like: Buzzing. Sizzling. Blowing air. Hissing. Whistling. Other sounds may include: Roaring. Running water. A musical  note. Tapping. Humming. You may have symptoms in one ear or both ears. How is this diagnosed? Tinnitus is diagnosed based on your symptoms, your medical history, and an exam. Your provider may do a full hearing test if your tinnitus: Is in just one ear. Makes it hard for you to hear. Lasts 6 months or longer. You may also need to see an expert in hearing disorders called an audiologist. They may ask you about your symptoms and how tinnitus affects your daily life. You may have other tests done. These may include: A CT scan. An MRI. An angiogram. This shows how blood flows through your blood vessels. How is this treated? Treatment may include: Therapy to help you manage the stress of living with tinnitus. Finding ways to mask or cover the sound of tinnitus. These include: Sound or white noise machines. Devices that fit in your ear and play sounds or music. A loud humidifier. Acoustic neural stimulation. This is when you use headphones to listen to music that has a special signal in it. Over time, this signal may change some of the pathways in your brain. This can make you less sensitive to tinnitus. This treatment is used for very severe cases. Using hearing aids or cochlear implants if your tinnitus is from hearing loss. If your tinnitus is caused by a medical condition, treating the condition may make it go away.  Follow these instructions at home: Managing symptoms     Try to avoid being in loud places or around loud noises. Wear earplugs or headphones when you're around loud noises. Find ways to reduce stress. These may include meditation, yoga, or deep breathing. Sleep with your head slightly raised. General instructions Take over-the-counter and prescription medicines only as told by your provider. Track the things that cause symptoms (triggers). Try to avoid these things. To stop your tinnitus from getting worse: Do not drink alcohol. Do not have caffeine. Do not use any  products that contain nicotine or tobacco. These products include cigarettes, chewing tobacco, and vaping devices, such as e-cigarettes. If you need help quitting, ask your provider. Avoid using too much salt. Get enough sleep each night. Where to find more information American Tinnitus Association: https://www.johnson-hamilton.org/ Contact a health care provider if: Your symptoms last for 3 weeks or longer without stopping. You have sudden hearing loss. Your symptoms get worse or don't get better with home care. You can't manage the stress of living with tinnitus. Get help right away if: You get tinnitus after a head injury. You have tinnitus and: Dizziness. Nausea and vomiting. Loss of balance. A sudden, severe headache. Changes to your eyesight. Weakness in your face, arms, or legs. These symptoms may be an emergency. Get help right away. Call 911. Do not wait to see if the symptoms will go away.  Do not drive yourself to the hospital. This information is not intended to replace advice given to you by your health care provider. Make sure you discuss any questions you have with your health care provider. Document Revised: 03/27/2022 Document Reviewed: 03/27/2022 Elsevier Patient Education  2024 Else[employer] Inc.   Managing the Challenge of Quitting Smoking Quitting smoking is a physical and mental challenge. You may have cravings, withdrawal symptoms, and temptation to smoke. Before quitting, work with your health care provider to make a plan that can help you manage quitting. Making a plan before you quit may keep you from smoking when you have the urge to smoke while trying to quit. How to manage lifestyle changes Managing stress Stress can make you want to smoke, and wanting to smoke may cause stress. It is important to find ways to manage your stress. You could try some of the following: Practice relaxation techniques. Breathe slowly and deeply, in through your nose and out through your mouth. Listen to  music. Soak in a bath or take a shower. Imagine a peaceful place or vacation. Get some support. Talk with family or friends about your stress. Join a support group. Talk with a counselor or therapist. Get some physical activity. Go for a walk, run, or bike ride. Play a favorite sport. Practice yoga.  Medicines Talk with your health care provider about medicines that might help you deal with cravings and make quitting easier for you. Relationships Social situations can be difficult when you are quitting smoking. To manage this, you can: Avoid parties and other social situations where people might be smoking. Avoid alcohol. Leave right away if you have the urge to smoke. Explain to your family and friends that you are quitting smoking. Ask for support and let them know you might be a bit grumpy. Plan activities where smoking is not an option. General instructions Be aware that many people gain weight after they quit smoking. However, not everyone does. To keep from gaining weight, have a plan in place before you quit, and stick to the plan after you quit. Your plan should include: Eating healthy snacks. When you have a craving, it may help to: Eat popcorn, or try carrots, celery, or other cut vegetables. Chew sugar-free gum. Changing how you eat. Eat small portion sizes at meals. Eat 4-6 small meals throughout the day instead of 1-2 large meals a day. Be mindful when you eat. You should avoid watching television or doing other things that might distract you as you eat. Exercising regularly. Make time to exercise each day. If you do not have time for a long workout, do short bouts of exercise for 5-10 minutes several times a day. Do some form of strengthening exercise, such as weight lifting. Do some exercise that gets your heart beating and causes you to breathe deeply, such as walking fast, running, swimming, or biking. This is very important. Drinking plenty of water or other  low-calorie or no-calorie drinks. Drink enough fluid to keep your urine pale yellow.  How to recognize withdrawal symptoms Your body and mind may experience discomfort as you try to get used to not having nicotine in your system. These effects are called withdrawal symptoms. They may include: Feeling hungrier than normal. Having trouble concentrating. Feeling irritable or restless. Having trouble sleeping. Feeling depressed. Craving a cigarette. These symptoms may surprise you, but they are normal to have when quitting smoking. To manage withdrawal symptoms: Avoid places, people, and activities that trigger your cravings. Remember  why you want to quit. Get plenty of sleep. Avoid coffee and other drinks that contain caffeine. These may worsen some of your symptoms. How to manage cravings Come up with a plan for how to deal with your cravings. The plan should include the following: A definition of the specific situation you want to deal with. An activity or action you will take to replace smoking. A clear idea for how this action will help. The name of someone who could help you with this. Cravings usually last for 5-10 minutes. Consider taking the following actions to help you with your plan to deal with cravings: Keep your mouth busy. Chew sugar-free gum. Suck on hard candies or a straw. Brush your teeth. Keep your hands and body busy. Change to a different activity right away. Squeeze or play with a ball. Do an activity or a hobby, such as making bead jewelry, practicing needlepoint, or working with wood. Mix up your normal routine. Take a short exercise break. Go for a quick walk, or run up and down stairs. Focus on doing something kind or helpful for someone else. Call a friend or family member to talk during a craving. Join a support group. Contact a quitline. Where to find support To get help or find a support group: Call the National Cancer Institute's Smoking Quitline:  1-800-QUIT-NOW (818)559-1604) Text QUIT to SmokefreeTXT: 521151 Where to find more information Visit these websites to find more information on quitting smoking: U.[place]. Department of Health and Human Services: www.smokefree.gov American Lung Association: www.freedomfromsmoking.org Centers for Disease Control and Prevention (CDC): FootballExhibition.com.br American Heart Association: www.heart.org Contact a health care provider if: You want to change your plan for quitting. The medicines you are taking are not helping. Your eating feels out of control or you cannot sleep. You feel depressed or become very anxious. Summary Quitting smoking is a physical and mental challenge. You will face cravings, withdrawal symptoms, and temptation to smoke again. Preparation can help you as you go through these challenges. Try different techniques to manage stress, handle social situations, and prevent weight gain. You can deal with cravings by keeping your mouth busy (such as by chewing gum), keeping your hands and body busy, calling family or friends, or contacting a quitline for people who want to quit smoking. You can deal with withdrawal symptoms by avoiding places where people smoke, getting plenty of rest, and avoiding drinks that contain caffeine. This information is not intended to replace advice given to you by your health care provider. Make sure you discuss any questions you have with your health care provider. Document Revised: 12/10/2020 Document Reviewed: 12/10/2020 Elsevier Patient Education  2024 [employer].

## 2023-07-16 NOTE — Progress Notes (Unsigned)
 Subjective:  Patient ID: [name] Benitez, male    DOB: [birth year]  Age: 68 y.o. MRN: [id number]  CC:  Chief Complaint  Patient presents with   Medical Management of Chronic Issues    Pt is here for med mgnt Last seen 04/2022 Pt has C/O of ringing in his ears. Pt reports he has hernia . Pt reports Dr. Lesli has dx with. He states he is suppose to F/U with Dr.Sarha Bartelt on this    HPI [name] Benitez presents for  Follow-up of chronic conditions.  Have not seen him since April 2024 for acute issues at that time.  Last physical in March 2024. Denies barriers to follow up.   Acute concerns today of tinnitus and possible hernia.   Tinnitus: past 4 months, wind sound in both ears - steady buzzing sound.  No pulsatile tinnitus. Some decreased hearing at times with ringing sound.   Hypertension, hyperlipidemia History of CAD, cardiologist Dr. Anner, treated with Losartan , metoprolol , aspirin, and he is on simvastatin  40 mg daily for hyperlipidemia. Last visit with Dr. Anner in June 2024.  Home readings: none BP Readings from Last 3 Encounters:  07/16/23 124/78  07/20/22 (!) 87/66  06/27/22 110/78   Lab Results  Component Value Date   CREATININE 0.93 04/04/2022   Prediabetes: Diet/exercise approach. Weight increased. Denies new blurry vision or increased thirst.  Lab Results  Component Value Date   HGBA1C 6.0 04/04/2022   Wt Readings from Last 3 Encounters:  07/16/23 194 lb 4 oz (88.1 kg)  06/27/23 169 lb (76.7 kg)  07/20/22 167 lb (75.8 kg)   GERD: Treated with Aciphex  previously. Large hiatal hernia noted on recent stress testing. Taking aciphex  daily. Only rare breakthrough heartburn if eating late or certain foods. Takes 2nd pill every few months. No chest pains.   Tobacco abuse.  Has tried to quit - off for 2.5 months, then back on, now back off for 1.5 weeks.    History Patient Active Problem List   Diagnosis Date Noted   Encounter for Department of  Transportation (DOT) examination for driving license renewal 98/89/7979   Nocturia 05/08/2013   Atherosclerosis of native coronary artery without angina pectoris    Essential hypertension    Dyslipidemia, at goal LDL below 70    Erectile dysfunction    CAD  - S/P urgent CABG x 2 for subtotal occlusion of the LAD in a stented segment that was unable to be recanalized percutaneously 04/02/2009   Past Medical History:  Diagnosis Date   CAD in native artery 10/2003   Essentially LAD occlusion with in-stent restenosis/thrombosis.  50% PDA--referred for CABG x2.   Coronary stent restenosis due to progression of disease 04/2009   Subtotal occlusion of the LAD with ISR, unable to cross with wire; aneurysmal dilation of the LAD prior to stent   Current occasional smoker    He initially quit around the time of his CABG, but he has gotten back to smoking maybe 1 or 2 a day as a stress relief.  Does not smoke every day.   Dyslipidemia, goal LDL below 70    Erectile dysfunction    History of GI bleed    No recurrence   History of vertigo    Hypertension    S/P CABG x 2 04/2009   LIMA-LAD, SVG-RPDA (to bypass a roughly 50% lesion); post CABG stress normal with no ischemia   ST elevation (STEMI) myocardial infarction involving left anterior descending coronary artery (HCC)  10/2003   LAD PCI with 2 overlapping Cypher DES 3.0 mm 23 mm   Past Surgical History:  Procedure Laterality Date   CARDIAC CATHETERIZATION  04/28/2009   Subtotal occlusion of mid LAD with ISR/thrombosis, unable to pass --> urgent CABG (Dr. DOROTHA Gouty)   cataract surgery  02/2022   COLONOSCOPY  2005   CORONARY ANGIOPLASTY WITH STENT PLACEMENT  10/26/2003   anterior ischemia with significant reversibility (mid anterior and anteroseptal, apical anterior and apical anteroseptal)  - Cypher 3x34mm stent to mid LAD and Cypher 3x26mm Cypher DES to prox LAD(Dr. FABIENE Hasten)   CORONARY ARTERY BYPASS GRAFT  04/2009   LIMA-LAD, reverse  SVG-distal RCA (Dr. GORMAN. Hendrickson)   DOPPLER ECHOCARDIOGRAPHY  04/2009   EF 50-50%, borderline septal hypokinesis   Exercise Tolerance Test  06/21/2013   Exercise 8:05 Min, 10.1 METs, reached 99% max. Peak HR -- 160 bpm; NEGATIVE Bruce TM GXT = no EKG evidence of ischemia. Prolonged heart rate and blood pressure recovery and PVCs noted that resolve with Exerction.   NM MYOVIEW  LTD  05/2018   EF 45%.  Low risk.  No ischemia or infarction.   Allergies  Allergen Reactions   Ace Inhibitors Other (See Comments)    Angioedema 04/05/22.    Prior to Admission medications   Medication Sig Start Date End Date Taking? Authorizing Provider  acetaminophen (TYLENOL) 500 MG tablet Take 500 mg by mouth 2 (two) times daily.   Yes [provider]  aspirin EC 81 MG tablet Take 81 mg by mouth daily.   Yes [provider]  fish oil-omega-3 fatty acids 1000 MG capsule Take 1 g by mouth 2 (two) times daily.    Yes [provider]  losartan  (COZAAR ) 25 MG tablet TAKE 1 TABLET (25 MG TOTAL) BY MOUTH DAILY. 01/19/23  Yes Levora Reyes SAUNDERS, MD  metoprolol  succinate (TOPROL -XL) 50 MG 24 hr tablet TAKE 1 TABLET BY MOUTH EVERY DAY WITH OR IMMEDIATELY FOLLOWING A MEAL 01/01/23  Yes Levora Reyes SAUNDERS, MD  RABEprazole  (ACIPHEX ) 20 MG tablet TAKE 1 TABLET BY MOUTH EVERY DAY 11/15/22  Yes Levora Reyes SAUNDERS, MD  sildenafil  (VIAGRA ) 100 MG tablet TAKE 0.5-1 TABLETS BY MOUTH DAILY AS NEEDED FOR ERECTILE DYSFUNCTION. 11/24/22  Yes Levora Reyes SAUNDERS, MD  simvastatin  (ZOCOR ) 40 MG tablet TAKE 1 TABLET BY MOUTH EVERY DAY 02/23/23  Yes Levora Reyes SAUNDERS, MD  valACYclovir  (VALTREX ) 500 MG tablet TAKE 1 TABLET (500 MG TOTAL) BY MOUTH DAILY. INCREASE TO TWICE A DAY DOSING FOR 3 DAYS IF SYMPTOMS OF FLARE. 01/04/23  Yes Levora Reyes SAUNDERS, MD  benazepril  (LOTENSIN ) 20 MG tablet Take 20 mg by mouth daily.    [provider]  Doxylamine Succinate, Sleep, (SLEEP AID PO) Take by mouth daily. 2 tablets at bedtime.     [provider]   Social History   Socioeconomic History   Marital status: Divorced    Spouse name: Not on file   Number of children: 1   Years of education: 11   Highest education level: Not on file  Occupational History    Employer: [employer]  Tobacco Use   Smoking status: Every Day    Current packs/day: 0.50    Average packs/day: 0.5 packs/day for 50.0 years (25.0 ttl pk-yrs)    Types: Cigarettes   Smokeless tobacco: Never   Tobacco comments:    12 Cigarettes daily; last attempt at quitting 2005  Vaping Use   Vaping status: Never Used  Substance and Sexual Activity   Alcohol use: Yes    Comment: occas   Drug use: No   Sexual activity: Yes    Birth control/protection: Condom  Other Topics Concern   Not on file  Social History Narrative   Divorced father of one, grandfather 2.  Works for [employer] in [place]. Does not get routine activity/exercise -- Basically, he is not motivated to do any exercise. He, by himself, is admittedly lazy. Does not drink alcohol.   Social Drivers of Corporate investment banker Strain: Low Risk  (06/19/2023)   Overall Financial Resource Strain (CARDIA)    Difficulty of Paying Living Expenses: Not very hard  Food Insecurity: No Food Insecurity (06/19/2023)   Hunger Vital Sign    Worried About Running Out of Food in the Last Year: Never true    Ran Out of Food in the Last Year: Never true  Transportation Needs: No Transportation Needs (06/19/2023)   PRAPARE - Administrator, Civil Service (Medical): No    Lack of Transportation (Non-Medical): No  Physical Activity: Inactive (06/19/2023)   Exercise Vital Sign    Days of Exercise per Week: 0 days    Minutes of Exercise per Session: 0 min  Stress: No Stress Concern Present (06/19/2023)   [employer] of Occupational Health - Occupational Stress Questionnaire    Feeling of Stress: Not at all  Social Connections: Moderately Isolated  (06/19/2023)   Social Connection and Isolation Panel    Frequency of Communication with Friends and Family: Twice a week    Frequency of Social Gatherings with Friends and Family: Twice a week    Attends Religious Services: Never    Database administrator or Organizations: Yes    Attends Banker Meetings: 1 to 4 times per year    Marital Status: Divorced  Intimate Partner Violence: Not At Risk (06/19/2023)   Humiliation, Afraid, Rape, and Kick questionnaire    Fear of Current or Ex-Partner: No    Emotionally Abused: No    Physically Abused: No    Sexually Abused: No    Review of Systems Per HPI  Objective:   Vitals:   07/16/23 1512  BP: 124/78  Pulse: 80  Temp: 98 F (36.7 C)  SpO2: 98%  Weight: 194 lb 4 oz (88.1 kg)  Height: 5' 7 (1.702 m)     Physical Exam Vitals reviewed.  Constitutional:      Appearance: He is well-developed.  HENT:     Head: Normocephalic and atraumatic.     Right Ear: Tympanic membrane, ear canal and external ear normal.     Left Ear: Tympanic membrane, ear canal and external ear normal.  Neck:     Vascular: No carotid bruit or JVD.  Cardiovascular:     Rate and Rhythm: Normal rate and regular rhythm.     Heart sounds: Normal heart sounds. No murmur heard. Pulmonary:     Effort: Pulmonary effort is normal.     Breath sounds: Normal breath sounds. No rales.  Musculoskeletal:     Right lower leg: No edema.     Left lower leg: No edema.  Skin:    General: Skin is warm and dry.  Neurological:     Mental Status: He is alert and oriented to person, place, and time.  Psychiatric:        Mood and Affect: Mood normal.        Assessment & Plan:  [name] Benitez is a 67 y.o. male . Tinnitus, unspecified laterality - Plan: Ambulatory referral to Audiology Decreased hearing, unspecified laterality - Plan: Ambulatory referral to Audiology  - Suspect tinnitus may be related to his hearing loss.  Refer to audiology, handout  given.  CAD  - S/P urgent CABG x 2 for subtotal occlusion of the LAD in a stented segment that was unable to be recanalized percutaneously - Plan: metoprolol  succinate (TOPROL -XL) 50 MG 24 hr tablet, Lipid panel Essential hypertension - Plan: losartan  (COZAAR ) 25 MG tablet, metoprolol  succinate (TOPROL -XL) 50 MG 24 hr tablet, Comprehensive metabolic panel with GFR  - Check labs, continue same med regimen with close follow-up to ensure that his home meds match what we have.  Importance of ongoing follow-up discussed as well as follow-up with cardiology.  Heartburn - Plan: RABEprazole  (ACIPHEX ) 20 MG tablet Hiatal hernia - Plan: RABEprazole  (ACIPHEX ) 20 MG tablet  - Discussed hiatal hernia and reflux treatment, overall stable at this time, consider meeting with specialist if any worsening symptom control, and handout given.  Close follow-up to discuss further if needed.  Erectile dysfunction due to arterial insufficiency - Plan: sildenafil  (VIAGRA ) 100 MG tablet  - Will continue same meds for now, close follow-up to discuss other medications.  Prediabetes - Plan: Hemoglobin A1c  - Check updated labs and adjust plan accordingly.  Gastroesophageal reflux disease, unspecified whether esophagitis present - Plan: RABEprazole  (ACIPHEX ) 20 MG tablet  - As above  Nicotine use Commended on cutting back and quitting.  Handout given on managing the difficulties of quitting and advised to let us  know if we can help in this endeavor.  Meds ordered this encounter  Medications   losartan  (COZAAR ) 25 MG tablet    Sig: Take 1 tablet (25 mg total) by mouth daily.    Dispense:  60 tablet    Refill:  0   RABEprazole  (ACIPHEX ) 20 MG tablet    Sig: Take 1 tablet (20 mg total) by mouth daily.    Dispense:  90 tablet    Refill:  3   sildenafil  (VIAGRA ) 100 MG tablet    Sig: TAKE 0.5-1 TABLETS BY MOUTH DAILY AS NEEDED FOR ERECTILE DYSFUNCTION.    Dispense:  5 tablet    Refill:  11   metoprolol  succinate  (TOPROL -XL) 50 MG 24 hr tablet    Sig: TAKE 1 TABLET BY MOUTH EVERY DAY WITH OR IMMEDIATELY FOLLOWING A MEAL    Dispense:  90 tablet    Refill:  2   Patient Instructions  Great work on trying to quit smoking - we are proud of you!  See info below on ways to help.  See info below on hiatal hernia. If heartburn control worsens, we can refer you to specialist to discuss possible treatment. Will discuss further at next visit.  I will refer you to audiology for hearing testing as that is a common cause of ringing in the ears.  See information below. Bring your medications with you to your next visit, if any concerns on labs I will let you know, but we can discuss those at follow-up as well.  Thank you again for coming in today and please let me know if there are any questions or concerns.   Hiatal Hernia  A hiatal hernia occurs when part of the stomach slides above the muscle that separates the abdomen from the chest (diaphragm). A person can be born with a hiatal hernia (congenital), or it may develop over time. In  almost all cases of hiatal hernia, only the top part of the stomach pushes through the diaphragm. Many people have a hiatal hernia with no symptoms. The larger the hernia, the more likely it is that you will have symptoms. In some cases, a hiatal hernia allows stomach acid to flow back into the tube that carries food from your mouth to your stomach (esophagus). This may cause heartburn symptoms. The development of heartburn symptoms may mean that you have a condition called gastroesophageal reflux disease (GERD). What are the causes? This condition is caused by a weakness in the opening (hiatus) where the esophagus passes through the diaphragm to attach to the upper part of the stomach. A person may be born with a weakness in the hiatus, or a weakness can develop over time. What increases the risk? This condition is more likely to develop in: Older people. Age is a major risk factor for a  hiatal hernia, especially if you are over the age of 8. Pregnant women. People who are overweight. People who have frequent constipation. What are the signs or symptoms? Symptoms of this condition usually develop in the form of GERD symptoms. Symptoms include: Heartburn. Upset stomach (indigestion). Trouble swallowing. Coughing or wheezing. Wheezing is making high-pitched whistling sounds when you breathe. Sore throat. Chest pain. Nausea and vomiting. How is this diagnosed? This condition may be diagnosed during testing for GERD. Tests that may be done include: X-rays of your stomach or chest. An upper gastrointestinal (GI) series. This is an X-ray exam of your GI tract that is taken after you swallow a chalky liquid that shows up clearly on the X-ray. Endoscopy. This is a procedure to look into your stomach using a thin, flexible tube that has a tiny camera and light on the end of it. How is this treated? This condition may be treated by: Dietary and lifestyle changes to help reduce GERD symptoms. Medicines. These may include: Over-the-counter antacids. Medicines that make your stomach empty more quickly. Medicines that block the production of stomach acid (H2 blockers). Stronger medicines to reduce stomach acid (proton pump inhibitors). Surgery to repair the hernia, if other treatments are not helping. If you have no symptoms, you may not need treatment. Follow these instructions at home: Lifestyle and activity Do not use any products that contain nicotine or tobacco. These products include cigarettes, chewing tobacco, and vaping devices, such as e-cigarettes. If you need help quitting, ask your health care provider. Try to achieve and maintain a healthy body weight. Avoid putting pressure on your abdomen. Anything that puts pressure on your abdomen increases the amount of acid that may be pushed up into your esophagus. Avoid bending over, especially after eating. Raise the head  of your bed by putting blocks under the legs. This keeps your head and esophagus higher than your stomach. Do not wear tight clothing around your chest or stomach. Try not to strain when having a bowel movement, when urinating, or when lifting heavy objects. Eating and drinking Avoid foods that can worsen GERD symptoms. These may include: Fatty foods, like fried foods. Citrus fruits, like oranges or lemon. Other foods and drinks that contain acid, like orange juice or tomatoes. Spicy food. Chocolate. Eat frequent small meals instead of three large meals a day. This helps prevent your stomach from getting too full. Eat slowly. Do not lie down right after eating. Do not eat 1-2 hours before bed. Do not drink beverages with caffeine. These include cola, coffee, cocoa, and tea. Do  not drink alcohol. General instructions Take over-the-counter and prescription medicines only as told by your health care provider. Keep all follow-up visits. Your health care provider will want to check that any new prescribed medicines are helping your symptoms. Contact a health care provider if: Your symptoms are not controlled with medicines or lifestyle changes. You are having trouble swallowing. You have coughing or wheezing that will not go away. Your pain is getting worse. Your pain spreads to your arms, neck, jaw, teeth, or back. You feel nauseous or you vomit. Get help right away if: You have shortness of breath. You vomit blood. You have bright red blood in your stools. You have black, tarry stools. These symptoms may be an emergency. Get help right away. Call 911. Do not wait to see if the symptoms will go away. Do not drive yourself to the hospital. Summary A hiatal hernia occurs when part of the stomach slides above the muscle that separates the abdomen from the chest. A person may be born with a weakness in the hiatus, or a weakness can develop over time. Symptoms of a hiatal hernia may  include heartburn, trouble swallowing, or sore throat. Management of a hiatal hernia includes eating frequent small meals instead of three large meals a day. Get help right away if you vomit blood, have bright red blood in your stools, or have black, tarry stools. This information is not intended to replace advice given to you by your health care provider. Make sure you discuss any questions you have with your health care provider. Document Revised: 02/15/2021 Document Reviewed: 02/15/2021 Elsevier Patient Education  2024 Elsevier Inc.  Tinnitus Tinnitus is when you hear a sound that there's no actual source for. It may sound like ringing in your ears or something else. The sound may be loud, soft, or somewhere in between. It can last for a few seconds or be constant for days. It can come and go. Almost everyone has tinnitus at some point. It's not the same as hearing loss. But you may need to see a health care provider if: It lasts for a long time. It comes back often. You have trouble sleeping and focusing. What are the causes? The cause of tinnitus is often unknown. In some cases, you may get it if: You're around loud noises, such as from machines or music. An object gets stuck in your ear. Earwax builds up in Landscape architect. You drink a lot of alcohol or caffeine. You take certain medicines. You start to lose your hearing. You may also get it from some medical conditions. These may include: Ear or sinus infections. Heart diseases. High blood pressure. Allergies. Mnire's disease. Problems with your thyroid . A tumor. This is a growth of cells that isn't normal. A weak, bulging blood vessel called an aneurysm near your ear. What increases the risk? You may be more likely to get tinnitus if: You're around loud noises a lot. You're older. You drink alcohol. You smoke. What are the signs or symptoms? The main symptom is hearing a sound that there's no source for. It may sound like  ringing. It may also sound like: Buzzing. Sizzling. Blowing air. Hissing. Whistling. Other sounds may include: Roaring. Running water. A musical note. Tapping. Humming. You may have symptoms in one ear or both ears. How is this diagnosed? Tinnitus is diagnosed based on your symptoms, your medical history, and an exam. Your provider may do a full hearing test if your tinnitus: Is in just one ear.  Makes it hard for you to hear. Lasts 6 months or longer. You may also need to see an expert in hearing disorders called an audiologist. They may ask you about your symptoms and how tinnitus affects your daily life. You may have other tests done. These may include: A CT scan. An MRI. An angiogram. This shows how blood flows through your blood vessels. How is this treated? Treatment may include: Therapy to help you manage the stress of living with tinnitus. Finding ways to mask or cover the sound of tinnitus. These include: Sound or white noise machines. Devices that fit in your ear and play sounds or music. A loud humidifier. Acoustic neural stimulation. This is when you use headphones to listen to music that has a special signal in it. Over time, this signal may change some of the pathways in your brain. This can make you less sensitive to tinnitus. This treatment is used for very severe cases. Using hearing aids or cochlear implants if your tinnitus is from hearing loss. If your tinnitus is caused by a medical condition, treating the condition may make it go away.  Follow these instructions at home: Managing symptoms     Try to avoid being in loud places or around loud noises. Wear earplugs or headphones when you're around loud noises. Find ways to reduce stress. These may include meditation, yoga, or deep breathing. Sleep with your head slightly raised. General instructions Take over-the-counter and prescription medicines only as told by your provider. Track the things that cause  symptoms (triggers). Try to avoid these things. To stop your tinnitus from getting worse: Do not drink alcohol. Do not have caffeine. Do not use any products that contain nicotine or tobacco. These products include cigarettes, chewing tobacco, and vaping devices, such as e-cigarettes. If you need help quitting, ask your provider. Avoid using too much salt. Get enough sleep each night. Where to find more information American Tinnitus Association: https://www.johnson-hamilton.org/ Contact a health care provider if: Your symptoms last for 3 weeks or longer without stopping. You have sudden hearing loss. Your symptoms get worse or don't get better with home care. You can't manage the stress of living with tinnitus. Get help right away if: You get tinnitus after a head injury. You have tinnitus and: Dizziness. Nausea and vomiting. Loss of balance. A sudden, severe headache. Changes to your eyesight. Weakness in your face, arms, or legs. These symptoms may be an emergency. Get help right away. Call 911. Do not wait to see if the symptoms will go away. Do not drive yourself to the hospital. This information is not intended to replace advice given to you by your health care provider. Make sure you discuss any questions you have with your health care provider. Document Revised: 03/27/2022 Document Reviewed: 03/27/2022 Elsevier Patient Education  2024 Elsevier Inc.   Managing the Challenge of Quitting Smoking Quitting smoking is a physical and mental challenge. You may have cravings, withdrawal symptoms, and temptation to smoke. Before quitting, work with your health care provider to make a plan that can help you manage quitting. Making a plan before you quit may keep you from smoking when you have the urge to smoke while trying to quit. How to manage lifestyle changes Managing stress Stress can make you want to smoke, and wanting to smoke may cause stress. It is important to find ways to manage your stress. You  could try some of the following: Practice relaxation techniques. Breathe slowly and deeply, in through your  nose and out through your mouth. Listen to music. Soak in a bath or take a shower. Imagine a peaceful place or vacation. Get some support. Talk with family or friends about your stress. Join a support group. Talk with a counselor or therapist. Get some physical activity. Go for a walk, run, or bike ride. Play a favorite sport. Practice yoga.  Medicines Talk with your health care provider about medicines that might help you deal with cravings and make quitting easier for you. Relationships Social situations can be difficult when you are quitting smoking. To manage this, you can: Avoid parties and other social situations where people might be smoking. Avoid alcohol. Leave right away if you have the urge to smoke. Explain to your family and friends that you are quitting smoking. Ask for support and let them know you might be a bit grumpy. Plan activities where smoking is not an option. General instructions Be aware that many people gain weight after they quit smoking. However, not everyone does. To keep from gaining weight, have a plan in place before you quit, and stick to the plan after you quit. Your plan should include: Eating healthy snacks. When you have a craving, it may help to: Eat popcorn, or try carrots, celery, or other cut vegetables. Chew sugar-free gum. Changing how you eat. Eat small portion sizes at meals. Eat 4-6 small meals throughout the day instead of 1-2 large meals a day. Be mindful when you eat. You should avoid watching television or doing other things that might distract you as you eat. Exercising regularly. Make time to exercise each day. If you do not have time for a long workout, do short bouts of exercise for 5-10 minutes several times a day. Do some form of strengthening exercise, such as weight lifting. Do some exercise that gets your heart  beating and causes you to breathe deeply, such as walking fast, running, swimming, or biking. This is very important. Drinking plenty of water or other low-calorie or no-calorie drinks. Drink enough fluid to keep your urine pale yellow.  How to recognize withdrawal symptoms Your body and mind may experience discomfort as you try to get used to not having nicotine in your system. These effects are called withdrawal symptoms. They may include: Feeling hungrier than normal. Having trouble concentrating. Feeling irritable or restless. Having trouble sleeping. Feeling depressed. Craving a cigarette. These symptoms may surprise you, but they are normal to have when quitting smoking. To manage withdrawal symptoms: Avoid places, people, and activities that trigger your cravings. Remember why you want to quit. Get plenty of sleep. Avoid coffee and other drinks that contain caffeine. These may worsen some of your symptoms. How to manage cravings Come up with a plan for how to deal with your cravings. The plan should include the following: A definition of the specific situation you want to deal with. An activity or action you will take to replace smoking. A clear idea for how this action will help. The name of someone who could help you with this. Cravings usually last for 5-10 minutes. Consider taking the following actions to help you with your plan to deal with cravings: Keep your mouth busy. Chew sugar-free gum. Suck on hard candies or a straw. Brush your teeth. Keep your hands and body busy. Change to a different activity right away. Squeeze or play with a ball. Do an activity or a hobby, such as making bead jewelry, practicing needlepoint, or working with wood. Mix up your normal  routine. Take a short exercise break. Go for a quick walk, or run up and down stairs. Focus on doing something kind or helpful for someone else. Call a friend or family member to talk during a craving. Join a  support group. Contact a quitline. Where to find support To get help or find a support group: Call the National Cancer Institute's Smoking Quitline: 1-800-QUIT-NOW (551) 305-3252) Text QUIT to SmokefreeTXT: 521151 Where to find more information Visit these websites to find more information on quitting smoking: U.S. Department of Health and Human Services: www.smokefree.gov American Lung Association: www.freedomfromsmoking.org Centers for Disease Control and Prevention (CDC): FootballExhibition.com.br American Heart Association: www.heart.org Contact a health care provider if: You want to change your plan for quitting. The medicines you are taking are not helping. Your eating feels out of control or you cannot sleep. You feel depressed or become very anxious. Summary Quitting smoking is a physical and mental challenge. You will face cravings, withdrawal symptoms, and temptation to smoke again. Preparation can help you as you go through these challenges. Try different techniques to manage stress, handle social situations, and prevent weight gain. You can deal with cravings by keeping your mouth busy (such as by chewing gum), keeping your hands and body busy, calling family or friends, or contacting a quitline for people who want to quit smoking. You can deal with withdrawal symptoms by avoiding places where people smoke, getting plenty of rest, and avoiding drinks that contain caffeine. This information is not intended to replace advice given to you by your health care provider. Make sure you discuss any questions you have with your health care provider. Document Revised: 12/10/2020 Document Reviewed: 12/10/2020 Elsevier Patient Education  2024 Elsevier Inc.    Signed,   Reyes Pines, MD [facility], [facility] 07/16/23 3:55 PM

## 2023-07-17 ENCOUNTER — Encounter: Payer: Self-pay | Admitting: Family Medicine

## 2023-07-17 LAB — COMPREHENSIVE METABOLIC PANEL WITH GFR
ALT: 23 U/L (ref 0–53)
AST: 21 U/L (ref 0–37)
Albumin: 4.7 g/dL (ref 3.5–5.2)
Alkaline Phosphatase: 60 U/L (ref 39–117)
BUN: 16 mg/dL (ref 6–23)
CO2: 25 meq/L (ref 19–32)
Calcium: 9.2 mg/dL (ref 8.4–10.5)
Chloride: 105 meq/L (ref 96–112)
Creatinine, Ser: 0.91 mg/dL (ref 0.40–1.50)
GFR: 86.59 mL/min (ref >60.00)
Glucose, Bld: 79 mg/dL (ref 70–99)
Potassium: 4.6 meq/L (ref 3.5–5.1)
Sodium: 136 meq/L (ref 135–145)
Total Bilirubin: 0.7 mg/dL (ref 0.2–1.2)
Total Protein: 7.5 g/dL (ref 6.0–8.3)

## 2023-07-17 LAB — HEMOGLOBIN A1C: Hgb A1c MFr Bld: 6.2 % (ref 4.6–6.5)

## 2023-07-17 LAB — LIPID PANEL
Cholesterol: 135 mg/dL (ref 0–200)
HDL: 38 mg/dL — ABNORMAL LOW (ref >39.00)
LDL Cholesterol: 33 mg/dL (ref 0–99)
NonHDL: 96.98
Total CHOL/HDL Ratio: 4
Triglycerides: 319 mg/dL — ABNORMAL HIGH (ref 0.0–149.0)
VLDL: 63.8 mg/dL — ABNORMAL HIGH (ref 0.0–40.0)

## 2023-07-18 ENCOUNTER — Ambulatory Visit: Payer: Self-pay | Admitting: Family Medicine

## 2023-07-24 ENCOUNTER — Encounter (INDEPENDENT_AMBULATORY_CARE_PROVIDER_SITE_OTHER): Payer: Self-pay

## 2023-08-22 ENCOUNTER — Ambulatory Visit: Admitting: Family Medicine

## 2023-08-22 VITALS — BP 100/70 | HR 73 | Temp 98.2°F | Resp 16 | Ht 67.0 in | Wt 185.4 lb

## 2023-08-22 DIAGNOSIS — R059 Cough, unspecified: Secondary | ICD-10-CM

## 2023-08-22 DIAGNOSIS — K449 Diaphragmatic hernia without obstruction or gangrene: Secondary | ICD-10-CM | POA: Diagnosis not present

## 2023-08-22 DIAGNOSIS — R7303 Prediabetes: Secondary | ICD-10-CM

## 2023-08-22 NOTE — Progress Notes (Signed)
 Subjective:  Patient ID: [name] Benitez, male    DOB: June 06, 1955  Age: 68 y.o. MRN: [id number]  CC:  Chief Complaint  Patient presents with   Follow-up    Discuss labs and discuss hiatal hernia.     HPI [name] Benitez presents for  Follow-up from July 14 visit, multiple concerns discussed at that time.  Hiatal hernia Reflux controlled with Aciphex  discussed at his July 14 visit, stable at that time.  Did discuss follow-up with surgeon if any worsening symptom control to discuss hiatal hernia and treatment options. Still having good control of heartburn. Every once in awhile depending on what he eats or eating too late - takes extra aciphex  Only a few times per year.   Prediabetes: Overall stable A1c on most recent labs July 14, continue diet/exercise approach. Lab Results  Component Value Date   HGBA1C 6.2 07/16/2023   Wt Readings from Last 3 Encounters:  08/22/23 185 lb 6.4 oz (84.1 kg)  07/16/23 194 lb 4 oz (88.1 kg)  06/27/23 169 lb (76.7 kg)    Cough, congestion: Started few weeks ago, 8/1.  Home covid test was negative. Better now, still some cough. Slight cough every once in awhile, feels in throat. Heartburn well controlled.  Cough has gotten better. No dyspnea currently - some initially, better now.     History Patient Active Problem List   Diagnosis Date Noted   Encounter for Department of Transportation (DOT) examination for driving license renewal 98/89/7979   Nocturia 05/08/2013   Atherosclerosis of native coronary artery without angina pectoris    Essential hypertension    Dyslipidemia, at goal LDL below 70    Erectile dysfunction    CAD  - S/P urgent CABG x 2 for subtotal occlusion of the LAD in a stented segment that was unable to be recanalized percutaneously 04/02/2009   Past Medical History:  Diagnosis Date   CAD in native artery 10/2003   Essentially LAD occlusion with in-stent restenosis/thrombosis.  50% PDA--referred for CABG x2.    Coronary stent restenosis due to progression of disease 04/2009   Subtotal occlusion of the LAD with ISR, unable to cross with wire; aneurysmal dilation of the LAD prior to stent   Current occasional smoker    He initially quit around the time of his CABG, but he has gotten back to smoking maybe 1 or 2 a day as a stress relief.  Does not smoke every day.   Dyslipidemia, goal LDL below 70    Erectile dysfunction    History of GI bleed    No recurrence   History of vertigo    Hypertension    S/P CABG x 2 04/2009   LIMA-LAD, SVG-RPDA (to bypass a roughly 50% lesion); post CABG stress normal with no ischemia   ST elevation (STEMI) myocardial infarction involving left anterior descending coronary artery (HCC) 10/2003   LAD PCI with 2 overlapping Cypher DES 3.0 mm 23 mm   Past Surgical History:  Procedure Laterality Date   CARDIAC CATHETERIZATION  04/28/2009   Subtotal occlusion of mid LAD with ISR/thrombosis, unable to pass --> urgent CABG (Dr. DOROTHA Gouty)   cataract surgery  02/2022   COLONOSCOPY  2005   CORONARY ANGIOPLASTY WITH STENT PLACEMENT  10/26/2003   anterior ischemia with significant reversibility (mid anterior and anteroseptal, apical anterior and apical anteroseptal)  - Cypher 3x78mm stent to mid LAD and Cypher 3x47mm Cypher DES to prox LAD(Dr. FABIENE Hasten)   CORONARY ARTERY BYPASS  GRAFT  04/2009   LIMA-LAD, reverse SVG-distal RCA (Dr. GORMAN. Hendrickson)   DOPPLER ECHOCARDIOGRAPHY  04/2009   EF 50-50%, borderline septal hypokinesis   Exercise Tolerance Test  06/21/2013   Exercise 8:05 Min, 10.1 METs, reached 99% max. Peak HR -- 160 bpm; NEGATIVE Bruce TM GXT = no EKG evidence of ischemia. Prolonged heart rate and blood pressure recovery and PVCs noted that resolve with Exerction.   NM MYOVIEW  LTD  05/2018   EF 45%.  Low risk.  No ischemia or infarction.   Allergies  Allergen Reactions   Ace Inhibitors Other (See Comments)    Angioedema 04/05/22.    Prior to Admission  medications   Medication Sig Start Date End Date Taking? Authorizing Provider  acetaminophen (TYLENOL) 500 MG tablet Take 500 mg by mouth 2 (two) times daily.   Yes [provider]  aspirin EC 81 MG tablet Take 81 mg by mouth daily.   Yes [provider]  Doxylamine Succinate, Sleep, (SLEEP AID PO) Take by mouth daily. 2 tablets at bedtime.   Yes [provider]  fish oil-omega-3 fatty acids 1000 MG capsule Take 1 g by mouth 2 (two) times daily.    Yes [provider]  losartan  (COZAAR ) 25 MG tablet Take 1 tablet (25 mg total) by mouth daily. 07/16/23  Yes Levora Reyes SAUNDERS, MD  metoprolol  succinate (TOPROL -XL) 50 MG 24 hr tablet TAKE 1 TABLET BY MOUTH EVERY DAY WITH OR IMMEDIATELY FOLLOWING A MEAL 07/16/23  Yes Levora Reyes SAUNDERS, MD  RABEprazole  (ACIPHEX ) 20 MG tablet Take 1 tablet (20 mg total) by mouth daily. 07/16/23  Yes Levora Reyes SAUNDERS, MD  sildenafil  (VIAGRA ) 100 MG tablet TAKE 0.5-1 TABLETS BY MOUTH DAILY AS NEEDED FOR ERECTILE DYSFUNCTION. 07/16/23  Yes Levora Reyes SAUNDERS, MD  simvastatin  (ZOCOR ) 40 MG tablet TAKE 1 TABLET BY MOUTH EVERY DAY 02/23/23  Yes Levora Reyes SAUNDERS, MD  valACYclovir  (VALTREX ) 500 MG tablet TAKE 1 TABLET (500 MG TOTAL) BY MOUTH DAILY. INCREASE TO TWICE A DAY DOSING FOR 3 DAYS IF SYMPTOMS OF FLARE. 01/04/23  Yes Levora Reyes SAUNDERS, MD   Social History   Socioeconomic History   Marital status: Divorced    Spouse name: Not on file   Number of children: 1   Years of education: 11   Highest education level: Not on file  Occupational History    Employer: [employer]  Tobacco Use   Smoking status: Every Day    Current packs/day: 0.50    Average packs/day: 0.5 packs/day for 50.0 years (25.0 ttl pk-yrs)    Types: Cigarettes   Smokeless tobacco: Never   Tobacco comments:    12 Cigarettes daily; last attempt at quitting 2005  Vaping Use   Vaping status: Never Used  Substance and Sexual Activity   Alcohol use: Yes     Comment: occas   Drug use: No   Sexual activity: Yes    Birth control/protection: Condom  Other Topics Concern   Not on file  Social History Narrative   Divorced father of one, grandfather 2.  Works for [employer] in [place]. Does not get routine activity/exercise -- Basically, he is not motivated to do any exercise. He, by himself, is admittedly lazy. Does not drink alcohol.   Social Drivers of Corporate investment banker Strain: Low Risk  (06/19/2023)   Overall Financial Resource Strain (CARDIA)    Difficulty of Paying Living Expenses: Not very hard  Food Insecurity: No  Food Insecurity (06/19/2023)   Hunger Vital Sign    Worried About Running Out of Food in the Last Year: Never true    Ran Out of Food in the Last Year: Never true  Transportation Needs: No Transportation Needs (06/19/2023)   PRAPARE - Administrator, Civil Service (Medical): No    Lack of Transportation (Non-Medical): No  Physical Activity: Inactive (06/19/2023)   Exercise Vital Sign    Days of Exercise per Week: 0 days    Minutes of Exercise per Session: 0 min  Stress: No Stress Concern Present (06/19/2023)   [name] Benitez of Occupational Health - Occupational Stress Questionnaire    Feeling of Stress: Not at all  Social Connections: Moderately Isolated (06/19/2023)   Social Connection and Isolation Panel    Frequency of Communication with Friends and Family: Twice a week    Frequency of Social Gatherings with Friends and Family: Twice a week    Attends Religious Services: Never    Database administrator or Organizations: Yes    Attends Banker Meetings: 1 to 4 times per year    Marital Status: Divorced  Intimate Partner Violence: Not At Risk (06/19/2023)   Humiliation, Afraid, Rape, and Kick questionnaire    Fear of Current or Ex-Partner: No    Emotionally Abused: No    Physically Abused: No    Sexually Abused: No    Review of Systems Per HPI.   Objective:    Vitals:   08/22/23 1612  BP: 100/70  Pulse: 73  Resp: 16  Temp: 98.2 F (36.8 C)  TempSrc: Temporal  SpO2: 97%  Weight: 185 lb 6.4 oz (84.1 kg)  Height: 5' 7 (1.702 m)     Physical Exam Vitals reviewed.  Constitutional:      Appearance: He is well-developed.  HENT:     Head: Normocephalic and atraumatic.  Neck:     Vascular: No carotid bruit or JVD.  Cardiovascular:     Rate and Rhythm: Normal rate and regular rhythm.     Heart sounds: Normal heart sounds. No murmur heard. Pulmonary:     Effort: Pulmonary effort is normal.     Breath sounds: Normal breath sounds. No rales.  Musculoskeletal:     Right lower leg: No edema.     Left lower leg: No edema.  Skin:    General: Skin is warm and dry.  Neurological:     Mental Status: He is alert and oriented to person, place, and time.  Psychiatric:        Mood and Affect: Mood normal.        Assessment & Plan:  [name] Benitez is a 68 y.o. male . Cough, unspecified type  - Suspected viral illness with improving symptoms, few weeks out from infection, lungs clear on exam.  Continue symptomatic care with RTC precautions if cough does not resolve.  If cough does not completely resolve in the next 1 to 2 weeks, can order chest x-ray.  Hiatal hernia  - Discussed again in office, handout given again on condition.  Heartburn symptoms have been well-controlled.  I did WORK for him to meet with general surgery or gastroenterology to discuss hiatal hernia, deferred for now unless symptoms worsen.  Prediabetes Discussed labs, diet/exercise approach with recheck levels in 3 months.  Overall stable.   Patient Instructions  Thank you for coming in today.  Your lungs  are clear at this time, but if the cough does not  resolve in the next 2 weeks, please let me know and I will order a chest x-ray.  If any worsening symptoms in meantime including worsening cough, shortness of breath, fevers or other worsening symptoms please be  seen.  See information below on hiatal hernia.  As long as your symptoms are stable with treatment of heartburn, no med changes for now but I am happy to refer you to either a gastroenterologist to discuss this further or a surgeon to discuss treatment options for hiatal hernia and see if they would recommend that for you.  Again let me know and I am happy to place that referral.  Overall labs looked okay at your last visit, we will follow-up in 3 months and likely will talk about repeat labs at that time but I am happy to see you sooner if needed.  Please let me know if there are any questions from today's visit and take care!   Hiatal Hernia  A hiatal hernia occurs when part of the stomach slides above the muscle that separates the abdomen from the chest (diaphragm). A person can be born with a hiatal hernia (congenital), or it may develop over time. In almost all cases of hiatal hernia, only the top part of the stomach pushes through the diaphragm. Many people have a hiatal hernia with no symptoms. The larger the hernia, the more likely it is that you will have symptoms. In some cases, a hiatal hernia allows stomach acid to flow back into the tube that carries food from your mouth to your stomach (esophagus). This may cause heartburn symptoms. The development of heartburn symptoms may mean that you have a condition called gastroesophageal reflux disease (GERD). What are the causes? This condition is caused by a weakness in the opening (hiatus) where the esophagus passes through the diaphragm to attach to the upper part of the stomach. A person may be born with a weakness in the hiatus, or a weakness can develop over time. What increases the risk? This condition is more likely to develop in: Older people. Age is a major risk factor for a hiatal hernia, especially if you are over the age of 39. Pregnant women. People who are overweight. People who have frequent constipation. What are the signs or  symptoms? Symptoms of this condition usually develop in the form of GERD symptoms. Symptoms include: Heartburn. Upset stomach (indigestion). Trouble swallowing. Coughing or wheezing. Wheezing is making high-pitched whistling sounds when you breathe. Sore throat. Chest pain. Nausea and vomiting. How is this diagnosed? This condition may be diagnosed during testing for GERD. Tests that may be done include: X-rays of your stomach or chest. An upper gastrointestinal (GI) series. This is an X-ray exam of your GI tract that is taken after you swallow a chalky liquid that shows up clearly on the X-ray. Endoscopy. This is a procedure to look into your stomach using a thin, flexible tube that has a tiny camera and light on the end of it. How is this treated? This condition may be treated by: Dietary and lifestyle changes to help reduce GERD symptoms. Medicines. These may include: Over-the-counter antacids. Medicines that make your stomach empty more quickly. Medicines that block the production of stomach acid (H2 blockers). Stronger medicines to reduce stomach acid (proton pump inhibitors). Surgery to repair the hernia, if other treatments are not helping. If you have no symptoms, you may not need treatment. Follow these instructions at home: Lifestyle and activity Do not use any products that contain  nicotine or tobacco. These products include cigarettes, chewing tobacco, and vaping devices, such as e-cigarettes. If you need help quitting, ask your health care provider. Try to achieve and maintain a healthy body weight. Avoid putting pressure on your abdomen. Anything that puts pressure on your abdomen increases the amount of acid that may be pushed up into your esophagus. Avoid bending over, especially after eating. Raise the head of your bed by putting blocks under the legs. This keeps your head and esophagus higher than your stomach. Do not wear tight clothing around your chest or  stomach. Try not to strain when having a bowel movement, when urinating, or when lifting heavy objects. Eating and drinking Avoid foods that can worsen GERD symptoms. These may include: Fatty foods, like fried foods. Citrus fruits, like oranges or lemon. Other foods and drinks that contain acid, like orange juice or tomatoes. Spicy food. Chocolate. Eat frequent small meals instead of three large meals a day. This helps prevent your stomach from getting too full. Eat slowly. Do not lie down right after eating. Do not eat 1-2 hours before bed. Do not drink beverages with caffeine. These include cola, coffee, cocoa, and tea. Do not drink alcohol. General instructions Take over-the-counter and prescription medicines only as told by your health care provider. Keep all follow-up visits. Your health care provider will want to check that any new prescribed medicines are helping your symptoms. Contact a health care provider if: Your symptoms are not controlled with medicines or lifestyle changes. You are having trouble swallowing. You have coughing or wheezing that will not go away. Your pain is getting worse. Your pain spreads to your arms, neck, jaw, teeth, or back. You feel nauseous or you vomit. Get help right away if: You have shortness of breath. You vomit blood. You have bright red blood in your stools. You have black, tarry stools. These symptoms may be an emergency. Get help right away. Call 911. Do not wait to see if the symptoms will go away. Do not drive yourself to the hospital. Summary A hiatal hernia occurs when part of the stomach slides above the muscle that separates the abdomen from the chest. A person may be born with a weakness in the hiatus, or a weakness can develop over time. Symptoms of a hiatal hernia may include heartburn, trouble swallowing, or sore throat. Management of a hiatal hernia includes eating frequent small meals instead of three large meals a  day. Get help right away if you vomit blood, have bright red blood in your stools, or have black, tarry stools. This information is not intended to replace advice given to you by your health care provider. Make sure you discuss any questions you have with your health care provider. Document Revised: 02/15/2021 Document Reviewed: 02/15/2021 Elsevier Patient Education  2024 Elsevier Inc.Cough, Adult Coughing is a reflex that clears your throat and airways (respiratory system). It helps heal and protect your lungs. It is normal to cough from time to time. A cough that happens with other symptoms or that lasts a long time may be a sign of a condition that needs treatment. A short-term (acute) cough may only last 2-3 weeks. A long-term (chronic) cough may last 8 or more weeks. Coughing is often caused by: Diseases, such as: An infection of the respiratory system. Asthma or other heart or lung diseases. Gastroesophageal reflux. This is when acid comes back up from the stomach. Breathing in things that irritate your lungs. Allergies. Postnasal drip. This is  when mucus runs down the back of your throat. Smoking. Some medicines. Follow these instructions at home: Medicines Take over-the-counter and prescription medicines only as told by your health care provider. Talk with your provider before you take cough medicine (cough suppressants). Eating and drinking Do not drink alcohol. Avoid caffeine. Drink enough fluid to keep your pee (urine) pale yellow. Lifestyle Avoid cigarette smoke. Do not use any products that contain nicotine or tobacco. These products include cigarettes, chewing tobacco, and vaping devices, such as e-cigarettes. If you need help quitting, ask your provider. Avoid things that make you cough. These may include perfumes, candles, cleaning products, or campfire smoke. General instructions  Watch for any changes to your cough. Tell your provider about them. Always cover your  mouth when you cough. If the air is dry in your bedroom or home, use a cool mist vaporizer or humidifier. If your cough is worse at night, try to sleep in a semi-upright position. Rest as needed. Contact a health care provider if: You have new symptoms, or your symptoms get worse. You cough up pus. You have a fever that does not go away or a cough that does not get better after 2-3 weeks. You cannot control your cough with medicine, and you are losing sleep. You have pain that gets worse or is not helped with medicine. You lose weight for no clear reason. You have night sweats. Get help right away if: You cough up blood. You have trouble breathing. Your heart is beating very fast. These symptoms may be an emergency. Get help right away. Call 911. Do not wait to see if the symptoms will go away. Do not drive yourself to the hospital. This information is not intended to replace advice given to you by your health care provider. Make sure you discuss any questions you have with your health care provider. Document Revised: 08/19/2021 Document Reviewed: 08/19/2021 Elsevier Patient Education  2024 Elsevier Inc.    Signed,   Reyes Pines, MD [facility], [facility] 08/22/23 4:31 PM

## 2023-08-22 NOTE — Patient Instructions (Addendum)
 Thank you for coming in today.  Your lungs  are clear at this time, but if the cough does not resolve in the next 2 weeks, please let me know and I will order a chest x-ray.  If any worsening symptoms in meantime including worsening cough, shortness of breath, fevers or other worsening symptoms please be seen.  See information below on hiatal hernia.  As long as your symptoms are stable with treatment of heartburn, no med changes for now but I am happy to refer you to either a gastroenterologist to discuss this further or a surgeon to discuss treatment options for hiatal hernia and see if they would recommend that for you.  Again let me know and I am happy to place that referral.  Overall labs looked okay at your last visit, we will follow-up in 3 months and likely will talk about repeat labs at that time but I am happy to see you sooner if needed.  Please let me know if there are any questions from today's visit and take care!   Hiatal Hernia  A hiatal hernia occurs when part of the stomach slides above the muscle that separates the abdomen from the chest (diaphragm). A person can be born with a hiatal hernia (congenital), or it may develop over time. In almost all cases of hiatal hernia, only the top part of the stomach pushes through the diaphragm. Many people have a hiatal hernia with no symptoms. The larger the hernia, the more likely it is that you will have symptoms. In some cases, a hiatal hernia allows stomach acid to flow back into the tube that carries food from your mouth to your stomach (esophagus). This may cause heartburn symptoms. The development of heartburn symptoms may mean that you have a condition called gastroesophageal reflux disease (GERD). What are the causes? This condition is caused by a weakness in the opening (hiatus) where the esophagus passes through the diaphragm to attach to the upper part of the stomach. A person may be born with a weakness in the hiatus, or a weakness  can develop over time. What increases the risk? This condition is more likely to develop in: Older people. Age is a major risk factor for a hiatal hernia, especially if you are over the age of 64. Pregnant women. People who are overweight. People who have frequent constipation. What are the signs or symptoms? Symptoms of this condition usually develop in the form of GERD symptoms. Symptoms include: Heartburn. Upset stomach (indigestion). Trouble swallowing. Coughing or wheezing. Wheezing is making high-pitched whistling sounds when you breathe. Sore throat. Chest pain. Nausea and vomiting. How is this diagnosed? This condition may be diagnosed during testing for GERD. Tests that may be done include: X-rays of your stomach or chest. An upper gastrointestinal (GI) series. This is an X-ray exam of your GI tract that is taken after you swallow a chalky liquid that shows up clearly on the X-ray. Endoscopy. This is a procedure to look into your stomach using a thin, flexible tube that has a tiny camera and light on the end of it. How is this treated? This condition may be treated by: Dietary and lifestyle changes to help reduce GERD symptoms. Medicines. These may include: Over-the-counter antacids. Medicines that make your stomach empty more quickly. Medicines that block the production of stomach acid (H2 blockers). Stronger medicines to reduce stomach acid (proton pump inhibitors). Surgery to repair the hernia, if other treatments are not helping. If you have no symptoms,  you may not need treatment. Follow these instructions at home: Lifestyle and activity Do not use any products that contain nicotine or tobacco. These products include cigarettes, chewing tobacco, and vaping devices, such as e-cigarettes. If you need help quitting, ask your health care provider. Try to achieve and maintain a healthy body weight. Avoid putting pressure on your abdomen. Anything that puts pressure on your  abdomen increases the amount of acid that may be pushed up into your esophagus. Avoid bending over, especially after eating. Raise the head of your bed by putting blocks under the legs. This keeps your head and esophagus higher than your stomach. Do not wear tight clothing around your chest or stomach. Try not to strain when having a bowel movement, when urinating, or when lifting heavy objects. Eating and drinking Avoid foods that can worsen GERD symptoms. These may include: Fatty foods, like fried foods. Citrus fruits, like oranges or lemon. Other foods and drinks that contain acid, like orange juice or tomatoes. Spicy food. Chocolate. Eat frequent small meals instead of three large meals a day. This helps prevent your stomach from getting too full. Eat slowly. Do not lie down right after eating. Do not eat 1-2 hours before bed. Do not drink beverages with caffeine. These include cola, coffee, cocoa, and tea. Do not drink alcohol. General instructions Take over-the-counter and prescription medicines only as told by your health care provider. Keep all follow-up visits. Your health care provider will want to check that any new prescribed medicines are helping your symptoms. Contact a health care provider if: Your symptoms are not controlled with medicines or lifestyle changes. You are having trouble swallowing. You have coughing or wheezing that will not go away. Your pain is getting worse. Your pain spreads to your arms, neck, jaw, teeth, or back. You feel nauseous or you vomit. Get help right away if: You have shortness of breath. You vomit blood. You have bright red blood in your stools. You have black, tarry stools. These symptoms may be an emergency. Get help right away. Call 911. Do not wait to see if the symptoms will go away. Do not drive yourself to the hospital. Summary A hiatal hernia occurs when part of the stomach slides above the muscle that separates the abdomen  from the chest. A person may be born with a weakness in the hiatus, or a weakness can develop over time. Symptoms of a hiatal hernia may include heartburn, trouble swallowing, or sore throat. Management of a hiatal hernia includes eating frequent small meals instead of three large meals a day. Get help right away if you vomit blood, have bright red blood in your stools, or have black, tarry stools. This information is not intended to replace advice given to you by your health care provider. Make sure you discuss any questions you have with your health care provider. Document Revised: 02/15/2021 Document Reviewed: 02/15/2021 [employer] Patient Education  2024 [employer]Cough, Adult Coughing is a reflex that clears your throat and airways (respiratory system). It helps heal and protect your lungs. It is normal to cough from time to time. A cough that happens with other symptoms or that lasts a long time may be a sign of a condition that needs treatment. A short-term (acute) cough may only last 2-3 weeks. A long-term (chronic) cough may last 8 or more weeks. Coughing is often caused by: Diseases, such as: An infection of the respiratory system. Asthma or other heart or lung diseases. Gastroesophageal reflux. This is  when acid comes back up from the stomach. Breathing in things that irritate your lungs. Allergies. Postnasal drip. This is when mucus runs down the back of your throat. Smoking. Some medicines. Follow these instructions at home: Medicines Take over-the-counter and prescription medicines only as told by your health care provider. Talk with your provider before you take cough medicine (cough suppressants). Eating and drinking Do not drink alcohol. Avoid caffeine. Drink enough fluid to keep your pee (urine) pale yellow. Lifestyle Avoid cigarette smoke. Do not use any products that contain nicotine or tobacco. These products include cigarettes, chewing tobacco, and vaping devices,  such as e-cigarettes. If you need help quitting, ask your provider. Avoid things that make you cough. These may include perfumes, candles, cleaning products, or campfire smoke. General instructions  Watch for any changes to your cough. Tell your provider about them. Always cover your mouth when you cough. If the air is dry in your bedroom or home, use a cool mist vaporizer or humidifier. If your cough is worse at night, try to sleep in a semi-upright position. Rest as needed. Contact a health care provider if: You have new symptoms, or your symptoms get worse. You cough up pus. You have a fever that does not go away or a cough that does not get better after 2-3 weeks. You cannot control your cough with medicine, and you are losing sleep. You have pain that gets worse or is not helped with medicine. You lose weight for no clear reason. You have night sweats. Get help right away if: You cough up blood. You have trouble breathing. Your heart is beating very fast. These symptoms may be an emergency. Get help right away. Call 911. Do not wait to see if the symptoms will go away. Do not drive yourself to the hospital. This information is not intended to replace advice given to you by your health care provider. Make sure you discuss any questions you have with your health care provider. Document Revised: 08/19/2021 Document Reviewed: 08/19/2021 [employer] Patient Education  2024 [employer].

## 2023-08-26 ENCOUNTER — Encounter: Payer: Self-pay | Admitting: Family Medicine

## 2023-09-12 ENCOUNTER — Other Ambulatory Visit: Payer: Self-pay | Admitting: Family Medicine

## 2023-09-12 DIAGNOSIS — I10 Essential (primary) hypertension: Secondary | ICD-10-CM

## 2023-11-07 ENCOUNTER — Other Ambulatory Visit: Payer: Self-pay | Admitting: Family Medicine

## 2023-11-07 DIAGNOSIS — I10 Essential (primary) hypertension: Secondary | ICD-10-CM

## 2023-11-08 ENCOUNTER — Other Ambulatory Visit: Payer: Self-pay | Admitting: Family Medicine

## 2023-11-08 DIAGNOSIS — A6000 Herpesviral infection of urogenital system, unspecified: Secondary | ICD-10-CM

## 2023-11-22 ENCOUNTER — Ambulatory Visit (INDEPENDENT_AMBULATORY_CARE_PROVIDER_SITE_OTHER): Admitting: Family Medicine

## 2023-11-22 ENCOUNTER — Encounter: Payer: Self-pay | Admitting: Family Medicine

## 2023-11-22 VITALS — BP 116/68 | HR 87 | Temp 98.2°F | Resp 20 | Ht 67.0 in | Wt 195.4 lb

## 2023-11-22 DIAGNOSIS — I2510 Atherosclerotic heart disease of native coronary artery without angina pectoris: Secondary | ICD-10-CM

## 2023-11-22 DIAGNOSIS — K449 Diaphragmatic hernia without obstruction or gangrene: Secondary | ICD-10-CM

## 2023-11-22 DIAGNOSIS — K219 Gastro-esophageal reflux disease without esophagitis: Secondary | ICD-10-CM | POA: Diagnosis not present

## 2023-11-22 DIAGNOSIS — Z951 Presence of aortocoronary bypass graft: Secondary | ICD-10-CM | POA: Diagnosis not present

## 2023-11-22 DIAGNOSIS — L853 Xerosis cutis: Secondary | ICD-10-CM

## 2023-11-22 DIAGNOSIS — Z87891 Personal history of nicotine dependence: Secondary | ICD-10-CM | POA: Diagnosis not present

## 2023-11-22 DIAGNOSIS — I10 Essential (primary) hypertension: Secondary | ICD-10-CM

## 2023-11-22 DIAGNOSIS — R7303 Prediabetes: Secondary | ICD-10-CM

## 2023-11-22 DIAGNOSIS — R21 Rash and other nonspecific skin eruption: Secondary | ICD-10-CM | POA: Diagnosis not present

## 2023-11-22 DIAGNOSIS — Z79899 Other long term (current) drug therapy: Secondary | ICD-10-CM

## 2023-11-22 DIAGNOSIS — E785 Hyperlipidemia, unspecified: Secondary | ICD-10-CM | POA: Diagnosis not present

## 2023-11-22 DIAGNOSIS — Z122 Encounter for screening for malignant neoplasm of respiratory organs: Secondary | ICD-10-CM

## 2023-11-22 MED ORDER — METOPROLOL SUCCINATE ER 50 MG PO TB24: ORAL_TABLET | ORAL | 2 refills | Status: AC

## 2023-11-22 MED ORDER — SIMVASTATIN 40 MG PO TABS: 40.0000 mg | ORAL_TABLET | Freq: Every day | ORAL | 3 refills | Status: AC

## 2023-11-22 MED ORDER — CLOTRIMAZOLE-BETAMETHASONE 1-0.05 % EX CREA: 1.0000 | TOPICAL_CREAM | Freq: Every day | CUTANEOUS | 0 refills | Status: AC

## 2023-11-22 MED ORDER — LOSARTAN POTASSIUM 25 MG PO TABS: 25.0000 mg | ORAL_TABLET | Freq: Every day | ORAL | 2 refills | Status: AC

## 2023-11-22 NOTE — Progress Notes (Signed)
 Subjective:  Patient ID: [name] Benitez, male    DOB: Mar 14, 1955  Age: 68 y.o. MRN: [id number]  CC:  Chief Complaint  Patient presents with   Hypertension   Follow-up    No questions or concerns.     HPI [name] Benitez presents for routine follow up.   Acute concern: Itchy spots/rash.  Past 2 months. Initially under left arm, then right arm. Few on legs.  Tx: cortisone - daily - helps the itch.  No genital or oral lesions.   Hypertension: Losartan  25 mg daily, Toprol -XL 50 mg daily.  History of CAD, status post CABG, followed by cardiology, Dr. Anner.  Surveillance Myoview  stress test on June 25.  Low risk with normal EF.  Hyperdynamic with EF 69%.  No evidence of infarction or ischemia.  Increased ejection fraction compared to previous study in 2020. Home readings -none.  Denies lightheadedness/dizziness or fatigue.   BP Readings from Last 3 Encounters:  11/22/23 116/68  08/22/23 100/70  07/16/23 124/78   Lab Results  Component Value Date   CREATININE 0.91 07/16/2023   Hyperlipidemia: Simvastatin  40 mg daily, no myalgias/side effects.  Lab Results  Component Value Date   CHOL 135 07/16/2023   HDL 38.00 (L) 07/16/2023   LDLCALC 33 07/16/2023   LDLDIRECT 60.0 11/06/2017   TRIG 319.0 (H) 07/16/2023   CHOLHDL 4 07/16/2023   Lab Results  Component Value Date   ALT 23 07/16/2023   AST 21 07/16/2023   ALKPHOS 60 07/16/2023   BILITOT 0.7 07/16/2023   GERD with hiatal hernia Treated with Aciphex .  Option to meet with general surgery if worsening symptom control to discuss hiatal hernia and treatment options.  Taking Aciphex  daily.  Monitoring labs today. Notes symptoms if eating late. Treated with Tums. Taking aciphex  daily - usually effective.   Prediabetes: Diet/exercise approach.  Weight has increased from his August visit but had improved previously from 194 in July. Lab Results  Component Value Date   HGBA1C 6.2 07/16/2023   Wt Readings from Last  3 Encounters:  11/22/23 195 lb 6.4 oz (88.6 kg)  08/22/23 185 lb 6.4 oz (84.1 kg)  07/16/23 194 lb 4 oz (88.1 kg)   History of genital HSV Treated with Valtrex  daily. no recent flares.   Tobacco abuse Cigarettes, intermittently has tried to quit previously.  Not currently smoking - quit since 08/04/23.  Referred to lung cancer screening previously, did not call to schedule. Agrees to repeat referral.   History Patient Active Problem List   Diagnosis Date Noted   Encounter for Department of Transportation (DOT) examination for driving license renewal 98/89/7979   Nocturia 05/08/2013   Atherosclerosis of native coronary artery without angina pectoris    Essential hypertension    Dyslipidemia, at goal LDL below 70    Erectile dysfunction    CAD  - S/P urgent CABG x 2 for subtotal occlusion of the LAD in a stented segment that was unable to be recanalized percutaneously 04/02/2009   Past Medical History:  Diagnosis Date   CAD in native artery 10/2003   Essentially LAD occlusion with in-stent restenosis/thrombosis.  50% PDA--referred for CABG x2.   Coronary stent restenosis due to progression of disease 04/2009   Subtotal occlusion of the LAD with ISR, unable to cross with wire; aneurysmal dilation of the LAD prior to stent   Current occasional smoker    He initially quit around the time of his CABG, but he has gotten back to smoking  maybe 1 or 2 a day as a stress relief.  Does not smoke every day.   Dyslipidemia, goal LDL below 70    Erectile dysfunction    History of GI bleed    No recurrence   History of vertigo    Hypertension    S/P CABG x 2 04/2009   LIMA-LAD, SVG-RPDA (to bypass a roughly 50% lesion); post CABG stress normal with no ischemia   ST elevation (STEMI) myocardial infarction involving left anterior descending coronary artery (HCC) 10/2003   LAD PCI with 2 overlapping Cypher DES 3.0 mm 23 mm   Past Surgical History:  Procedure Laterality Date   CARDIAC  CATHETERIZATION  04/28/2009   Subtotal occlusion of mid LAD with ISR/thrombosis, unable to pass --> urgent CABG (Dr. DOROTHA Gouty)   cataract surgery  02/2022   COLONOSCOPY  2005   CORONARY ANGIOPLASTY WITH STENT PLACEMENT  10/26/2003   anterior ischemia with significant reversibility (mid anterior and anteroseptal, apical anterior and apical anteroseptal)  - Cypher 3x29mm stent to mid LAD and Cypher 3x20mm Cypher DES to prox LAD(Dr. FABIENE Hasten)   CORONARY ARTERY BYPASS GRAFT  04/2009   LIMA-LAD, reverse SVG-distal RCA (Dr. GORMAN. Hendrickson)   DOPPLER ECHOCARDIOGRAPHY  04/2009   EF 50-50%, borderline septal hypokinesis   Exercise Tolerance Test  06/21/2013   Exercise 8:05 Min, 10.1 METs, reached 99% max. Peak HR -- 160 bpm; NEGATIVE Bruce TM GXT = no EKG evidence of ischemia. Prolonged heart rate and blood pressure recovery and PVCs noted that resolve with Exerction.   NM MYOVIEW  LTD  05/2018   EF 45%.  Low risk.  No ischemia or infarction.   Allergies  Allergen Reactions   Ace Inhibitors Other (See Comments)    Angioedema 04/05/22.    Prior to Admission medications   Medication Sig Start Date End Date Taking? Authorizing Provider  acetaminophen (TYLENOL) 500 MG tablet Take 500 mg by mouth 2 (two) times daily.   Yes [provider]  aspirin EC 81 MG tablet Take 81 mg by mouth daily.   Yes [provider]  Doxylamine Succinate, Sleep, (SLEEP AID PO) Take by mouth daily. 2 tablets at bedtime.   Yes [provider]  fish oil-omega-3 fatty acids 1000 MG capsule Take 1 g by mouth 2 (two) times daily.    Yes [provider]  losartan  (COZAAR ) 25 MG tablet TAKE 1 TABLET (25 MG TOTAL) BY MOUTH DAILY. 11/07/23  Yes Levora Reyes SAUNDERS, MD  metoprolol  succinate (TOPROL -XL) 50 MG 24 hr tablet TAKE 1 TABLET BY MOUTH EVERY DAY WITH OR IMMEDIATELY FOLLOWING A MEAL 07/16/23  Yes Levora Reyes SAUNDERS, MD  RABEprazole  (ACIPHEX ) 20 MG tablet Take 1 tablet (20 mg total) by mouth  daily. 07/16/23  Yes Levora Reyes SAUNDERS, MD  sildenafil  (VIAGRA ) 100 MG tablet TAKE 0.5-1 TABLETS BY MOUTH DAILY AS NEEDED FOR ERECTILE DYSFUNCTION. 07/16/23  Yes Levora Reyes SAUNDERS, MD  simvastatin  (ZOCOR ) 40 MG tablet TAKE 1 TABLET BY MOUTH EVERY DAY 02/23/23  Yes Levora Reyes SAUNDERS, MD  valACYclovir  (VALTREX ) 500 MG tablet TAKE 1 TABLET BY MOUTH DAILY. INCREASE TO TWICE A DAY DOSING FOR 3 DAYS IF SYMPTOMS OF FLARE. 11/08/23  Yes Levora Reyes SAUNDERS, MD   Social History   Socioeconomic History   Marital status: Divorced    Spouse name: Not on file   Number of children: 1   Years of education: 67   Highest education level: Not on file  Occupational History  Employer: [employer]  Tobacco Use   Smoking status: Every Day    Current packs/day: 0.50    Average packs/day: 0.5 packs/day for 50.0 years (25.0 ttl pk-yrs)    Types: Cigarettes   Smokeless tobacco: Never   Tobacco comments:    12 Cigarettes daily; last attempt at quitting 2005  Vaping Use   Vaping status: Never Used  Substance and Sexual Activity   Alcohol use: Yes    Comment: occas   Drug use: No   Sexual activity: Yes    Birth control/protection: Condom  Other Topics Concern   Not on file  Social History Narrative   Divorced father of one, grandfather 2.  Works for [employer] in [place]. Does not get routine activity/exercise -- Basically, he is not motivated to do any exercise. He, by himself, is admittedly lazy. Does not drink alcohol.   Social Drivers of Corporate Investment Banker Strain: Low Risk  (06/19/2023)   Overall Financial Resource Strain (CARDIA)    Difficulty of Paying Living Expenses: Not very hard  Food Insecurity: No Food Insecurity (06/19/2023)   Hunger Vital Sign    Worried About Running Out of Food in the Last Year: Never true    Ran Out of Food in the Last Year: Never true  Transportation Needs: No Transportation Needs (06/19/2023)   PRAPARE - Scientist, Research (physical Sciences) (Medical): No    Lack of Transportation (Non-Medical): No  Physical Activity: Inactive (06/19/2023)   Exercise Vital Sign    Days of Exercise per Week: 0 days    Minutes of Exercise per Session: 0 min  Stress: No Stress Concern Present (06/19/2023)   [employer] of Occupational Health - Occupational Stress Questionnaire    Feeling of Stress: Not at all  Social Connections: Moderately Isolated (06/19/2023)   Social Connection and Isolation Panel    Frequency of Communication with Friends and Family: Twice a week    Frequency of Social Gatherings with Friends and Family: Twice a week    Attends Religious Services: Never    Database Administrator or Organizations: Yes    Attends Banker Meetings: 1 to 4 times per year    Marital Status: Divorced  Intimate Partner Violence: Not At Risk (06/19/2023)   Humiliation, Afraid, Rape, and Kick questionnaire    Fear of Current or Ex-Partner: No    Emotionally Abused: No    Physically Abused: No    Sexually Abused: No    Review of Systems Per HPI.   Objective:   Vitals:   11/22/23 1522 11/22/23 1608  BP: 98/68 116/68  Pulse: 87   Resp: 20   Temp: 98.2 F (36.8 C)   TempSrc: Temporal   SpO2: 97%   Weight: 195 lb 6.4 oz (88.6 kg)   Height: 5' 7 (1.702 m)      Physical Exam Vitals reviewed.  Constitutional:      Appearance: He is well-developed.  HENT:     Head: Normocephalic and atraumatic.  Neck:     Vascular: No carotid bruit or JVD.  Cardiovascular:     Rate and Rhythm: Normal rate and regular rhythm.     Heart sounds: Normal heart sounds. No murmur heard. Pulmonary:     Effort: Pulmonary effort is normal.     Breath sounds: Normal breath sounds. No rales.  Musculoskeletal:     Right lower leg: No edema.     Left lower leg:  No edema.  Skin:    General: Skin is warm and dry.     Comments: Few round patches on the undersurface of upper arms bilaterally, see photos.  Somewhat dry, flaky  skin lower legs without rash.  Neurological:     Mental Status: He is alert and oriented to person, place, and time.  Psychiatric:        Mood and Affect: Mood normal.          Assessment & Plan:  EBENEZER MCCASKEY is a 68 y.o. male . Essential hypertension - Plan: Comprehensive metabolic panel with GFR, losartan  (COZAAR ) 25 MG tablet, metoprolol  succinate (TOPROL -XL) 50 MG 24 hr tablet  -  Stable, tolerating current regimen. Medications refilled. Labs pending as above.  Improved echo, followed by cardiology.  Rash - Plan: clotrimazole -betamethasone  (LOTRISONE ) cream  - Upper body rash suspicious for tinea corporis.  Trial of Lotrisone  with RTC precautions if not improving.  Consider Derm eval or biopsy.  Dry skin  - Lower legs appear to be dry skin dermatitis, hydrating lotion discussed.  Hiatal hernia Gastroesophageal reflux disease, unspecified whether esophagitis present  -Trigger avoidance discussed, otherwise stable with use of Aciphex .  Prediabetes - Plan: Hemoglobin A1c  - Diet/exercise approach, check A1c and adjust plan accordingly.  History of nicotine use Screening for lung cancer - Plan: Ambulatory Referral Lung Cancer Screening [facility]  - Commended on his smoking cessation and advised to let me know if I can be any assistance.  Handout given on dealing with cravings.  Previously referred for lung cancer screening, agrees on repeat referral and discussed returning call or contact once they reach out to him.  Understanding expressed.  Long-term current use of proton pump inhibitor therapy - Plan: B12, Magnesium  - Check deficiencies with chronic use of PPI.  Atherosclerosis of native coronary artery of native heart without angina pectoris Dyslipidemia, at goal LDL below 70 - Plan: Comprehensive metabolic panel with GFR, Lipid panel CAD  - S/P urgent CABG x 2 for subtotal occlusion of the LAD in a stented segment that was unable to be recanalized  percutaneously - Plan: metoprolol  succinate (TOPROL -XL) 50 MG 24 hr tablet  - Stable, followed by cardiology, labs as above, continue same regimen.  Meds ordered this encounter  Medications   clotrimazole -betamethasone  (LOTRISONE ) cream    Sig: Apply 1 Application topically daily.    Dispense:  30 g    Refill:  0   losartan  (COZAAR ) 25 MG tablet    Sig: Take 1 tablet (25 mg total) by mouth daily.    Dispense:  90 tablet    Refill:  2   metoprolol  succinate (TOPROL -XL) 50 MG 24 hr tablet    Sig: TAKE 1 TABLET BY MOUTH EVERY DAY WITH OR IMMEDIATELY FOLLOWING A MEAL    Dispense:  90 tablet    Refill:  2   simvastatin  (ZOCOR ) 40 MG tablet    Sig: Take 1 tablet (40 mg total) by mouth daily.    Dispense:  90 tablet    Refill:  3   Patient Instructions  Proud of you and quitting smoking. Keep up the good work. See info below for any challenges or cravings.   Try aveeno or eucerin for dry skin or itchy areas in lower legs, but I sent in a new cream to use for the rounded patches in your upper arms that can treat ringworm or fungal infections.  If that rash does not improve in the next few  weeks with this treatment or any new/worsening areas please be seen.   Thank you for coming in today. No other change in medications at this time. If there are any concerns on your bloodwork, I will let you know. Take care!`            Managing the Challenge of Quitting Smoking Quitting smoking is a physical and mental challenge. You may have cravings, withdrawal symptoms, and temptation to smoke. Before quitting, work with your health care provider to make a plan that can help you manage quitting. Making a plan before you quit may keep you from smoking when you have the urge to smoke while trying to quit. How to manage lifestyle changes Managing stress Stress can make you want to smoke, and wanting to smoke may cause stress. It is important to find ways to manage your stress. You could try some of the  following: Practice relaxation techniques. Breathe slowly and deeply, in through your nose and out through your mouth. Listen to music. Soak in a bath or take a shower. Imagine a peaceful place or vacation. Get some support. Talk with family or friends about your stress. Join a support group. Talk with a counselor or therapist. Get some physical activity. Go for a walk, run, or bike ride. Play a favorite sport. Practice yoga.  Medicines Talk with your health care provider about medicines that might help you deal with cravings and make quitting easier for you. Relationships Social situations can be difficult when you are quitting smoking. To manage this, you can: Avoid parties and other social situations where people might be smoking. Avoid alcohol. Leave right away if you have the urge to smoke. Explain to your family and friends that you are quitting smoking. Ask for support and let them know you might be a bit grumpy. Plan activities where smoking is not an option. General instructions Be aware that many people gain weight after they quit smoking. However, not everyone does. To keep from gaining weight, have a plan in place before you quit, and stick to the plan after you quit. Your plan should include: Eating healthy snacks. When you have a craving, it may help to: Eat popcorn, or try carrots, celery, or other cut vegetables. Chew sugar-free gum. Changing how you eat. Eat small portion sizes at meals. Eat 4-6 small meals throughout the day instead of 1-2 large meals a day. Be mindful when you eat. You should avoid watching television or doing other things that might distract you as you eat. Exercising regularly. Make time to exercise each day. If you do not have time for a long workout, do short bouts of exercise for 5-10 minutes several times a day. Do some form of strengthening exercise, such as weight lifting. Do some exercise that gets your heart beating and causes you to  breathe deeply, such as walking fast, running, swimming, or biking. This is very important. Drinking plenty of water or other low-calorie or no-calorie drinks. Drink enough fluid to keep your urine pale yellow.  How to recognize withdrawal symptoms Your body and mind may experience discomfort as you try to get used to not having nicotine in your system. These effects are called withdrawal symptoms. They may include: Feeling hungrier than normal. Having trouble concentrating. Feeling irritable or restless. Having trouble sleeping. Feeling depressed. Craving a cigarette. These symptoms may surprise you, but they are normal to have when quitting smoking. To manage withdrawal symptoms: Avoid places, people, and activities that trigger your  cravings. Remember why you want to quit. Get plenty of sleep. Avoid coffee and other drinks that contain caffeine. These may worsen some of your symptoms. How to manage cravings Come up with a plan for how to deal with your cravings. The plan should include the following: A definition of the specific situation you want to deal with. An activity or action you will take to replace smoking. A clear idea for how this action will help. The name of someone who could help you with this. Cravings usually last for 5-10 minutes. Consider taking the following actions to help you with your plan to deal with cravings: Keep your mouth busy. Chew sugar-free gum. Suck on hard candies or a straw. Brush your teeth. Keep your hands and body busy. Change to a different activity right away. Squeeze or play with a ball. Do an activity or a hobby, such as making bead jewelry, practicing needlepoint, or working with wood. Mix up your normal routine. Take a short exercise break. Go for a quick walk, or run up and down stairs. Focus on doing something kind or helpful for someone else. Call a friend or family member to talk during a craving. Join a support group. Contact a  quitline. Where to find support To get help or find a support group: Call the National Cancer Institute's Smoking Quitline: 1-800-QUIT-NOW 5156348845) Text QUIT to SmokefreeTXT: 521151 Where to find more information Visit these websites to find more information on quitting smoking: U.S. Department of Health and Human Services: www.smokefree.gov American Lung Association: www.freedomfromsmoking.org Centers for Disease Control and Prevention (CDC): footballexhibition.com.br American Heart Association: www.heart.org Contact a health care provider if: You want to change your plan for quitting. The medicines you are taking are not helping. Your eating feels out of control or you cannot sleep. You feel depressed or become very anxious. Summary Quitting smoking is a physical and mental challenge. You will face cravings, withdrawal symptoms, and temptation to smoke again. Preparation can help you as you go through these challenges. Try different techniques to manage stress, handle social situations, and prevent weight gain. You can deal with cravings by keeping your mouth busy (such as by chewing gum), keeping your hands and body busy, calling family or friends, or contacting a quitline for people who want to quit smoking. You can deal with withdrawal symptoms by avoiding places where people smoke, getting plenty of rest, and avoiding drinks that contain caffeine. This information is not intended to replace advice given to you by your health care provider. Make sure you discuss any questions you have with your health care provider. Document Revised: 12/10/2020 Document Reviewed: 12/10/2020 Elsevier Patient Education  2024 Elsevier Inc.    Signed,   Reyes Pines, MD [facility], [facility] 11/22/23 4:21 PM

## 2023-11-22 NOTE — Patient Instructions (Addendum)
 Proud of you and quitting smoking. Keep up the good work. See info below for any challenges or cravings.   Try aveeno or eucerin for dry skin or itchy areas in lower legs, but I sent in a new cream to use for the rounded patches in your upper arms that can treat ringworm or fungal infections.  If that rash does not improve in the next few weeks with this treatment or any new/worsening areas please be seen.   Thank you for coming in today. No other change in medications at this time. If there are any concerns on your bloodwork, I will let you know. Take care!`            Managing the Challenge of Quitting Smoking Quitting smoking is a physical and mental challenge. You may have cravings, withdrawal symptoms, and temptation to smoke. Before quitting, work with your health care provider to make a plan that can help you manage quitting. Making a plan before you quit may keep you from smoking when you have the urge to smoke while trying to quit. How to manage lifestyle changes Managing stress Stress can make you want to smoke, and wanting to smoke may cause stress. It is important to find ways to manage your stress. You could try some of the following: Practice relaxation techniques. Breathe slowly and deeply, in through your nose and out through your mouth. Listen to music. Soak in a bath or take a shower. Imagine a peaceful place or vacation. Get some support. Talk with family or friends about your stress. Join a support group. Talk with a counselor or therapist. Get some physical activity. Go for a walk, run, or bike ride. Play a favorite sport. Practice yoga.  Medicines Talk with your health care provider about medicines that might help you deal with cravings and make quitting easier for you. Relationships Social situations can be difficult when you are quitting smoking. To manage this, you can: Avoid parties and other social situations where people might be smoking. Avoid  alcohol. Leave right away if you have the urge to smoke. Explain to your family and friends that you are quitting smoking. Ask for support and let them know you might be a bit grumpy. Plan activities where smoking is not an option. General instructions Be aware that many people gain weight after they quit smoking. However, not everyone does. To keep from gaining weight, have a plan in place before you quit, and stick to the plan after you quit. Your plan should include: Eating healthy snacks. When you have a craving, it may help to: Eat popcorn, or try carrots, celery, or other cut vegetables. Chew sugar-free gum. Changing how you eat. Eat small portion sizes at meals. Eat 4-6 small meals throughout the day instead of 1-2 large meals a day. Be mindful when you eat. You should avoid watching television or doing other things that might distract you as you eat. Exercising regularly. Make time to exercise each day. If you do not have time for a long workout, do short bouts of exercise for 5-10 minutes several times a day. Do some form of strengthening exercise, such as weight lifting. Do some exercise that gets your heart beating and causes you to breathe deeply, such as walking fast, running, swimming, or biking. This is very important. Drinking plenty of water or other low-calorie or no-calorie drinks. Drink enough fluid to keep your urine pale yellow.  How to recognize withdrawal symptoms Your body and mind may experience  discomfort as you try to get used to not having nicotine in your system. These effects are called withdrawal symptoms. They may include: Feeling hungrier than normal. Having trouble concentrating. Feeling irritable or restless. Having trouble sleeping. Feeling depressed. Craving a cigarette. These symptoms may surprise you, but they are normal to have when quitting smoking. To manage withdrawal symptoms: Avoid places, people, and activities that trigger your  cravings. Remember why you want to quit. Get plenty of sleep. Avoid coffee and other drinks that contain caffeine. These may worsen some of your symptoms. How to manage cravings Come up with a plan for how to deal with your cravings. The plan should include the following: A definition of the specific situation you want to deal with. An activity or action you will take to replace smoking. A clear idea for how this action will help. The name of someone who could help you with this. Cravings usually last for 5-10 minutes. Consider taking the following actions to help you with your plan to deal with cravings: Keep your mouth busy. Chew sugar-free gum. Suck on hard candies or a straw. Brush your teeth. Keep your hands and body busy. Change to a different activity right away. Squeeze or play with a ball. Do an activity or a hobby, such as making bead jewelry, practicing needlepoint, or working with wood. Mix up your normal routine. Take a short exercise break. Go for a quick walk, or run up and down stairs. Focus on doing something kind or helpful for someone else. Call a friend or family member to talk during a craving. Join a support group. Contact a quitline. Where to find support To get help or find a support group: Call the National Cancer Institute's Smoking Quitline: 1-800-QUIT-NOW 559 390 1658) Text QUIT to SmokefreeTXT: 521151 Where to find more information Visit these websites to find more information on quitting smoking: U.S. Department of Health and Human Services: www.smokefree.gov American Lung Association: www.freedomfromsmoking.org Centers for Disease Control and Prevention (CDC): footballexhibition.com.br American Heart Association: www.heart.org Contact a health care provider if: You want to change your plan for quitting. The medicines you are taking are not helping. Your eating feels out of control or you cannot sleep. You feel depressed or become very anxious. Summary Quitting  smoking is a physical and mental challenge. You will face cravings, withdrawal symptoms, and temptation to smoke again. Preparation can help you as you go through these challenges. Try different techniques to manage stress, handle social situations, and prevent weight gain. You can deal with cravings by keeping your mouth busy (such as by chewing gum), keeping your hands and body busy, calling family or friends, or contacting a quitline for people who want to quit smoking. You can deal with withdrawal symptoms by avoiding places where people smoke, getting plenty of rest, and avoiding drinks that contain caffeine. This information is not intended to replace advice given to you by your health care provider. Make sure you discuss any questions you have with your health care provider. Document Revised: 12/10/2020 Document Reviewed: 12/10/2020 Elsevier Patient Education  2024 [employer].

## 2023-11-23 LAB — COMPREHENSIVE METABOLIC PANEL WITH GFR
ALT: 35 U/L (ref 0–53)
AST: 25 U/L (ref 0–37)
Albumin: 4.4 g/dL (ref 3.5–5.2)
Alkaline Phosphatase: 56 U/L (ref 39–117)
BUN: 18 mg/dL (ref 6–23)
CO2: 24 meq/L (ref 19–32)
Calcium: 9.7 mg/dL (ref 8.4–10.5)
Chloride: 106 meq/L (ref 96–112)
Creatinine, Ser: 0.91 mg/dL (ref 0.40–1.50)
GFR: 86.38 mL/min (ref >60.00)
Glucose, Bld: 158 mg/dL — ABNORMAL HIGH (ref 70–99)
Potassium: 4 meq/L (ref 3.5–5.1)
Sodium: 139 meq/L (ref 135–145)
Total Bilirubin: 0.9 mg/dL (ref 0.2–1.2)
Total Protein: 6.9 g/dL (ref 6.0–8.3)

## 2023-11-23 LAB — VITAMIN B12: Vitamin B-12: 158 pg/mL — ABNORMAL LOW (ref 211–911)

## 2023-11-23 LAB — LIPID PANEL
Cholesterol: 123 mg/dL (ref 0–200)
HDL: 31 mg/dL — ABNORMAL LOW (ref >39.00)
LDL Cholesterol: 21 mg/dL (ref 0–99)
NonHDL: 92.16
Total CHOL/HDL Ratio: 4
Triglycerides: 354 mg/dL — ABNORMAL HIGH (ref 0.0–149.0)
VLDL: 70.8 mg/dL — ABNORMAL HIGH (ref 0.0–40.0)

## 2023-11-23 LAB — HEMOGLOBIN A1C: Hgb A1c MFr Bld: 5.8 % (ref 4.6–6.5)

## 2023-11-23 LAB — MAGNESIUM: Magnesium: 1.9 mg/dL (ref 1.5–2.5)

## 2023-11-27 ENCOUNTER — Ambulatory Visit: Payer: Self-pay | Admitting: Family Medicine

## 2023-12-07 ENCOUNTER — Telehealth: Payer: Self-pay | Admitting: Acute Care

## 2023-12-07 DIAGNOSIS — Z122 Encounter for screening for malignant neoplasm of respiratory organs: Secondary | ICD-10-CM

## 2023-12-07 DIAGNOSIS — Z87891 Personal history of nicotine dependence: Secondary | ICD-10-CM

## 2023-12-07 NOTE — Telephone Encounter (Signed)
 Lung Cancer Screening Narrative/Criteria Questionnaire (Cigarette Smokers Only- No Cigars/Pipes/vapes)   [name] Benitez   SDMV:12/19/2023 10:45a [name]       August 09, 1955   LDCT: 12/20/2023 4:30p [name]    68 y.o.   Phone: [phone]  Lung Screening Narrative (confirm age 79-77 yrs Medicare / 50-80 yrs Private pay insurance)   Insurance information:HTA   Referring Provider:Dr. Levora   This screening involves an initial phone call with a team member from our program. It is called a shared decision making visit. The initial meeting is required by  insurance and Medicare to make sure you understand the program. This appointment takes about 15-20 minutes to complete. You will complete the screening scan at your scheduled date/time.  This scan takes about 5-10 minutes to complete. You can eat and drink normally before and after the scan.  Criteria questions for Lung Cancer Screening:   Are you a current or former smoker? Former Age began smoking: 68yo   If you are a former smoker, what year did you quit smoking? 08/2023, patient also quit a couple times, once for 11 years another time for 14 years (within 15 yrs)   To calculate your smoking history, I need an accurate estimate of how many packs of cigarettes you smoked per day and for how many years. (Not just the number of PPD you are now smoking)   Years smoking 27 x Packs per day 1 = Pack years 27   (at least 20 pack yrs)   (Make sure they understand that we need to know how much they have smoked in the past, not just the number of PPD they are smoking now)  Do you have a personal history of cancer?  No    Do you have a family history of cancer? No  Are you coughing up blood?  No  Have you had unexplained weight loss of 15 lbs or more in the last 6 months? No  It looks like you meet all criteria.  When would be a good time for us  to schedule you for this screening?   Additional information: N/A

## 2023-12-19 ENCOUNTER — Ambulatory Visit: Admitting: Adult Health

## 2023-12-19 ENCOUNTER — Encounter: Payer: Self-pay | Admitting: Adult Health

## 2023-12-19 DIAGNOSIS — Z87891 Personal history of nicotine dependence: Secondary | ICD-10-CM

## 2023-12-19 NOTE — Patient Instructions (Signed)

## 2023-12-19 NOTE — Progress Notes (Signed)
°  Virtual Visit via Telephone Note  I connected with [name] Benitez , 12/19/2023 10:58 AM by a telemedicine application and verified that I am speaking with the correct person using two identifiers.  Location: Patient: home Provider: home   I discussed the limitations of evaluation and management by telemedicine and the availability of in person appointments. The patient expressed understanding and agreed to proceed.   Shared Decision Making Visit Lung Cancer Screening Program [phone])   Eligibility: 68 y.o. Pack Years Smoking History Calculation = 27 pack years  (# packs/per year x # years smoked) Recent History of coughing up blood  no Unexplained weight loss? no ( >Than 15 pounds within the last 6 months ) Prior History Lung / other cancer no (Diagnosis within the last 5 years already requiring surveillance chest CT Scans). Smoking Status Former Smoker Former Smokers: Years since quit: < 1 year  Quit Date: 08/2023  Visit Components: Discussion included one or more decision making aids. YES Discussion included risk/benefits of screening. YES Discussion included potential follow up diagnostic testing for abnormal scans. YES Discussion included meaning and risk of over diagnosis. YES Discussion included meaning and risk of False Positives. YES Discussion included meaning of total radiation exposure. YES  Counseling Included: Importance of adherence to annual lung cancer LDCT screening. YES Impact of comorbidities on ability to participate in the program. YES Ability and willingness to under diagnostic treatment. YES  Smoking Cessation Counseling: Former Smokers:  Discussed the importance of maintaining cigarette abstinence. yes Diagnosis Code: Personal History of Nicotine Dependence. S12.108 Information about tobacco cessation classes and interventions provided to patient. Yes Patient provided with ticket for LDCT Scan. yes Written Order for Lung Cancer Screening  with LDCT placed in Epic. Yes (CT Chest Lung Cancer Screening Low Dose W/O CM) [id number]   Z12.2-Screening of respiratory organs Z87.891-Personal history of nicotine dependence   [name] Benitez 12/19/2023

## 2023-12-20 ENCOUNTER — Ambulatory Visit (HOSPITAL_BASED_OUTPATIENT_CLINIC_OR_DEPARTMENT_OTHER): Admission: RE | Admit: 2023-12-20 | Discharge: 2023-12-20 | Attending: Acute Care | Admitting: Acute Care

## 2023-12-20 DIAGNOSIS — Z87891 Personal history of nicotine dependence: Secondary | ICD-10-CM | POA: Insufficient documentation

## 2023-12-20 DIAGNOSIS — Z122 Encounter for screening for malignant neoplasm of respiratory organs: Secondary | ICD-10-CM | POA: Insufficient documentation

## 2023-12-29 ENCOUNTER — Other Ambulatory Visit: Payer: Self-pay

## 2023-12-29 ENCOUNTER — Encounter (HOSPITAL_BASED_OUTPATIENT_CLINIC_OR_DEPARTMENT_OTHER): Payer: Self-pay | Admitting: Emergency Medicine

## 2023-12-29 ENCOUNTER — Emergency Department (HOSPITAL_BASED_OUTPATIENT_CLINIC_OR_DEPARTMENT_OTHER)
Admission: EM | Admit: 2023-12-29 | Discharge: 2023-12-29 | Disposition: A | Discharge status: Home / Self Care | Attending: Emergency Medicine | Admitting: Emergency Medicine

## 2023-12-29 DIAGNOSIS — G510 Bell's palsy: Secondary | ICD-10-CM | POA: Insufficient documentation

## 2023-12-29 DIAGNOSIS — Z7982 Long term (current) use of aspirin: Secondary | ICD-10-CM | POA: Insufficient documentation

## 2023-12-29 LAB — CBG MONITORING, ED: Glucose-Capillary: 118 mg/dL — ABNORMAL HIGH (ref 70–99)

## 2023-12-29 MED ORDER — VALACYCLOVIR HCL 1 G PO TABS: 1000.0000 mg | ORAL_TABLET | Freq: Three times a day (TID) | ORAL | 0 refills | Status: AC

## 2023-12-29 MED ORDER — PREDNISONE 20 MG PO TABS: 60.0000 mg | ORAL_TABLET | Freq: Every day | ORAL | 0 refills | Status: AC

## 2023-12-29 NOTE — ED Provider Notes (Signed)
 " [place] EMERGENCY DEPARTMENT AT [facility] Provider Note   CSN: [id number] Arrival date & time: 12/29/23  8197     Patient presents with: Facial Droop   [name] Benitez is a 68 y.o. male.   Pleasant 68 year old male here today for left-sided facial droop.  Patient states that he started noticed this drooping beginning yesterday evening.  He says that this morning he noticed that when he was taking his morning pills, things were falling out of the left side of his mouth.  He has not had any numbness, tingling, weakness or difficulty walking.        Prior to Admission medications  Medication Sig Start Date End Date Taking? Authorizing Provider  predniSONE  (DELTASONE ) 20 MG tablet Take 3 tablets (60 mg total) by mouth daily for 7 days. 12/29/23 01/05/24 Yes Mannie Pac T, DO  valACYclovir  (VALTREX ) 1000 MG tablet Take 1 tablet (1,000 mg total) by mouth 3 (three) times daily. 12/29/23  Yes Mannie Pac T, DO  acetaminophen (TYLENOL) 500 MG tablet Take 500 mg by mouth 2 (two) times daily.    [provider]  aspirin EC 81 MG tablet Take 81 mg by mouth daily.    [provider]  clotrimazole -betamethasone  (LOTRISONE ) cream Apply 1 Application topically daily. 11/22/23   Levora Reyes SAUNDERS, MD  Doxylamine Succinate, Sleep, (SLEEP AID PO) Take by mouth daily. 2 tablets at bedtime.    [provider]  fish oil-omega-3 fatty acids 1000 MG capsule Take 1 g by mouth 2 (two) times daily.     [provider]  losartan  (COZAAR ) 25 MG tablet Take 1 tablet (25 mg total) by mouth daily. 11/22/23   Levora Reyes SAUNDERS, MD  metoprolol  succinate (TOPROL -XL) 50 MG 24 hr tablet TAKE 1 TABLET BY MOUTH EVERY DAY WITH OR IMMEDIATELY FOLLOWING A MEAL 11/22/23   Levora Reyes SAUNDERS, MD  RABEprazole  (ACIPHEX ) 20 MG tablet Take 1 tablet (20 mg total) by mouth daily. 07/16/23   Levora Reyes SAUNDERS, MD  sildenafil  (VIAGRA ) 100 MG tablet TAKE 0.5-1 TABLETS BY MOUTH  DAILY AS NEEDED FOR ERECTILE DYSFUNCTION. 07/16/23   Levora Reyes SAUNDERS, MD  simvastatin  (ZOCOR ) 40 MG tablet Take 1 tablet (40 mg total) by mouth daily. 11/22/23   Levora Reyes SAUNDERS, MD    Allergies: Ace inhibitors    Review of Systems  Updated Vital Signs BP (!) 118/102   Pulse 73   Temp 97.8 F (36.6 C)   Resp 18   SpO2 94%   Physical Exam Vitals and nursing note reviewed.  HENT:     Head: Normocephalic.  Eyes:     Pupils: Pupils are equal, round, and reactive to light.  Cardiovascular:     Rate and Rhythm: Normal rate.  Pulmonary:     Effort: Pulmonary effort is normal.  Musculoskeletal:        General: Normal range of motion.     Cervical back: Normal range of motion and neck supple.  Lymphadenopathy:     Cervical: No cervical adenopathy.  Neurological:     Mental Status: He is alert.     Comments: Left-sided facial droop.  Patient unable to raise eyebrow on the left side.  Cranial nerves are otherwise intact.  Patient has 5 out of 5 strength with shoulder shrug, grip strength, straight leg raise bilaterally.  He has no numbness or tingling.  Normal gait.     (all labs ordered are listed, but only abnormal results are displayed) Labs  Reviewed  CBG MONITORING, ED - Abnormal; Notable for the following components:      Result Value   Glucose-Capillary 118 (*)    All other components within normal limits    EKG: None  Radiology: No results found.   Procedures   Medications Ordered in the ED - No data to display                                  Medical Decision Making 68 year old male here today with left-sided facial droop.  Differential diagnoses include Bell's palsy, consider CVA.  Plan-patient's symptoms are consistent with a Bell's palsy.  He has left-sided facial paralysis which does involve the eyebrow.  Given his otherwise benign neurological exam, he does not require imaging.  I do not see any evidence of zoster on my exam. Will treat patient for  presumed Bell's palsy with steroids and valacyclovir .        Final diagnoses:  Bell's palsy    ED Discharge Orders          Ordered    predniSONE  (DELTASONE ) 20 MG tablet  Daily        12/29/23 1858    valACYclovir  (VALTREX ) 1000 MG tablet  3 times daily        12/29/23 1858               Mannie Pac T, DO 12/29/23 1858  "

## 2023-12-29 NOTE — Discharge Instructions (Addendum)
 Tape your eye shut at night.  Take 60 mg of prednisone  for 1 week.  Take 1000 mg of valacyclovir  3 times per day.  Follow-up with your primary care doctor within 1 to 2 weeks.  Return to the emergency room if you develop weakness in your arms, legs or trouble walking.

## 2023-12-29 NOTE — ED Triage Notes (Signed)
 Left side facial droop. Noticed yesterday No other neuro s/s

## 2023-12-31 ENCOUNTER — Other Ambulatory Visit: Payer: Self-pay

## 2023-12-31 DIAGNOSIS — Z122 Encounter for screening for malignant neoplasm of respiratory organs: Secondary | ICD-10-CM

## 2023-12-31 DIAGNOSIS — Z87891 Personal history of nicotine dependence: Secondary | ICD-10-CM

## 2024-01-08 ENCOUNTER — Telehealth: Payer: Self-pay | Admitting: Family Medicine

## 2024-01-08 NOTE — Telephone Encounter (Signed)
 Patient showed up at 4:10 pm on 01/08/24, stating he was in the ER on 12/29/23 and the ER doctor told him he has Bells Palsy, he was also told to follow up with his primary care doctor, I was trying to schedule a hospital follow up but there is no where to put him before the 2 weeks is up there is a schedule on the 1/14 @ 11:20 am. 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 I told him I will speak with you and get back with him tomorrow. His number is [phone]

## 2024-01-09 NOTE — Telephone Encounter (Signed)
 Any available acute appt is fine. Thanks.

## 2024-01-09 NOTE — Telephone Encounter (Signed)
 He is on the schedule for tomorrow at 4:20 pm

## 2024-01-09 NOTE — Telephone Encounter (Signed)
 Please advise on when we can schedule ED follow up. There is nothing available within two weeks for patient to be seen.

## 2024-01-10 ENCOUNTER — Ambulatory Visit: Admitting: Family Medicine

## 2024-01-10 ENCOUNTER — Encounter: Payer: Self-pay | Admitting: Family Medicine

## 2024-01-10 VITALS — BP 110/78 | HR 65 | Temp 98.5°F | Resp 16 | Ht 67.0 in | Wt 192.0 lb

## 2024-01-10 DIAGNOSIS — J340 Abscess, furuncle and carbuncle of nose: Secondary | ICD-10-CM

## 2024-01-10 DIAGNOSIS — G510 Bell's palsy: Secondary | ICD-10-CM

## 2024-01-10 MED ORDER — DOXYCYCLINE HYCLATE 100 MG PO TABS: 100.0000 mg | ORAL_TABLET | Freq: Two times a day (BID) | ORAL | 0 refills | Status: AC

## 2024-01-10 MED ORDER — MUPIROCIN 2 % EX OINT: 1.0000 | TOPICAL_OINTMENT | Freq: Two times a day (BID) | CUTANEOUS | 0 refills | Status: AC

## 2024-01-10 NOTE — Progress Notes (Unsigned)
 "  Subjective:  Patient ID: [name] Benitez, male    DOB: 1955-10-15  Age: 69 y.o. MRN: [id number]  CC:  Chief Complaint  Patient presents with   Transitions Of Care    Dx bell's palsy. Left side of his face has dropped. Left eye dropping. Some slurred speech. Holding his left side of his face up helps his speech. He feels like the left side of his face is not working. ED 12/27    HPI OZRO RUSSETT presents for   ER follow-up for recent diagnosis of Bell's palsy ED visit 12/29/2023 noted.  Left-sided facial droop noted day prior.  No numbness tingling weakness or difficulty walking.  Left facial paralysis that did involve the eyebrow.  Otherwise benign neurologic exam, determined to not require imaging.  No evidence of zoster on exam.  He was started on steroids and valacyclovir .  Valtrex  1000 mg 3 times daily for 7 days.  Prednisone  60 mg daily for 7 days.  Since ER visit. Minimal change in movement. Unable to completely close eye on left - feels like able to close eye a little better.  No eye pain or redness. Having to assist left mouth to assist with eating.  Has completed prednisone , and valtrex .  Optho - no recent visit. Using dry eye gtts during the day - no nighttime rx.   Bump inside nose noted yesterday - small bump popped this morning. Small amount of white pus expressed - like a pimple. No blisters or other areas of rash.           History Patient Active Problem List   Diagnosis Date Noted   Encounter for Department of Transportation (DOT) examination for driving license renewal 98/89/7979   Nocturia 05/08/2013   Atherosclerosis of native coronary artery without angina pectoris    Essential hypertension    Dyslipidemia, at goal LDL below 70    Erectile dysfunction    CAD  - S/P urgent CABG x 2 for subtotal occlusion of the LAD in a stented segment that was unable to be recanalized percutaneously 04/02/2009   Past Medical History:  Diagnosis Date    CAD in native artery 10/2003   Essentially LAD occlusion with in-stent restenosis/thrombosis.  50% PDA--referred for CABG x2.   Coronary stent restenosis due to progression of disease 04/2009   Subtotal occlusion of the LAD with ISR, unable to cross with wire; aneurysmal dilation of the LAD prior to stent   Current occasional smoker    He initially quit around the time of his CABG, but he has gotten back to smoking maybe 1 or 2 a day as a stress relief.  Does not smoke every day.   Dyslipidemia, goal LDL below 70    Erectile dysfunction    History of GI bleed    No recurrence   History of vertigo    Hypertension    S/P CABG x 2 04/2009   LIMA-LAD, SVG-RPDA (to bypass a roughly 50% lesion); post CABG stress normal with no ischemia   ST elevation (STEMI) myocardial infarction involving left anterior descending coronary artery (HCC) 10/2003   LAD PCI with 2 overlapping Cypher DES 3.0 mm 23 mm   Past Surgical History:  Procedure Laterality Date   CARDIAC CATHETERIZATION  04/28/2009   Subtotal occlusion of mid LAD with ISR/thrombosis, unable to pass --> urgent CABG (Dr. DOROTHA Gouty)   cataract surgery  02/2022   COLONOSCOPY  2005   CORONARY ANGIOPLASTY WITH STENT PLACEMENT  10/26/2003  anterior ischemia with significant reversibility (mid anterior and anteroseptal, apical anterior and apical anteroseptal)  - Cypher 3x74mm stent to mid LAD and Cypher 3x81mm Cypher DES to prox LAD(Dr. FABIENE Hasten)   CORONARY ARTERY BYPASS GRAFT  04/2009   LIMA-LAD, reverse SVG-distal RCA (Dr. GORMAN. Hendrickson)   DOPPLER ECHOCARDIOGRAPHY  04/2009   EF 50-50%, borderline septal hypokinesis   Exercise Tolerance Test  06/21/2013   Exercise 8:05 Min, 10.1 METs, reached 99% max. Peak HR -- 160 bpm; NEGATIVE Bruce TM GXT = no EKG evidence of ischemia. Prolonged heart rate and blood pressure recovery and PVCs noted that resolve with Exerction.   NM MYOVIEW  LTD  05/2018   EF 45%.  Low risk.  No ischemia  or infarction.   Allergies[1] Prior to Admission medications  Medication Sig Start Date End Date Taking? Authorizing Provider  acetaminophen (TYLENOL) 500 MG tablet Take 500 mg by mouth 2 (two) times daily.   Yes [provider]  aspirin EC 81 MG tablet Take 81 mg by mouth daily.   Yes [provider]  clotrimazole -betamethasone  (LOTRISONE ) cream Apply 1 Application topically daily. 11/22/23  Yes Levora Reyes SAUNDERS, MD  Doxylamine Succinate, Sleep, (SLEEP AID PO) Take by mouth daily. 2 tablets at bedtime.   Yes [provider]  fish oil-omega-3 fatty acids 1000 MG capsule Take 1 g by mouth 2 (two) times daily.    Yes [provider]  losartan  (COZAAR ) 25 MG tablet Take 1 tablet (25 mg total) by mouth daily. 11/22/23  Yes Levora Reyes SAUNDERS, MD  metoprolol  succinate (TOPROL -XL) 50 MG 24 hr tablet TAKE 1 TABLET BY MOUTH EVERY DAY WITH OR IMMEDIATELY FOLLOWING A MEAL 11/22/23  Yes Levora Reyes SAUNDERS, MD  RABEprazole  (ACIPHEX ) 20 MG tablet Take 1 tablet (20 mg total) by mouth daily. 07/16/23  Yes Levora Reyes SAUNDERS, MD  sildenafil  (VIAGRA ) 100 MG tablet TAKE 0.5-1 TABLETS BY MOUTH DAILY AS NEEDED FOR ERECTILE DYSFUNCTION. 07/16/23  Yes Levora Reyes SAUNDERS, MD  simvastatin  (ZOCOR ) 40 MG tablet Take 1 tablet (40 mg total) by mouth daily. 11/22/23  Yes Levora Reyes SAUNDERS, MD  valACYclovir  (VALTREX ) 1000 MG tablet Take 1 tablet (1,000 mg total) by mouth 3 (three) times daily. 12/29/23  Yes Mannie Fairy DASEN, DO   Social History   Socioeconomic History   Marital status: Divorced    Spouse name: Not on file   Number of children: 1   Years of education: 11   Highest education level: Not on file  Occupational History    Employer: [employer]  Tobacco Use   Smoking status: Every Day    Current packs/day: 0.50    Average packs/day: 0.5 packs/day for 50.0 years (25.0 ttl pk-yrs)    Types: Cigarettes   Smokeless tobacco: Never   Tobacco comments:    12  Cigarettes daily; last attempt at quitting 2005  Vaping Use   Vaping status: Never Used  Substance and Sexual Activity   Alcohol use: Yes    Comment: occas   Drug use: No   Sexual activity: Yes    Birth control/protection: Condom  Other Topics Concern   Not on file  Social History Narrative   Divorced father of one, grandfather 2.  Works for [employer] in [place]. Does not get routine activity/exercise -- Basically, he is not motivated to do any exercise. He, by himself, is admittedly lazy. Does not drink alcohol.   Social Drivers of Health   Tobacco Use: High  Risk (01/10/2024)   Patient History    Smoking Tobacco Use: Every Day    Smokeless Tobacco Use: Never    Passive Exposure: Not on file  Financial Resource Strain: Low Risk (06/19/2023)   Overall Financial Resource Strain (CARDIA)    Difficulty of Paying Living Expenses: Not very hard  Food Insecurity: No Food Insecurity (06/19/2023)   Epic    Worried About Programme Researcher, Broadcasting/film/video in the Last Year: Never true    Ran Out of Food in the Last Year: Never true  Transportation Needs: No Transportation Needs (06/19/2023)   Epic    Lack of Transportation (Medical): No    Lack of Transportation (Non-Medical): No  Physical Activity: Inactive (06/19/2023)   Exercise Vital Sign    Days of Exercise per Week: 0 days    Minutes of Exercise per Session: 0 min  Stress: No Stress Concern Present (06/19/2023)   [employer] of Occupational Health - Occupational Stress Questionnaire    Feeling of Stress: Not at all  Social Connections: Moderately Isolated (06/19/2023)   Social Connection and Isolation Panel    Frequency of Communication with Friends and Family: Twice a week    Frequency of Social Gatherings with Friends and Family: Twice a week    Attends Religious Services: Never    Database Administrator or Organizations: Yes    Attends Banker Meetings: 1 to 4 times per year    Marital  Status: Divorced  Intimate Partner Violence: Not At Risk (06/19/2023)   Epic    Fear of Current or Ex-Partner: No    Emotionally Abused: No    Physically Abused: No    Sexually Abused: No  Depression (PHQ2-9): Low Risk (11/22/2023)   Depression (PHQ2-9)    PHQ-2 Score: 0  Alcohol Screen: Low Risk (06/19/2023)   Alcohol Screen    Last Alcohol Screening Score (AUDIT): 2  Housing: Unknown (06/19/2023)   Epic    Unable to Pay for Housing in the Last Year: No    Number of Times Moved in the Last Year: Not on file    Homeless in the Last Year: No  Utilities: Not At Risk (06/19/2023)   Epic    Threatened with loss of utilities: No  Health Literacy: Adequate Health Literacy (06/19/2023)   B1300 Health Literacy    Frequency of need for help with medical instructions: Never    Review of Systems   Objective:   Vitals:   01/10/24 1626  BP: 110/78  Pulse: 65  Resp: 16  Temp: 98.5 F (36.9 C)  TempSrc: Temporal  SpO2: 97%  Weight: 192 lb (87.1 kg)  Height: 5' 7 (1.702 m)     Physical Exam Vitals reviewed.  Constitutional:      General: He is not in acute distress.    Appearance: Normal appearance. He is well-developed.  HENT:     Head: Normocephalic and atraumatic.  Cardiovascular:     Rate and Rhythm: Normal rate.  Pulmonary:     Effort: Pulmonary effort is normal.  Neurological:     Mental Status: He is alert and oriented to person, place, and time.  Psychiatric:        Mood and Affect: Mood normal.             Assessment & Plan:  SCHNEIDER WARCHOL is a 69 y.o. male . No diagnosis found.   No orders of the defined types were placed in this encounter.  There are no  Patient Instructions on file for this visit.    Signed,   Reyes Pines, MD [facility], [facility] 01/10/2024 4:48 PM        [1] Allergies Allergen Reactions   Ace Inhibitors Other (See Comments)    Angioedema 04/05/22.    "

## 2024-01-10 NOTE — Patient Instructions (Addendum)
 Sorry to hear about the recent development of Bell's Palsy.  Artificial eye drops like systane are ok during the day, add lacrilube (Refresh lacrilube or systane nighttime) at night to help protect your eye until discussed with eye doctor - call for appointment tomorrow and let them know you have Bell's palsy and I want them to see you right away if possible.  If they are unable to see you, let me know and I will get you seen by an eye doctor.  It appears that you have infection inside the left nostril.  That may be forming an abscess or collection of pus.  Since it has already opened earlier today we can try treating that with antibiotics.  Apply the antibiotic ointment twice per day and take the antibiotic pill twice per day until I recheck with you on Monday.  However in the meantime if you notice any increased swelling spreading redness or worsening symptoms be seen by any provider including urgent care if needed.   Will continue to monitor for hopefully some improvement in the muscle movement but that may take some time.  Happy to refer you to neurology at any point.      Bell's Palsy in Adults: What to Know  Bell's palsy is a condition that causes short-term weakness or paralysis of the muscles in a part of your face. With paralysis, you're not able to move the affected muscles. Bell's palsy happens when a nerve in your face, called the seventh cranial nerve, gets irritated, swollen, or has pressure put on it. This nerve runs along your skull, under your ear, and to the side of your face. It controls movements of your face like blinking, closing your eyes, smiling, and frowning. The symptoms of Bell's palsy may go away on their own in a few weeks. Treatment is sometimes needed. What are the causes? The exact cause of Bell's palsy isn't known. It may be caused by irritation and swelling or an infection from a virus, such as the chickenpox (herpes zoster), Epstein-Barr, or mumps virus. What  increases the risk? You're more likely to get Bell's palsy if you: Are pregnant. Have diabetes. Have high blood pressure. Are obese. Have recently had an infection in your nose, throat, or upper airways. Other things that may trigger this condition include: A virus that's been in your body but hasn't been active. A weakened immune system, which is your body's defense system. It may be weak due to: Stress. Not getting enough sleep. Physical trauma or injury. A minor illness. Autoimmune syndromes. Infection or irritation and swelling of the facial nerve. Damage to the covering of the nerve fibers. What are the signs or symptoms? Symptoms of Bell's palsy include: Sudden weakness on one side of the face. Drooping of the eyelid, eyebrow, and corner of the mouth. Drooling from one side of the mouth. Having trouble closing the eyelid. If you're living with Bell's palsy, you may also develop: Pain or unusual feelings in your face. A lot of tearing in one eye. Changes in how things taste. Being sensitive to sound in one ear. Pain behind one ear. Pain around one side of the jaw. Trouble eating or drinking. Most of the time, only one side of the face is affected. In rare cases, Bell's palsy may affect the whole face. How is this diagnosed? Diagnosis of Bell's palsy will include a physical exam. Your symptoms and medical history will be checked. You may also need to see health care providers who are  specialists. This may be an expert in nerve disorders called a neurologist or an eye specialist called an ophthalmologist. You may have tests, such as: An electromyogram. This test checks for nerve damage. Imaging tests, such as a CT scan or an MRI. Blood tests. How is this treated? Bell's palsy affects every person differently. Sometimes symptoms go away without treatment within a few weeks. If treatment is needed, it varies from person to person. The goal of treatment is to reduce irritation  and swelling and to protect the eye from damage. Treatment for Bell's palsy may include: Medicines, such as: Steroids to reduce irritation and swelling. Antiviral medicines. Medicines for pain, like aspirin, acetaminophen, or ibuprofen. Eye drops or ointment to keep your eye moist. Eye protection, if you can't close your eyelid. Physical therapy exercises to improve movement and strength in your face muscles. This may also include acupuncture or massage. Follow these instructions at home:  Take your medicines only as told. If your eye is affected: Keep your eye moist with eye drops or ointment as told by your provider. Follow your provider's instructions for eye care and protection. Do any physical therapy exercises as told. Where to find more information American Academy of Ophthalmology (AAO): seetennis.com.ee Contact a health care provider if: You have a fever or chills. Your symptoms don't get better within 2-3 weeks, or your symptoms get worse. Your eye is red, irritated, or painful. You have new symptoms. You feel light-headed. Get help right away if: You have weakness or numbness in a part of your body other than your face. You have trouble swallowing. You have neck pain or stiffness. You have shortness of breath. These symptoms may be an emergency. Call 911 right away. Do not wait to see if the symptoms will go away. Do not drive yourself to the hospital. This information is not intended to replace advice given to you by your health care provider. Make sure you discuss any questions you have with your health care provider. Document Revised: 05/22/2022 Document Reviewed: 05/22/2022 Elsevier Patient Education  2024 [employer].

## 2024-01-17 ENCOUNTER — Ambulatory Visit: Admitting: Family Medicine

## 2024-05-28 ENCOUNTER — Encounter: Admitting: Family Medicine

## 2024-06-24 ENCOUNTER — Encounter
